# Patient Record
Sex: Female | Born: 1935
Health system: Southern US, Community
[De-identification: ages and names within clinical notes are randomized; demographics above are authoritative.]

## PROBLEM LIST (undated history)

## (undated) DIAGNOSIS — G309 Alzheimer's disease, unspecified: Secondary | ICD-10-CM

## (undated) DIAGNOSIS — I10 Essential (primary) hypertension: Secondary | ICD-10-CM

## (undated) DIAGNOSIS — C50919 Malignant neoplasm of unspecified site of unspecified female breast: Secondary | ICD-10-CM

## (undated) DIAGNOSIS — F0280 Dementia in other diseases classified elsewhere without behavioral disturbance: Secondary | ICD-10-CM

## (undated) DIAGNOSIS — I4819 Other persistent atrial fibrillation: Secondary | ICD-10-CM

## (undated) DIAGNOSIS — G459 Transient cerebral ischemic attack, unspecified: Secondary | ICD-10-CM

## (undated) HISTORY — PX: BREAST SURGERY: SHX581

## (undated) HISTORY — DX: Alzheimer's disease, unspecified: G30.9

## (undated) HISTORY — DX: Other persistent atrial fibrillation: I48.19

## (undated) HISTORY — DX: Dementia in other diseases classified elsewhere without behavioral disturbance: F02.80

## (undated) HISTORY — DX: Transient cerebral ischemic attack, unspecified: G45.9

## (undated) HISTORY — DX: Essential (primary) hypertension: I10

---

## 1993-07-06 HISTORY — PX: MASTECTOMY: SHX3

## 2007-07-07 DIAGNOSIS — G459 Transient cerebral ischemic attack, unspecified: Secondary | ICD-10-CM

## 2007-07-07 HISTORY — DX: Transient cerebral ischemic attack, unspecified: G45.9

## 2012-06-07 DIAGNOSIS — Z23 Encounter for immunization: Secondary | ICD-10-CM | POA: Diagnosis not present

## 2012-06-07 DIAGNOSIS — G609 Hereditary and idiopathic neuropathy, unspecified: Secondary | ICD-10-CM | POA: Diagnosis not present

## 2012-06-07 DIAGNOSIS — R030 Elevated blood-pressure reading, without diagnosis of hypertension: Secondary | ICD-10-CM | POA: Diagnosis not present

## 2012-06-08 DIAGNOSIS — R030 Elevated blood-pressure reading, without diagnosis of hypertension: Secondary | ICD-10-CM | POA: Diagnosis not present

## 2012-06-08 DIAGNOSIS — G609 Hereditary and idiopathic neuropathy, unspecified: Secondary | ICD-10-CM | POA: Diagnosis not present

## 2012-06-15 DIAGNOSIS — Z23 Encounter for immunization: Secondary | ICD-10-CM | POA: Diagnosis not present

## 2012-10-24 DIAGNOSIS — D539 Nutritional anemia, unspecified: Secondary | ICD-10-CM | POA: Diagnosis not present

## 2012-10-24 DIAGNOSIS — R29818 Other symptoms and signs involving the nervous system: Secondary | ICD-10-CM | POA: Diagnosis not present

## 2012-11-02 DIAGNOSIS — R29818 Other symptoms and signs involving the nervous system: Secondary | ICD-10-CM | POA: Diagnosis not present

## 2012-11-02 DIAGNOSIS — R262 Difficulty in walking, not elsewhere classified: Secondary | ICD-10-CM | POA: Diagnosis not present

## 2012-11-09 DIAGNOSIS — H04129 Dry eye syndrome of unspecified lacrimal gland: Secondary | ICD-10-CM | POA: Diagnosis not present

## 2012-11-09 DIAGNOSIS — R262 Difficulty in walking, not elsewhere classified: Secondary | ICD-10-CM | POA: Diagnosis not present

## 2012-11-09 DIAGNOSIS — R29818 Other symptoms and signs involving the nervous system: Secondary | ICD-10-CM | POA: Diagnosis not present

## 2012-11-11 DIAGNOSIS — R29818 Other symptoms and signs involving the nervous system: Secondary | ICD-10-CM | POA: Diagnosis not present

## 2012-11-11 DIAGNOSIS — R262 Difficulty in walking, not elsewhere classified: Secondary | ICD-10-CM | POA: Diagnosis not present

## 2012-11-16 DIAGNOSIS — R262 Difficulty in walking, not elsewhere classified: Secondary | ICD-10-CM | POA: Diagnosis not present

## 2012-11-16 DIAGNOSIS — R29818 Other symptoms and signs involving the nervous system: Secondary | ICD-10-CM | POA: Diagnosis not present

## 2012-11-18 DIAGNOSIS — R29818 Other symptoms and signs involving the nervous system: Secondary | ICD-10-CM | POA: Diagnosis not present

## 2012-11-18 DIAGNOSIS — R262 Difficulty in walking, not elsewhere classified: Secondary | ICD-10-CM | POA: Diagnosis not present

## 2012-11-30 DIAGNOSIS — R29818 Other symptoms and signs involving the nervous system: Secondary | ICD-10-CM | POA: Diagnosis not present

## 2012-11-30 DIAGNOSIS — R262 Difficulty in walking, not elsewhere classified: Secondary | ICD-10-CM | POA: Diagnosis not present

## 2012-12-02 DIAGNOSIS — R262 Difficulty in walking, not elsewhere classified: Secondary | ICD-10-CM | POA: Diagnosis not present

## 2012-12-02 DIAGNOSIS — R29818 Other symptoms and signs involving the nervous system: Secondary | ICD-10-CM | POA: Diagnosis not present

## 2012-12-07 DIAGNOSIS — R262 Difficulty in walking, not elsewhere classified: Secondary | ICD-10-CM | POA: Diagnosis not present

## 2012-12-07 DIAGNOSIS — R29818 Other symptoms and signs involving the nervous system: Secondary | ICD-10-CM | POA: Diagnosis not present

## 2012-12-09 DIAGNOSIS — R262 Difficulty in walking, not elsewhere classified: Secondary | ICD-10-CM | POA: Diagnosis not present

## 2012-12-09 DIAGNOSIS — R29818 Other symptoms and signs involving the nervous system: Secondary | ICD-10-CM | POA: Diagnosis not present

## 2012-12-21 DIAGNOSIS — R29818 Other symptoms and signs involving the nervous system: Secondary | ICD-10-CM | POA: Diagnosis not present

## 2012-12-21 DIAGNOSIS — R262 Difficulty in walking, not elsewhere classified: Secondary | ICD-10-CM | POA: Diagnosis not present

## 2012-12-23 DIAGNOSIS — R29818 Other symptoms and signs involving the nervous system: Secondary | ICD-10-CM | POA: Diagnosis not present

## 2012-12-23 DIAGNOSIS — R262 Difficulty in walking, not elsewhere classified: Secondary | ICD-10-CM | POA: Diagnosis not present

## 2012-12-27 DIAGNOSIS — Z1231 Encounter for screening mammogram for malignant neoplasm of breast: Secondary | ICD-10-CM | POA: Diagnosis not present

## 2012-12-28 DIAGNOSIS — R262 Difficulty in walking, not elsewhere classified: Secondary | ICD-10-CM | POA: Diagnosis not present

## 2012-12-28 DIAGNOSIS — R29818 Other symptoms and signs involving the nervous system: Secondary | ICD-10-CM | POA: Diagnosis not present

## 2012-12-30 DIAGNOSIS — R29818 Other symptoms and signs involving the nervous system: Secondary | ICD-10-CM | POA: Diagnosis not present

## 2012-12-30 DIAGNOSIS — R262 Difficulty in walking, not elsewhere classified: Secondary | ICD-10-CM | POA: Diagnosis not present

## 2013-01-04 DIAGNOSIS — R262 Difficulty in walking, not elsewhere classified: Secondary | ICD-10-CM | POA: Diagnosis not present

## 2013-01-04 DIAGNOSIS — R29818 Other symptoms and signs involving the nervous system: Secondary | ICD-10-CM | POA: Diagnosis not present

## 2013-01-11 DIAGNOSIS — R262 Difficulty in walking, not elsewhere classified: Secondary | ICD-10-CM | POA: Diagnosis not present

## 2013-01-11 DIAGNOSIS — R29818 Other symptoms and signs involving the nervous system: Secondary | ICD-10-CM | POA: Diagnosis not present

## 2013-01-18 DIAGNOSIS — R29818 Other symptoms and signs involving the nervous system: Secondary | ICD-10-CM | POA: Diagnosis not present

## 2013-01-18 DIAGNOSIS — R262 Difficulty in walking, not elsewhere classified: Secondary | ICD-10-CM | POA: Diagnosis not present

## 2013-01-20 DIAGNOSIS — R29818 Other symptoms and signs involving the nervous system: Secondary | ICD-10-CM | POA: Diagnosis not present

## 2013-01-20 DIAGNOSIS — R262 Difficulty in walking, not elsewhere classified: Secondary | ICD-10-CM | POA: Diagnosis not present

## 2013-03-21 DIAGNOSIS — Z23 Encounter for immunization: Secondary | ICD-10-CM | POA: Diagnosis not present

## 2013-08-16 DIAGNOSIS — IMO0002 Reserved for concepts with insufficient information to code with codable children: Secondary | ICD-10-CM | POA: Diagnosis not present

## 2013-08-21 DIAGNOSIS — R7989 Other specified abnormal findings of blood chemistry: Secondary | ICD-10-CM | POA: Diagnosis not present

## 2013-09-12 DIAGNOSIS — E871 Hypo-osmolality and hyponatremia: Secondary | ICD-10-CM | POA: Diagnosis not present

## 2014-04-10 DIAGNOSIS — Z1231 Encounter for screening mammogram for malignant neoplasm of breast: Secondary | ICD-10-CM | POA: Diagnosis not present

## 2014-04-10 DIAGNOSIS — Z853 Personal history of malignant neoplasm of breast: Secondary | ICD-10-CM | POA: Diagnosis not present

## 2014-04-20 DIAGNOSIS — Z23 Encounter for immunization: Secondary | ICD-10-CM | POA: Diagnosis not present

## 2014-11-14 DIAGNOSIS — H40023 Open angle with borderline findings, high risk, bilateral: Secondary | ICD-10-CM | POA: Diagnosis not present

## 2015-04-22 DIAGNOSIS — Z23 Encounter for immunization: Secondary | ICD-10-CM | POA: Diagnosis not present

## 2015-05-13 DIAGNOSIS — H40023 Open angle with borderline findings, high risk, bilateral: Secondary | ICD-10-CM | POA: Diagnosis not present

## 2015-10-30 DIAGNOSIS — B078 Other viral warts: Secondary | ICD-10-CM | POA: Diagnosis not present

## 2015-10-30 DIAGNOSIS — L821 Other seborrheic keratosis: Secondary | ICD-10-CM | POA: Diagnosis not present

## 2015-11-04 ENCOUNTER — Encounter (HOSPITAL_BASED_OUTPATIENT_CLINIC_OR_DEPARTMENT_OTHER): Payer: Self-pay | Admitting: *Deleted

## 2015-11-04 ENCOUNTER — Emergency Department (HOSPITAL_BASED_OUTPATIENT_CLINIC_OR_DEPARTMENT_OTHER): Payer: Medicare Other

## 2015-11-04 ENCOUNTER — Emergency Department (HOSPITAL_BASED_OUTPATIENT_CLINIC_OR_DEPARTMENT_OTHER)
Admission: EM | Admit: 2015-11-04 | Discharge: 2015-11-04 | Disposition: A | Discharge status: Home / Self Care | Payer: Medicare Other | Attending: Emergency Medicine | Admitting: Emergency Medicine

## 2015-11-04 DIAGNOSIS — Z23 Encounter for immunization: Secondary | ICD-10-CM | POA: Diagnosis not present

## 2015-11-04 DIAGNOSIS — S51812A Laceration without foreign body of left forearm, initial encounter: Secondary | ICD-10-CM | POA: Diagnosis present

## 2015-11-04 DIAGNOSIS — M79661 Pain in right lower leg: Secondary | ICD-10-CM | POA: Insufficient documentation

## 2015-11-04 DIAGNOSIS — M25552 Pain in left hip: Secondary | ICD-10-CM | POA: Diagnosis not present

## 2015-11-04 DIAGNOSIS — Z853 Personal history of malignant neoplasm of breast: Secondary | ICD-10-CM | POA: Diagnosis not present

## 2015-11-04 DIAGNOSIS — W1839XA Other fall on same level, initial encounter: Secondary | ICD-10-CM | POA: Diagnosis not present

## 2015-11-04 DIAGNOSIS — M25551 Pain in right hip: Secondary | ICD-10-CM | POA: Insufficient documentation

## 2015-11-04 DIAGNOSIS — S51012A Laceration without foreign body of left elbow, initial encounter: Secondary | ICD-10-CM | POA: Insufficient documentation

## 2015-11-04 DIAGNOSIS — R6889 Other general symptoms and signs: Secondary | ICD-10-CM | POA: Diagnosis not present

## 2015-11-04 DIAGNOSIS — M79662 Pain in left lower leg: Secondary | ICD-10-CM | POA: Diagnosis not present

## 2015-11-04 DIAGNOSIS — M7989 Other specified soft tissue disorders: Secondary | ICD-10-CM | POA: Diagnosis not present

## 2015-11-04 DIAGNOSIS — Z79899 Other long term (current) drug therapy: Secondary | ICD-10-CM | POA: Insufficient documentation

## 2015-11-04 DIAGNOSIS — M25522 Pain in left elbow: Secondary | ICD-10-CM | POA: Diagnosis not present

## 2015-11-04 DIAGNOSIS — S79911A Unspecified injury of right hip, initial encounter: Secondary | ICD-10-CM | POA: Diagnosis not present

## 2015-11-04 DIAGNOSIS — Z7982 Long term (current) use of aspirin: Secondary | ICD-10-CM | POA: Insufficient documentation

## 2015-11-04 DIAGNOSIS — Y929 Unspecified place or not applicable: Secondary | ICD-10-CM | POA: Diagnosis not present

## 2015-11-04 DIAGNOSIS — Y999 Unspecified external cause status: Secondary | ICD-10-CM | POA: Insufficient documentation

## 2015-11-04 DIAGNOSIS — Y939 Activity, unspecified: Secondary | ICD-10-CM | POA: Diagnosis not present

## 2015-11-04 DIAGNOSIS — T1490 Injury, unspecified: Secondary | ICD-10-CM | POA: Diagnosis not present

## 2015-11-04 DIAGNOSIS — W19XXXA Unspecified fall, initial encounter: Secondary | ICD-10-CM

## 2015-11-04 DIAGNOSIS — S79912A Unspecified injury of left hip, initial encounter: Secondary | ICD-10-CM | POA: Diagnosis not present

## 2015-11-04 HISTORY — DX: Malignant neoplasm of unspecified site of unspecified female breast: C50.919

## 2015-11-04 MED ORDER — TRAMADOL HCL 50 MG PO TABS: 50.0000 mg | ORAL_TABLET | Freq: Four times a day (QID) | ORAL | Status: DC | PRN

## 2015-11-04 MED ORDER — LIDOCAINE-EPINEPHRINE 2 %-1:100000 IJ SOLN
20.0000 mL | Freq: Once | INTRAMUSCULAR | Status: AC
  Administered 2015-11-04: 20 mL
  Filled 2015-11-04: qty 1

## 2015-11-04 MED ORDER — TETANUS-DIPHTH-ACELL PERTUSSIS 5-2.5-18.5 LF-MCG/0.5 IM SUSP
0.5000 mL | Freq: Once | INTRAMUSCULAR | Status: AC
  Administered 2015-11-04: 0.5 mL via INTRAMUSCULAR
  Filled 2015-11-04: qty 0.5

## 2015-11-04 NOTE — ED Notes (Signed)
MD at bedside. 

## 2015-11-04 NOTE — ED Notes (Signed)
Pt. returned from XR. 

## 2015-11-04 NOTE — ED Notes (Signed)
Pt given crackers and OJ per ok from MD [facility]

## 2015-11-04 NOTE — ED Provider Notes (Signed)
CSN: [id number]     Arrival date & time 11/04/15  1608 History  By signing my name below, I, Nichole [name], attest that this documentation has been prepared under the direction and in the presence of physician practitioner, Fredia Sorrow, MD,. Electronically Signed: Dora [name], Scribe. 11/04/2015. 5:21 PM.    Chief Complaint  Patient presents with  . Fall    Patient is a 82 y.o. female presenting with fall. The history is provided by the patient. No language interpreter was used.  Fall This is a new problem. The current episode started 3 to 5 hours ago. The problem has not changed since onset.Pertinent negatives include no chest pain, no abdominal pain, no headaches and no shortness of breath. She has tried nothing for the symptoms.     HPI Comments: Nichole [name] is a 82 y.o. female brought in by EMS with no significant PMHx who presents to the Emergency Department complaining of left forearm laceration sustained s/p falling x3 approximately 4 hours ago. She notes that she initially fell when her right leg "gave out" on her after standing up out of a chair. She denies hearing cracks or pops in her right leg during the fall and was able to crawl afterwards. Pt attempted to stand up after crawling and fell again due to weakness of her bilateral legs. She then crawled to her chair and attempted to pull herself into the chair; she fell once again and sustained a laceration to her left forearm after striking it against a coffee table. Pt did not hit her head or lose consciousness. She endorses experiencing bilateral hip pain since the falls. She has also been experiencing RLE pain for the last 8 days and has been taking Aleve with no relief; this pain was not exacerbated by the falls. She also notes that she has recently been experiencing intermittent shooting pain down her anterior bilateral legs; she endorses difficulty sleeping recently due to these pains. She does not use blood thinners. Pt denies  numbness or any other pains at this time.  Past Medical History  Diagnosis Date  . Breast [facility])    Past Surgical History  Procedure Laterality Date  . Breast surgery     History reviewed. No pertinent family history. Social History  Substance Use Topics  . Smoking status: Never Smoker   . Smokeless tobacco: None  . Alcohol Use: No   OB History    No data available     Review of Systems  Constitutional: Negative for fever and chills.  HENT: Negative for rhinorrhea and sore throat.   Eyes: Positive for visual disturbance (blurred vision).  Respiratory: Negative for cough and shortness of breath.   Cardiovascular: Negative for chest pain.  Gastrointestinal: Negative for nausea, vomiting, abdominal pain and diarrhea.  Genitourinary: Negative for dysuria.  Musculoskeletal: Positive for joint swelling (bilateral ankles) and arthralgias (RLE). Negative for back pain and neck pain.  Skin: Positive for wound (left forearm). Negative for rash.  Neurological: Positive for weakness. Negative for syncope, numbness and headaches.  Hematological: Does not bruise/bleed easily.  Psychiatric/Behavioral: Negative for confusion.      Allergies  Review of patient's allergies indicates no known allergies.  Home Medications   Prior to Admission medications   Medication Sig Start Date End Date Taking? Authorizing Provider  aspirin 81 MG chewable tablet Chew by mouth daily.   Yes Historical Provider, MD  co-enzyme Q-10 30 MG capsule Take 30 mg by mouth 3 (three) times daily.  Yes Historical Provider, MD  Omega-3 Fatty Acids (FISH OIL) 1000 MG CAPS Take by mouth.   Yes Historical Provider, MD   BP 183/65 mmHg  Pulse 87  Temp(Src) 98.5 F (36.9 C) (Oral)  Resp 18  Ht 5\' 5"  (1.651 m)  Wt 64.411 kg  BMI 23.63 kg/m2  SpO2 100% Physical Exam  Constitutional: She is oriented to person, place, and time. She appears well-developed and well-nourished. No distress.  HENT:  Head:  Normocephalic and atraumatic.  Mouth/Throat: Mucous membranes are normal.  Eyes: Conjunctivae and EOM are normal. Pupils are equal, round, and reactive to light.  Sclera are clear. Eyes track normally.  Neck: Neck supple. No tracheal deviation present.  Cardiovascular: Normal rate, regular rhythm and normal heart sounds.   Pulmonary/Chest: Effort normal and breath sounds normal. No respiratory distress.  Abdominal: Soft. Bowel sounds are normal. There is no tenderness.  Musculoskeletal: Normal range of motion.       Right ankle: She exhibits swelling.       Left ankle: She exhibits swelling.  Equal trace edema to bilateral ankles. Some numbness to the big toe on the right. Radial pulse on the left wrist is 2+. Left forearm: 4 cm c-shaped laceration through the dermis into the subcutaneous tissue.  Neurological: She is alert and oriented to person, place, and time.  Skin: Skin is warm and dry.  Psychiatric: She has a normal mood and affect. Her behavior is normal.  Nursing note and vitals reviewed.   ED Course  Procedures (including critical care time)  DIAGNOSTIC STUDIES: Oxygen Saturation is 99% on RA, normal by my interpretation.    COORDINATION OF CARE: 5:21 PM Discussed treatment plan with pt at bedside and pt agreed to plan.  Medications  Tdap (BOOSTRIX) injection 0.5 mL (0.5 mLs Intramuscular Given 11/04/15 1620)  lidocaine-EPINEPHrine (XYLOCAINE W/EPI) 2 %-1:100000 (with pres) injection 20 mL (20 mLs Infiltration Given by Other 11/04/15 1951)   LACERATION REPAIR Performed by: Fredia Sorrow Authorized by: Fredia Sorrow Consent: Verbal consent obtained. Risks and benefits: risks, benefits and alternatives were discussed Consent given by: patient Patient identity confirmed: provided demographic data Prepped and Draped in normal sterile fashion Wound explored  Laceration Location: Left elbow  Laceration Length: 4 cm  No Foreign Bodies seen or  palpated  Anesthesia: local infiltration  Local anesthetic: lidocaine 2 % with epinephrine  Anesthetic total: 2.5 ml  Irrigation method: syringe Amount of cleaning: standard  Skin closure: 3-0 Prolene   Number of sutures: 4   Technique: Simple interrupted   Patient tolerance: Patient tolerated the procedure well with no immediate complications.  No results found for this or any previous visit. Dg Elbow Complete Left  11/04/2015  CLINICAL DATA:  Pain following fall EXAM: LEFT ELBOW - COMPLETE 3+ VIEW COMPARISON:  None. FINDINGS: Frontal, lateral, and bilateral oblique views were obtained. There is soft tissue swelling with evidence of soft tissue injury anterior and posterior to the elbow joint. No radiopaque foreign body evident. There is no appreciable fracture or dislocation. No appreciable joint effusion. No appreciable joint space narrowing or erosion identified. IMPRESSION: Soft tissue swelling with evidence of soft tissue injury in the elbow region. No radiopaque foreign body. No fracture or dislocation. No apparent arthropathy. Electronically Signed   By: Lowella Grip III M.D.   On: 11/04/2015 18:52   Dg Hips Bilat With Pelvis 3-4 Views  11/04/2015  CLINICAL DATA:  Pain following fall EXAM: DG HIP (WITH OR WITHOUT PELVIS) 3-4V BILAT COMPARISON:  None.  FINDINGS: Frontal pelvis as well as frontal and lateral views of each hip-total five views -obtained. There is no appreciable fracture or dislocation. There is slight symmetric narrowing of both hip joints. There is degenerative change in the lower lumbar region with lower lumbar dextroscoliosis. No erosive change. IMPRESSION: Areas of osteoarthritic change.  No fracture or spondylolisthesis. Electronically Signed   By: Lowella Grip III M.D.   On: 11/04/2015 18:56      EKG Interpretation None      MDM   Final diagnoses:  Fall, initial encounter  Elbow laceration, left, initial encounter   Patient with fall from  standing position at home. No loss of consciousness no syncope. Patient complained of bilateral hip pain. Patient's been having pain in both hips for a number of days and pain in her low back for a long period of time. Increased pain of recent in the right hip anterior thigh area. Patient fell today at least the 2 times, one of the falls resulted in a laceration to her left elbow area. More of a deep skin tear. Patient without complaint of head pain neck pain no other other injuries.  I personally performed the services described in this documentation, which was scribed in my presence. The recorded information has been reviewed and is accurate.     Fredia Sorrow, MD 11/04/15 2012

## 2015-11-04 NOTE — Discharge Instructions (Signed)

## 2015-11-04 NOTE — ED Notes (Addendum)
Pt brought in by ems form home for fall pt c/o right hip and leg pain , also lac to left arm

## 2015-11-12 DIAGNOSIS — Z4802 Encounter for removal of sutures: Secondary | ICD-10-CM | POA: Diagnosis not present

## 2015-11-12 DIAGNOSIS — R296 Repeated falls: Secondary | ICD-10-CM | POA: Diagnosis not present

## 2015-11-12 DIAGNOSIS — R5381 Other malaise: Secondary | ICD-10-CM | POA: Diagnosis not present

## 2015-11-13 ENCOUNTER — Emergency Department (HOSPITAL_BASED_OUTPATIENT_CLINIC_OR_DEPARTMENT_OTHER): Admission: EM | Admit: 2015-11-13 | Discharge: 2015-11-13 | Discharge status: Absent without leave | Payer: Self-pay

## 2015-11-13 ENCOUNTER — Encounter (HOSPITAL_BASED_OUTPATIENT_CLINIC_OR_DEPARTMENT_OTHER): Payer: Self-pay | Admitting: Emergency Medicine

## 2015-11-13 ENCOUNTER — Inpatient Hospital Stay (HOSPITAL_BASED_OUTPATIENT_CLINIC_OR_DEPARTMENT_OTHER)
Admission: EM | Admit: 2015-11-13 | Discharge: 2015-11-17 | DRG: 643 | Disposition: A | Discharge status: Home Health | Payer: Medicare Other | Attending: Internal Medicine | Admitting: Internal Medicine

## 2015-11-13 DIAGNOSIS — G9341 Metabolic encephalopathy: Secondary | ICD-10-CM | POA: Diagnosis present

## 2015-11-13 DIAGNOSIS — E871 Hypo-osmolality and hyponatremia: Secondary | ICD-10-CM

## 2015-11-13 DIAGNOSIS — Z8041 Family history of malignant neoplasm of ovary: Secondary | ICD-10-CM

## 2015-11-13 DIAGNOSIS — Z7982 Long term (current) use of aspirin: Secondary | ICD-10-CM | POA: Diagnosis not present

## 2015-11-13 DIAGNOSIS — I10 Essential (primary) hypertension: Secondary | ICD-10-CM | POA: Diagnosis not present

## 2015-11-13 DIAGNOSIS — T8130XA Disruption of wound, unspecified, initial encounter: Secondary | ICD-10-CM | POA: Diagnosis not present

## 2015-11-13 DIAGNOSIS — R531 Weakness: Secondary | ICD-10-CM | POA: Diagnosis not present

## 2015-11-13 DIAGNOSIS — E222 Syndrome of inappropriate secretion of antidiuretic hormone: Secondary | ICD-10-CM | POA: Insufficient documentation

## 2015-11-13 DIAGNOSIS — Z853 Personal history of malignant neoplasm of breast: Secondary | ICD-10-CM

## 2015-11-13 DIAGNOSIS — T8131XA Disruption of external operation (surgical) wound, not elsewhere classified, initial encounter: Secondary | ICD-10-CM

## 2015-11-13 DIAGNOSIS — Y848 Other medical procedures as the cause of abnormal reaction of the patient, or of later complication, without mention of misadventure at the time of the procedure: Secondary | ICD-10-CM | POA: Diagnosis present

## 2015-11-13 LAB — URINALYSIS, ROUTINE W REFLEX MICROSCOPIC
BILIRUBIN URINE: NEGATIVE
Glucose, UA: NEGATIVE mg/dL
Ketones, ur: 15 mg/dL — AB
Leukocytes, UA: NEGATIVE
NITRITE: NEGATIVE
PROTEIN: NEGATIVE mg/dL
SPECIFIC GRAVITY, URINE: 1.014 (ref 1.005–1.030)
pH: 6.5 (ref 5.0–8.0)

## 2015-11-13 LAB — CBC WITH DIFFERENTIAL/PLATELET
BASOS ABS: 0.1 10*3/uL (ref 0.0–0.1)
Basophils Relative: 1 %
EOS PCT: 2 %
Eosinophils Absolute: 0.1 10*3/uL (ref 0.0–0.7)
HEMATOCRIT: 35.8 % — AB (ref 36.0–46.0)
Hemoglobin: 12.8 g/dL (ref 12.0–15.0)
LYMPHS ABS: 1.3 10*3/uL (ref 0.7–4.0)
LYMPHS PCT: 22 %
MCH: 32.2 pg (ref 26.0–34.0)
MCHC: 35.8 g/dL (ref 30.0–36.0)
MCV: 89.9 fL (ref 78.0–100.0)
MONO ABS: 0.8 10*3/uL (ref 0.1–1.0)
MONOS PCT: 13 %
NEUTROS ABS: 3.8 10*3/uL (ref 1.7–7.7)
Neutrophils Relative %: 62 %
PLATELETS: 252 10*3/uL (ref 150–400)
RBC: 3.98 MIL/uL (ref 3.87–5.11)
RDW: 11.7 % (ref 11.5–15.5)
WBC: 6.1 10*3/uL (ref 4.0–10.5)

## 2015-11-13 LAB — COMPREHENSIVE METABOLIC PANEL
ALT: 17 U/L (ref 14–54)
AST: 24 U/L (ref 15–41)
Albumin: 3.7 g/dL (ref 3.5–5.0)
Alkaline Phosphatase: 75 U/L (ref 38–126)
Anion gap: 7 (ref 5–15)
BILIRUBIN TOTAL: 0.9 mg/dL (ref 0.3–1.2)
BUN: 14 mg/dL (ref 6–20)
CHLORIDE: 85 mmol/L — AB (ref 101–111)
CO2: 27 mmol/L (ref 22–32)
CREATININE: 0.83 mg/dL (ref 0.44–1.00)
Calcium: 9.1 mg/dL (ref 8.9–10.3)
GFR, EST NON AFRICAN AMERICAN: 60 mL/min — AB (ref >60)
Glucose, Bld: 121 mg/dL — ABNORMAL HIGH (ref 65–99)
POTASSIUM: 4.2 mmol/L (ref 3.5–5.1)
Sodium: 119 mmol/L — CL (ref 135–145)
TOTAL PROTEIN: 7.1 g/dL (ref 6.5–8.1)

## 2015-11-13 LAB — OSMOLALITY, URINE: OSMOLALITY UR: 400 mosm/kg (ref 300–900)

## 2015-11-13 LAB — URINE MICROSCOPIC-ADD ON

## 2015-11-13 LAB — CORTISOL: CORTISOL PLASMA: 14.6 ug/dL

## 2015-11-13 LAB — CREATININE, URINE, RANDOM: CREATININE, URINE: 120.57 mg/dL

## 2015-11-13 LAB — MAGNESIUM: MAGNESIUM: 1.8 mg/dL (ref 1.7–2.4)

## 2015-11-13 LAB — OSMOLALITY: Osmolality: 254 mOsm/kg — ABNORMAL LOW (ref 275–295)

## 2015-11-13 LAB — TSH: TSH: 1.706 u[IU]/mL (ref 0.350–4.500)

## 2015-11-13 LAB — SODIUM, URINE, RANDOM: SODIUM UR: 42 mmol/L

## 2015-11-13 LAB — PHOSPHORUS: Phosphorus: 2.8 mg/dL (ref 2.5–4.6)

## 2015-11-13 MED ORDER — TRAMADOL HCL 50 MG PO TABS
50.0000 mg | ORAL_TABLET | Freq: Once | ORAL | Status: AC
  Administered 2015-11-13: 50 mg via ORAL
  Filled 2015-11-13: qty 1

## 2015-11-13 MED ORDER — SODIUM CHLORIDE 0.9 % IV SOLN
Freq: Once | INTRAVENOUS | Status: AC
  Administered 2015-11-13: 18:00:00 via INTRAVENOUS

## 2015-11-13 MED ORDER — LIDOCAINE-EPINEPHRINE 2 %-1:100000 IJ SOLN
20.0000 mL | Freq: Once | INTRAMUSCULAR | Status: AC
  Administered 2015-11-13: 20 mL
  Filled 2015-11-13: qty 1

## 2015-11-13 NOTE — ED Provider Notes (Signed)
CSN: [id number]     Arrival date & time 11/13/15  1334 History   First MD Initiated Contact with Patient 11/13/15 1500     Chief Complaint  Patient presents with  . Abnormal Lab  . Extremity Laceration     (Consider location/radiation/quality/duration/timing/severity/associated sxs/prior Treatment) HPI  82 year old female who presents with wound check. She was seen in the ED 9 days ago for laceration to her left forearm due to a mechanical fall and a cut on the corner of a table. It was sutured. She was seen by her primary care doctor yesterday for suture removal, but the wound was noted to be reopened today.  She also had routine blood work that was performed yesterday and was told by her primary care doctor that she had a sodium derangement and was told to drink electrolyte solutions and avoid water. Family has noted that over the past week she has had more frequent falls, and she states weakness in her lower extremities. They state that she is occasionally forgetful, but this is much longer standing over the past year.  She has not had any fevers, chills, nausea or vomiting, diarrhea, abdominal pain, lower extremity edema, abdominal distention, difficulty breathing, cough, or urinary complaints.   Past Medical History  Diagnosis Date  . Breast CA [facility])    Past Surgical History  Procedure Laterality Date  . Breast surgery     History reviewed. No pertinent family history. Social History  Substance Use Topics  . Smoking status: Never Smoker   . Smokeless tobacco: None  . Alcohol Use: No   OB History    No data available     Review of Systems 10/14 systems reviewed and are negative other than those stated in the HPI   Allergies  Review of patient's allergies indicates no known allergies.  Home Medications   Prior to Admission medications   Medication Sig Start Date End Date Taking? Authorizing Provider  aspirin 81 MG chewable tablet Chew by mouth daily.    Historical  Provider, MD  co-enzyme Q-10 30 MG capsule Take 30 mg by mouth 3 (three) times daily.    Historical Provider, MD  Omega-3 Fatty Acids (FISH OIL) 1000 MG CAPS Take by mouth.    Historical Provider, MD  traMADol (ULTRAM) 50 MG tablet Take 1 tablet (50 mg total) by mouth every 6 (six) hours as needed. 11/04/15   Fredia Sorrow, MD   BP 170/63 mmHg  Pulse 77  Temp(Src) 98.6 F (37 C) (Oral)  Resp 18  Ht 5\' 5"  (1.651 m)  Wt 142 lb (64.411 kg)  BMI 23.63 kg/m2  SpO2 100% Physical Exam Physical Exam  Nursing note and vitals reviewed. Constitutional: Well developed, well nourished, non-toxic, and in no acute distress Head: Normocephalic and atraumatic.  Mouth/Throat: Oropharynx is clear and moist.  Neck: Normal range of motion. Neck supple.  Cardiovascular: Normal rate and regular rhythm.   Pulmonary/Chest: Effort normal and breath sounds normal.  Abdominal: Soft. There is no tenderness. There is no rebound and no guarding.  Musculoskeletal: Normal range of motion.  Neurological: Alert, no facial droop, fluent speech, moves all extremities symmetrically Skin: Skin is warm and dry. 5 cm V shaped laceration just distal to elbow over the left forearm. Psychiatric: Cooperative  ED Course  .Marland KitchenLaceration Repair Date/Time: 11/13/2015 5:53 PM Performed by: Brantley Stage DUO Authorized by: Brantley Stage DUO Consent: Verbal consent obtained. Risks and benefits: risks, benefits and alternatives were discussed Consent given by: patient  Patient identity confirmed: verbally with patient Time out: Immediately prior to procedure a "time out" was called to verify the correct patient, procedure, equipment, support staff and site/side marked as required. Body area: upper extremity Location details: left lower arm Laceration length: 5 cm Foreign bodies: no foreign bodies Tendon involvement: none Nerve involvement: none Vascular damage: no Local anesthetic: lidocaine 2% with epinephrine Anesthetic total: 8  ml Patient sedated: no Preparation: Patient was prepped and draped in the usual sterile fashion. Irrigation solution: saline Irrigation method: syringe Amount of cleaning: standard Debridement: none Degree of undermining: none Skin closure: 4-0 nylon Number of sutures: 4 Technique: simple and complex (with corner stitch) Approximation: close Approximation difficulty: simple Dressing: 4x4 sterile gauze Patient tolerance: Patient tolerated the procedure well with no immediate complications   (including critical care time) Labs Review Labs Reviewed  CBC WITH DIFFERENTIAL/PLATELET - Abnormal; Notable for the following:    HCT 35.8 (*)    All other components within normal limits  COMPREHENSIVE METABOLIC PANEL - Abnormal; Notable for the following:    Sodium 119 (*)    Chloride 85 (*)    Glucose, Bld 121 (*)    GFR calc non Af Amer 60 (*)    All other components within normal limits  URINALYSIS, ROUTINE W REFLEX MICROSCOPIC (NOT AT [facility]) - Abnormal; Notable for the following:    APPearance CLOUDY (*)    Hgb urine dipstick MODERATE (*)    Ketones, ur 15 (*)    All other components within normal limits  URINE MICROSCOPIC-ADD ON - Abnormal; Notable for the following:    Squamous Epithelial / LPF 0-5 (*)    Bacteria, UA FEW (*)    Casts HYALINE CASTS (*)    All other components within normal limits  TSH  CORTISOL  OSMOLALITY  MAGNESIUM  PHOSPHORUS  SODIUM, URINE, RANDOM  CREATININE, URINE, RANDOM  OSMOLALITY, URINE    Imaging Review No results found. I have personally reviewed and evaluated these images and lab results as part of my medical decision-making.   EKG Interpretation None      MDM   Final diagnoses:  Hyponatremia  Wound dehiscence, initial encounter   82 year old female who presents with hyponatremia and wound dehiscence after recent suture removal. Vital signs stable. She is nontoxic in no acute distress. Alert, oriented, grossly neurologically  intact.  Wound dehisence after recent suture removal. Without overlying cellulitis. Resutured at bedside.   She has hyponatremia to 119 today. Spoke with PCP, Dr. Radene Ou. Yesterday sodium 122 and in February 2017 sodium was 130. Likely etiology of her weakness, frequent falls. Seems euvolemic on exam and by history. Start on NS @ 75 cc/hr. Discussed with Dr. Verlon Au. To admit at [facility] for management.  Forde Dandy, MD 11/13/15 1754

## 2015-11-13 NOTE — ED Notes (Signed)
RN unable to take report at this time 

## 2015-11-13 NOTE — ED Notes (Signed)
Eugene Garnet, RN and Dr. Rogene Houston alerted to pt sodium of 119.

## 2015-11-13 NOTE — ED Notes (Signed)
Patient reports generalized body pain which began 2 weeks ago.  Patient states she thinks this is related to high levels of sodium.  States that she was told by PCP to not drink water but to only drink gatorade.  Patient reports she has been doing this since March.

## 2015-11-13 NOTE — ED Notes (Signed)
Dr Viviana Simpler in room with patient now.

## 2015-11-13 NOTE — ED Notes (Signed)
Nurse first-pt stood from front seat of vehicle-sat in w/c-brought into ED [place] lobby via w/c

## 2015-11-13 NOTE — ED Notes (Signed)
Patient was told to come here also because she went to MD yesterday and she was found to have an elevated sodium

## 2015-11-13 NOTE — ED Notes (Signed)
Patient reports that she went to get the stiches out in her left arm yesterday after having them in x 7 days. The patient reports that she was dressing her wound today and the wound is wide open.

## 2015-11-14 ENCOUNTER — Encounter (HOSPITAL_COMMUNITY): Payer: Self-pay | Admitting: Family Medicine

## 2015-11-14 ENCOUNTER — Observation Stay (HOSPITAL_COMMUNITY): Payer: Medicare Other

## 2015-11-14 DIAGNOSIS — E871 Hypo-osmolality and hyponatremia: Secondary | ICD-10-CM | POA: Diagnosis not present

## 2015-11-14 DIAGNOSIS — E222 Syndrome of inappropriate secretion of antidiuretic hormone: Secondary | ICD-10-CM | POA: Diagnosis not present

## 2015-11-14 LAB — BASIC METABOLIC PANEL
ANION GAP: 11 (ref 5–15)
ANION GAP: 11 (ref 5–15)
BUN: 8 mg/dL (ref 6–20)
BUN: 11 mg/dL (ref 6–20)
CALCIUM: 9.3 mg/dL (ref 8.9–10.3)
CALCIUM: 9.3 mg/dL (ref 8.9–10.3)
CHLORIDE: 85 mmol/L — AB (ref 101–111)
CO2: 24 mmol/L (ref 22–32)
CO2: 25 mmol/L (ref 22–32)
Chloride: 86 mmol/L — ABNORMAL LOW (ref 101–111)
Creatinine, Ser: 0.8 mg/dL (ref 0.44–1.00)
Creatinine, Ser: 0.81 mg/dL (ref 0.44–1.00)
GLUCOSE: 101 mg/dL — AB (ref 65–99)
GLUCOSE: 126 mg/dL — AB (ref 65–99)
POTASSIUM: 4.7 mmol/L (ref 3.5–5.1)
Potassium: 4 mmol/L (ref 3.5–5.1)
SODIUM: 122 mmol/L — AB (ref 135–145)
Sodium: 120 mmol/L — ABNORMAL LOW (ref 135–145)

## 2015-11-14 LAB — CBC
HCT: 36.3 % (ref 36.0–46.0)
HEMOGLOBIN: 12.9 g/dL (ref 12.0–15.0)
MCH: 32 pg (ref 26.0–34.0)
MCHC: 35.5 g/dL (ref 30.0–36.0)
MCV: 90.1 fL (ref 78.0–100.0)
Platelets: 247 10*3/uL (ref 150–400)
RBC: 4.03 MIL/uL (ref 3.87–5.11)
RDW: 12.3 % (ref 11.5–15.5)
WBC: 7.6 10*3/uL (ref 4.0–10.5)

## 2015-11-14 MED ORDER — ACETAMINOPHEN 325 MG PO TABS
650.0000 mg | ORAL_TABLET | Freq: Four times a day (QID) | ORAL | Status: DC | PRN
  Administered 2015-11-16: 650 mg via ORAL
  Filled 2015-11-14: qty 2

## 2015-11-14 MED ORDER — ASPIRIN 81 MG PO CHEW
81.0000 mg | CHEWABLE_TABLET | Freq: Every day | ORAL | Status: DC
  Administered 2015-11-14 – 2015-11-17 (×4): 81 mg via ORAL
  Filled 2015-11-14 (×4): qty 1

## 2015-11-14 MED ORDER — ENOXAPARIN SODIUM 30 MG/0.3ML ~~LOC~~ SOLN
30.0000 mg | SUBCUTANEOUS | Status: DC
  Filled 2015-11-14: qty 0.3

## 2015-11-14 MED ORDER — SODIUM CHLORIDE 1 G PO TABS
1.0000 g | ORAL_TABLET | Freq: Two times a day (BID) | ORAL | Status: DC
  Administered 2015-11-14: 1 g via ORAL
  Filled 2015-11-14 (×2): qty 1

## 2015-11-14 MED ORDER — ENOXAPARIN SODIUM 40 MG/0.4ML ~~LOC~~ SOLN
40.0000 mg | SUBCUTANEOUS | Status: DC
  Administered 2015-11-14 – 2015-11-17 (×4): 40 mg via SUBCUTANEOUS
  Filled 2015-11-14 (×3): qty 0.4

## 2015-11-14 MED ORDER — HYDRALAZINE HCL 20 MG/ML IJ SOLN
5.0000 mg | INTRAMUSCULAR | Status: DC | PRN
  Administered 2015-11-14 – 2015-11-16 (×3): 5 mg via INTRAVENOUS
  Filled 2015-11-14 (×3): qty 1

## 2015-11-14 MED ORDER — ACETAMINOPHEN 650 MG RE SUPP: 650.0000 mg | Freq: Four times a day (QID) | RECTAL | Status: DC | PRN

## 2015-11-14 NOTE — Progress Notes (Signed)
Agree with Dr. Loleta Books note Patient oriented pleasant has had multiple falls since 11/04/2015 Found to have low sodium at PCP office, sent to [facility] [place] and transferred over to [facility]  Lab work suggestive of SIADH/reset Osmo stat Patient tells me she drinks one large cup of coffee in the morning to [employer] but does not really drink much more It is unclear how much solid she actually takes in Her urine sodium was 40 her urine osmolality was 400, calculated serum osmolality was 254-normal range being 275-295. An specific gravity was 1.014  We will gently hydrate with normal saline 75 cc per hour, fluid restrict 1200 cc, add salt tablets twice a day, and monitor resolution She might require skilled nursing facility placement as she's had multiple falls resulting in laceration of the left arm with dehiscence Sutures will need to be taken out at PCP office or at skilled facility in about 10 days from index admission  Nichole Griffes, MD [facility] [phone]

## 2015-11-14 NOTE — H&P (Signed)
History and Physical  Patient Name: Nichole [name]     [id number]    DOB: Nov 13, 1922    DOA: 11/13/2015 PCP: Aretta Nip, MD   Patient coming from: Home  Chief Complaint: Low sodium  HPI: Nichole [name] is a 82 y.o. female with no significant past medical history who presents with low sodium and falls, mild confusion.  The patient lives alone, uses a walker, but is independent with all ADLs and IADLs at baseline, without dementia at baseline evidently.    The patient currently is a vague and confused historian, and I am primarily trusting EDP notes gathered with family present (not currently present) for history.  One week ago she fell, had a laceration and stitches to the LEFT forearm.  She has had increased unsteadiness and weakness recently, worst if trying to get up from a chair, as well as mental confusion slightly.  Within the last few days, she saw her PCP for suture removal, had some "routine" labwork that showed a low sodium, and then was told to come to the ER.  She has started no new medicines, only takes aspirin, CoQ10, and fish oil, no other prescriptions or OTC.  No recent cough, weight loss, hemoptysis.  No history of smoking.  No syncope, seizures, loss of consciousness, disorientation.  No history of thyroid disease.  In the ED, she was afebrile and hemodynamically stable.  Repeat Na 119.  K 4.2, Cr 0.8, WBC normal.  She was given normal saline at 75 cc/hr and transferred to [facility] for observation for hyponatremia.     Review of Systems:  Pt complains of gait unsteadiness, falls, weakness, some confusion. All other systems negative except as just noted or noted in the history of present illness.    Past Medical History  Diagnosis Date  . Breast CA [facility])     Past Surgical History  Procedure Laterality Date  . Breast surgery      Social History: Patient lives alone.  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  She  has a daughter in [place], moved here in 11-04-2009 after her husband died.  The patient walks with a walker.  Nonsmoker.    No Known Allergies  Family history: family history includes Ovarian cancer in her sister.  Prior to Admission medications   Medication Sig Start Date End Date Taking? Authorizing Provider  aspirin 81 MG chewable tablet Chew by mouth daily.    Historical Provider, MD  co-enzyme Q-10 30 MG capsule Take 30 mg by mouth 3 (three) times daily.    Historical Provider, MD  Omega-3 Fatty Acids (FISH OIL) 1000 MG CAPS Take by mouth.    Historical Provider, MD  traMADol (ULTRAM) 50 MG tablet Take 1 tablet (50 mg total) by mouth every 6 (six) hours as needed. 11/04/15   Fredia Sorrow, MD       Physical Exam: BP 178/68 mmHg  Pulse 73  Temp(Src) 97.8 F (36.6 C) (Oral)  Resp 16  Ht 5\' 5"  (1.651 m)  Wt 61.689 kg (136 lb)  BMI 22.63 kg/m2  SpO2 99% General appearance: Well-developed, elderly adult female, alert and in no acute distress.  Conversational, awake, and alert.   Eyes: Anicteric, conjunctiva pink, lids and lashes normal.     ENT: No nasal deformity, discharge, or epistaxis.  OP moist without lesions.   Lymph: No cervical or supraclavicular lymphadenopathy. Skin: Warm and dry.  No suspicious rashes or lesions on  face, neck, scalp, chest, abdomen, arms or legs.   Cardiac: RRR, nl S1-S2, no murmurs appreciated.  Capillary refill is brisk.  JVP not visible.  No LE edema.  Radial and DP pulses 2+ and symmetric. Respiratory: Normal respiratory rate and rhythm.  CTAB without rales or wheezes. Abdomen: Abdomen soft without rigidity.  No TTP. No ascites, distension.   MSK: No deformities or effusions. Neuro: Cranial nerves 3-12 intact.  Sensorium intact and responding to questions, attention normal.  No tremor.  DTRs at knees muted, but symmetric.  Speech is fluent.  Moves all extremities equally and with normal coordination and 5/5 strength.   FTN testing normal.  Oriented  to "hospital", but slow to state year or month.  Cannot identify phone, but can name shoe and pen.  Somewhat slow to follow commands and eye movements.    Psych: Behavior appropriate.  Affect appears normal.  No evidence of aural or visual hallucinations or delusions.       Labs on Admission:  I have personally reviewed following labs and imaging studies: CBC:  Recent Labs Lab 11/13/15 1410  WBC 6.1  NEUTROABS 3.8  HGB 12.8  HCT 35.8*  MCV 89.9  PLT [id number]   Basic Metabolic Panel:  Recent Labs Lab 11/13/15 1410 11/13/15 1411  NA 119*  --   K 4.2  --   CL 85*  --   CO2 27  --   GLUCOSE 121*  --   BUN 14  --   CREATININE 0.83  --   CALCIUM 9.1  --   MG  --  1.8  PHOS  --  2.8   GFR: Estimated Creatinine Clearance: 39.7 mL/min (by C-G formula based on Cr of 0.83). Liver Function Tests:  Recent Labs Lab 11/13/15 1410  AST 24  ALT 17  ALKPHOS 75  BILITOT 0.9  PROT 7.1  ALBUMIN 3.7   No results for input(s): LIPASE, AMYLASE in the last 168 hours. No results for input(s): AMMONIA in the last 168 hours. Coagulation Profile: No results for input(s): INR, PROTIME in the last 168 hours. Cardiac Enzymes: No results for input(s): CKTOTAL, CKMB, CKMBINDEX, TROPONINI in the last 168 hours. BNP (last 3 results) No results for input(s): PROBNP in the last 8760 hours. HbA1C: No results for input(s): HGBA1C in the last 72 hours. CBG: No results for input(s): GLUCAP in the last 168 hours. Lipid Profile: No results for input(s): CHOL, HDL, LDLCALC, TRIG, CHOLHDL, LDLDIRECT in the last 72 hours. Thyroid Function Tests:  Recent Labs  11/13/15 1605  TSH 1.706   Anemia Panel: No results for input(s): VITAMINB12, FOLATE, FERRITIN, TIBC, IRON, RETICCTPCT in the last 72 hours. Sepsis Labs: @LABRCNTIP (procalcitonin:4,lacticidven:4) )No results found for this or any previous visit (from the past 240 hour(s)).          Assessment/Plan 1. Hyponatremia:  Free water  clearance abnormal, urine concentrated, urine sodium inappropriately normal.  TSH normal.  Random cortisol >10.  No medications that could cause this.  Symptoms mild, unclear duration of symptoms, but presumably <48 hrs given fall 1 week ago.   -Fluid restriction to 1.2L daily -Will repeat BMP now and if sodium unchanged will start salt tabs to promote water excretion, consider vaptan -q12 hrs BMP -Obtain CXR      DVT prophylaxis: Lovenox  Code Status: FULL  Family Communication: None present  Disposition Plan: Anticipate observation overnight, trend BMP.  Depending on sodium level tomorrow, patient could be discharge within 24 hours from now  with fluid restriction and close PCP follow up Consults called: None Admission status: Observation, telemetry   Medical decision making: Patient seen at 1:57 AM on 11/14/2015. Clinical condition: stable.        Edwin Dada [facility] Pager [phone]

## 2015-11-15 DIAGNOSIS — R531 Weakness: Secondary | ICD-10-CM | POA: Diagnosis not present

## 2015-11-15 DIAGNOSIS — E871 Hypo-osmolality and hyponatremia: Secondary | ICD-10-CM | POA: Diagnosis not present

## 2015-11-15 DIAGNOSIS — Y848 Other medical procedures as the cause of abnormal reaction of the patient, or of later complication, without mention of misadventure at the time of the procedure: Secondary | ICD-10-CM | POA: Diagnosis present

## 2015-11-15 DIAGNOSIS — E222 Syndrome of inappropriate secretion of antidiuretic hormone: Secondary | ICD-10-CM | POA: Diagnosis not present

## 2015-11-15 DIAGNOSIS — Z7982 Long term (current) use of aspirin: Secondary | ICD-10-CM | POA: Diagnosis not present

## 2015-11-15 DIAGNOSIS — I10 Essential (primary) hypertension: Secondary | ICD-10-CM | POA: Diagnosis not present

## 2015-11-15 DIAGNOSIS — Z8041 Family history of malignant neoplasm of ovary: Secondary | ICD-10-CM | POA: Diagnosis not present

## 2015-11-15 DIAGNOSIS — Z853 Personal history of malignant neoplasm of breast: Secondary | ICD-10-CM | POA: Diagnosis not present

## 2015-11-15 DIAGNOSIS — G9341 Metabolic encephalopathy: Secondary | ICD-10-CM | POA: Diagnosis present

## 2015-11-15 DIAGNOSIS — T8130XA Disruption of wound, unspecified, initial encounter: Secondary | ICD-10-CM | POA: Diagnosis present

## 2015-11-15 LAB — BASIC METABOLIC PANEL
ANION GAP: 10 (ref 5–15)
ANION GAP: 13 (ref 5–15)
BUN: 10 mg/dL (ref 6–20)
BUN: 13 mg/dL (ref 6–20)
CALCIUM: 9.2 mg/dL (ref 8.9–10.3)
CALCIUM: 9.8 mg/dL (ref 8.9–10.3)
CO2: 25 mmol/L (ref 22–32)
CO2: 23 mmol/L (ref 22–32)
Chloride: 87 mmol/L — ABNORMAL LOW (ref 101–111)
Chloride: 86 mmol/L — ABNORMAL LOW (ref 101–111)
Creatinine, Ser: 0.75 mg/dL (ref 0.44–1.00)
Creatinine, Ser: 1 mg/dL (ref 0.44–1.00)
GFR calc non Af Amer: >60 mL/min (ref >60)
GFR, EST AFRICAN AMERICAN: 55 mL/min — AB (ref >60)
GFR, EST NON AFRICAN AMERICAN: 48 mL/min — AB (ref >60)
Glucose, Bld: 101 mg/dL — ABNORMAL HIGH (ref 65–99)
Glucose, Bld: 113 mg/dL — ABNORMAL HIGH (ref 65–99)
POTASSIUM: 4 mmol/L (ref 3.5–5.1)
POTASSIUM: 4 mmol/L (ref 3.5–5.1)
Sodium: 122 mmol/L — ABNORMAL LOW (ref 135–145)
Sodium: 122 mmol/L — ABNORMAL LOW (ref 135–145)

## 2015-11-15 LAB — SODIUM, URINE, RANDOM: SODIUM UR: 55 mmol/L

## 2015-11-15 LAB — OSMOLALITY, URINE: Osmolality, Ur: 535 mOsm/kg (ref 300–900)

## 2015-11-15 MED ORDER — SODIUM CHLORIDE 0.9 % IV SOLN
INTRAVENOUS | Status: DC
  Administered 2015-11-15 – 2015-11-16 (×2): via INTRAVENOUS

## 2015-11-15 MED ORDER — SODIUM CHLORIDE 1 G PO TABS
1.0000 g | ORAL_TABLET | Freq: Three times a day (TID) | ORAL | Status: DC
  Administered 2015-11-15 – 2015-11-17 (×7): 1 g via ORAL
  Filled 2015-11-15 (×8): qty 1

## 2015-11-15 NOTE — Evaluation (Addendum)
Physical Therapy Evaluation Patient Details Name: Nichole [name] MRN: [id number] DOB: August 23, 1922 Today's Date: 11/15/2015   History of Present Illness  82 y.o. female with no significant past medical history who presents with low sodium and falls, mild confusion  Clinical Impression  Patient demonstrates deficits in functional mobility as indicated below. Will benefit from continued skilled PT to address deficits and maximize function. Will see as indicated and progress as tolerated.  Spoke with son in law and patient regarding concerns for discharge. At this time, feel the most appropriate venue for discharge would be to return to home for which patient is familiar with and 24/7 assist. This appears to be the desire of family as well. If family unable to provide adequate level of assist then would need to consider ST SNF but this would be less then ideal given age and change for AMS with environmental change.    Follow Up Recommendations Home health PT;Supervision/Assistance - 24 hour;Supervision for mobility/OOB (if family unable to provide may need ST SNF)    Equipment Recommendations  Rolling walker with 5" wheels    Recommendations for Other Services       Precautions / Restrictions Precautions Precautions: Fall      Mobility  Bed Mobility Overal bed mobility: Needs Assistance Bed Mobility: Sit to Supine       Sit to supine: Min guard   General bed mobility comments: no physical assist required, min guard for safety  Transfers Overall transfer level: Needs assistance Equipment used: Rolling walker (2 wheeled) Transfers: Sit to/from Stand Sit to Stand: Min assist         General transfer comment: min assist to elevate to standing from low surface  Ambulation/Gait Ambulation/Gait assistance: Min assist Ambulation Distance (Feet): 160 Feet Assistive device: Rolling walker (2 wheeled) Gait Pattern/deviations: Step-through pattern;Decreased stride length;Decreased  dorsiflexion - right;Shuffle;Festinating;Drifts right/left;Narrow base of support Gait velocity: decreased Gait velocity interpretation: <1.8 ft/sec, indicative of risk for recurrent falls General Gait Details: VCs for upright posture and positioning, cues for increased candence. Instability noted despite use of assistive device. increased fall risk  Stairs            Wheelchair Mobility    Modified Rankin (Stroke Patients Only)       Balance Overall balance assessment: History of Falls                                           Pertinent Vitals/Pain Pain Assessment: Faces Faces Pain Scale: Hurts little more Pain Location: right arm Pain Descriptors / Indicators: Grimacing;Sore Pain Intervention(s): Monitored during session;Repositioned    Home Living Family/patient expects to be discharged to:: Private residence Living Arrangements: Alone Available Help at Discharge: Family Type of Home: House Home Access: Stairs to enter   Technical brewer of Steps: 1 Home Layout: One level Home Equipment: Shower seat - built in      Prior Function Level of Independence: Independent with assistive device(s)         Comments: past wk has been falling     Hand Dominance   Dominant Hand: Right    Extremity/Trunk Assessment   Upper Extremity Assessment: RUE deficits/detail RUE Deficits / Details: laceration right UE         Lower Extremity Assessment: Generalized weakness;RLE deficits/detail;LLE deficits/detail      Cervical / Trunk Assessment: Kyphotic  Communication   Communication: [facility]  Cognition Arousal/Alertness: Awake/alert Behavior During Therapy: WFL for tasks assessed/performed Overall Cognitive Status: History of cognitive impairments - at baseline (poor historian)                      General Comments      Exercises        Assessment/Plan    PT Assessment Patient needs continued PT services  PT Diagnosis  Difficulty walking;Abnormality of gait;Generalized weakness;Altered mental status   PT Problem List Decreased strength;Decreased activity tolerance;Decreased balance;Decreased mobility;Decreased coordination;Decreased cognition;Decreased safety awareness;Impaired sensation;Pain  PT Treatment Interventions DME instruction;Gait training;Functional mobility training;Therapeutic activities;Therapeutic exercise;Balance training;Patient/family education   PT Goals (Current goals can be found in the Care Plan section) Acute Rehab PT Goals Patient Stated Goal: to go home PT Goal Formulation: With patient/family Time For Goal Achievement: 11/29/15 Potential to Achieve Goals: Fair    Frequency Min 3X/week   Barriers to discharge Decreased caregiver support family reports grand daughter may be able to provide 24/7 assist    Co-evaluation               End of Session Equipment Utilized During Treatment: Gait belt Activity Tolerance: Patient limited by fatigue Patient left: in bed;with call bell/phone within reach;with bed alarm set;with family/visitor present Nurse Communication: Mobility status         Time: [id number] PT Time Calculation (min) (ACUTE ONLY): 20 min   Charges:   PT Evaluation $PT Eval Moderate Complexity: 1 Procedure     PT G Codes:   PT G-Codes **NOT FOR INPATIENT CLASS** Functional Assessment Tool Used: clinical judgement Functional Limitation: Mobility: Walking and moving around Mobility: Walking and Moving Around Current Status [id number]): At least 20 percent but less than 40 percent impaired, limited or restricted Mobility: Walking and Moving Around Goal Status [phone]): At least 1 percent but less than 20 percent impaired, limited or restricted    Duncan Dull 11/15/2015, 3:24 PM Alben Deeds, [place] DPT  [phone]

## 2015-11-15 NOTE — Care Management Obs Status (Signed)
[place] NOTIFICATION   Patient Details  Name: Nichole [name] MRN: [id number] Date of Birth: 1923/02/26   Medicare Observation Status Notification Given:  Yes    Zenon Mayo, RN 11/15/2015, 12:08 PM

## 2015-11-15 NOTE — Progress Notes (Signed)
PROGRESS NOTE    Nichole [name]  [id number] DOB: 24-Nov-1922 DOA: 11/13/2015 PCP: Aretta Nip, MD  Outpatient Specialists:   Brief Narrative:  82 y/o ?Recent ED visit 11/04/15  s/p sustaining multiple falls-had L forearm laceration which was sutured in ED Had numerous further falls-found to have Euvolemic hyponatremia by PCP who was attempting to manage this in the OP setting  Brought back from home to [facility] 11/14/15  Na 119. K 4.2, Cr 0.8, WBC normal. She was given normal saline at 75 cc/hr and transferred to [facility] for observation for hyponatremia.  Her urine sodium was 40 her urine osmolality was 400, calculated serum osmolality was 254-normal range being 275-295. Urine specific gravity was 1.014   Assessment & Plan:   Active Problems:   Hyponatremia Patient has reset osmole stat/SIADH picture because of urine studies -I started salt tablets 1 g 3 times a day 11/14/2015 but her sodium was only budged from 120-->122 I have repeated urine sodium, urine osmolality Please follow-up the results -Continue fluid restriction 1200 cc per day until sodium levels rise to about 127 and then can liberalize fluid intake and cut back salt tablets -Her metabolic encephalopathy, dizziness and altered state were probably all because of a low sodium and I would not work this up further -Had a long discussion with the patient's son-in-law at the bedside who understands -Physical therapy seen the patient and has recommended home health PT and rolling walker with 5 inch wheels   DVT prophylaxis: Lovenox Code Status: Full Family Communication: see above Disposition Plan: will need at least 48 hours for resolution of sodium levels it is resolving slowly   Consultants:   None ye  Procedures:   none  Antimicrobials:   none    Subjective:  sitting at bedside Drinking a litle No n/v No cp tol ~ 50% breakfast No blurred nor double vision Stand-by assist  Objective: Filed  Vitals:   11/14/15 1458 11/14/15 2232 11/15/15 0544 11/15/15 0645  BP: 139/48 155/53 190/64 144/41  Pulse: 80 78 75 80  Temp: 98 F (36.7 C) 97.8 F (36.6 C) 97.7 F (36.5 C)   TempSrc:  Oral    Resp: 18 18 18    Height:      Weight:      SpO2: 100% 98% 96%     Intake/Output Summary (Last 24 hours) at 11/15/15 1313 Last data filed at 11/15/15 0653  Gross per 24 hour  Intake    600 ml  Output    300 ml  Net    300 ml   Filed Weights   11/13/15 1341 11/13/15 2216  Weight: 64.411 kg (142 lb) 61.689 kg (136 lb)    Examination:  General exam: Appears calm and comfortable-oriented, NAD  Respiratory system: Clear to auscultation.  Cardiovascular system: S1 & S2 heard, RRR. No JVD, murmurs, rubs, gallops or clicks. No pedal edema. Gastrointestinal system: Abdomen is nondistended, soft and nontender. No organomegaly or masses felt. Normal bowel sounds heard. Central nervous system: Alert and oriented. No focal neurological deficits. Extremities: Symmetric 5 x 5 power. Skin: No rashes, lesions or ulcers Psychiatry: Judgement and insight appear normal. Mood & affect appropriate.     Data Reviewed: I have personally reviewed following labs and imaging studies  CBC:  Recent Labs Lab 11/13/15 1410 11/14/15 0248  WBC 6.1 7.6  NEUTROABS 3.8  --   HGB 12.8 12.9  HCT 35.8* 36.3  MCV 89.9 90.1  PLT 252 [id number]   Basic Metabolic  Panel:  Recent Labs Lab 11/13/15 1410 11/13/15 1411 11/14/15 0248 11/14/15 1350 11/15/15 0528  NA 119*  --  120* 122* 122*  K 4.2  --  4.0 4.7 4.0  CL 85*  --  85* 86* 87*  CO2 27  --  24 25 25   GLUCOSE 121*  --  101* 126* 101*  BUN 14  --  8 11 10   CREATININE 0.83  --  0.80 0.81 0.75  CALCIUM 9.1  --  9.3 9.3 9.2  MG  --  1.8  --   --   --   PHOS  --  2.8  --   --   --    GFR: Estimated Creatinine Clearance: 41.2 mL/min (by C-G formula based on Cr of 0.75). Liver Function Tests:  Recent Labs Lab 11/13/15 1410  AST 24  ALT 17    ALKPHOS 75  BILITOT 0.9  PROT 7.1  ALBUMIN 3.7   No results for input(s): LIPASE, AMYLASE in the last 168 hours. No results for input(s): AMMONIA in the last 168 hours. Coagulation Profile: No results for input(s): INR, PROTIME in the last 168 hours. Cardiac Enzymes: No results for input(s): CKTOTAL, CKMB, CKMBINDEX, TROPONINI in the last 168 hours. BNP (last 3 results) No results for input(s): PROBNP in the last 8760 hours. HbA1C: No results for input(s): HGBA1C in the last 72 hours. CBG: No results for input(s): GLUCAP in the last 168 hours. Lipid Profile: No results for input(s): CHOL, HDL, LDLCALC, TRIG, CHOLHDL, LDLDIRECT in the last 72 hours. Thyroid Function Tests:  Recent Labs  11/13/15 1605  TSH 1.706   Anemia Panel: No results for input(s): VITAMINB12, FOLATE, FERRITIN, TIBC, IRON, RETICCTPCT in the last 72 hours. Urine analysis:    Component Value Date/Time   COLORURINE YELLOW 11/13/2015 1545   APPEARANCEUR CLOUDY* 11/13/2015 1545   LABSPEC 1.014 11/13/2015 1545   PHURINE 6.5 11/13/2015 1545   GLUCOSEU NEGATIVE 11/13/2015 1545   HGBUR MODERATE* 11/13/2015 [place] 11/13/2015 1545   KETONESUR 15* 11/13/2015 1545   PROTEINUR NEGATIVE 11/13/2015 1545   NITRITE NEGATIVE 11/13/2015 1545   LEUKOCYTESUR NEGATIVE 11/13/2015 1545     Radiology Studies: Dg Chest 2 View  11/14/2015  CLINICAL DATA:  SIADH, body aches for the past 2 weeks, history of breast malignancy. EXAM: CHEST  2 VIEW COMPARISON:  None in PACs FINDINGS: The lungs are will well-expanded. There is no definite infiltrate there is density that projects over the lower thoracic spine on the lateral view only which does not appear to extend anterior to the anterior margins of the vertebral bodies. This likely reflects degenerative disc and endplate changes. There is no pleural effusion. The heart is normal in size. The pulmonary vascularity is not engorged. There is moderate  dextrocurvature centered at the thoracolumbar junction. The thoracic vertebral bodies are preserved in height. The bony structures are subjectively diffusely osteopenic. IMPRESSION: There is no pneumonia nor CHF. No pulmonary parenchymal masses or mediastinal or hilar lymphadenopathy are observed. Electronically Signed   By: David  Martinique M.D.   On: 11/14/2015 07:41        Scheduled Meds: . aspirin  81 mg Oral Daily  . enoxaparin (LOVENOX) injection  40 mg Subcutaneous Q24H  . sodium chloride  1 g Oral TID WC   Continuous Infusions:       Time spent:   [place], MD [facility] [phone]   If 7PM-7AM, please contact night-coverage [email or web address] Password  [email or web address] 11/15/2015, 1:13 PM

## 2015-11-16 DIAGNOSIS — I10 Essential (primary) hypertension: Secondary | ICD-10-CM

## 2015-11-16 LAB — BASIC METABOLIC PANEL
ANION GAP: 10 (ref 5–15)
ANION GAP: 11 (ref 5–15)
BUN: 13 mg/dL (ref 6–20)
BUN: 12 mg/dL (ref 6–20)
CALCIUM: 8.9 mg/dL (ref 8.9–10.3)
CALCIUM: 9.1 mg/dL (ref 8.9–10.3)
CHLORIDE: 93 mmol/L — AB (ref 101–111)
CO2: 24 mmol/L (ref 22–32)
CO2: 23 mmol/L (ref 22–32)
Chloride: 90 mmol/L — ABNORMAL LOW (ref 101–111)
Creatinine, Ser: 0.75 mg/dL (ref 0.44–1.00)
Creatinine, Ser: 0.73 mg/dL (ref 0.44–1.00)
GFR calc Af Amer: >60 mL/min (ref >60)
GFR calc Af Amer: >60 mL/min (ref >60)
GFR calc non Af Amer: >60 mL/min (ref >60)
GFR calc non Af Amer: >60 mL/min (ref >60)
GLUCOSE: 110 mg/dL — AB (ref 65–99)
GLUCOSE: 103 mg/dL — AB (ref 65–99)
POTASSIUM: 4 mmol/L (ref 3.5–5.1)
Potassium: 3.8 mmol/L (ref 3.5–5.1)
Sodium: 124 mmol/L — ABNORMAL LOW (ref 135–145)
Sodium: 127 mmol/L — ABNORMAL LOW (ref 135–145)

## 2015-11-16 NOTE — Progress Notes (Signed)
PROGRESS NOTE  Nichole [name] [id number] DOB: [birth year] DOA: 11/13/2015 PCP: Aretta Nip, MD   LOS: 1 day   Brief Narrative: 82 y.o. female with no significant past medical history who presents with low sodium and falls, mild confusion. The patient lives alone, uses a walker, but is independent with all ADLs and IADLs at baseline, without dementia at baseline evidently.   Assessment & Plan: Active Problems:   Hyponatremia   HTN (hypertension)   Hyponatremia - She has low serum osmolality however normal high urine osmolality which suggests SIADH - Was started on salt tablets on 5/11, which will need to cut back on these once her sodium normalizes - Was placed on IV fluids, given SIADH picture will discontinue today, and continue fluid restriction alone to 1200 mL per day - Her metabolic encephalopathy, dizziness and altered state were probably all because of hyponatremia - Normal cortisol at 14.6; normal TSH at 1.7  Essential HTN - on no treatment at home suspect high BP baseline - most recent BP 153/56, suspect baseline, hold on treatment for now   DVT prophylaxis: lovenox Code Status: Full Family Communication: no family bedside Disposition Plan: home when sodium better  Consultants:   None   Procedures:   None   Antimicrobials:  None    Subjective: - not feeling good today, cannot elaborate. States that she hurts "all over"  Objective: Filed Vitals:   11/15/15 1544 11/15/15 2134 11/16/15 0533 11/16/15 0647  BP: 137/48 133/46 183/58 153/56  Pulse: 81 79 78 86  Temp: 97.8 F (36.6 C) 98.2 F (36.8 C) 97.8 F (36.6 C)   TempSrc: Oral  Oral   Resp: 18 16 16    Height:      Weight:      SpO2: 100% 97% 97%     Intake/Output Summary (Last 24 hours) at 11/16/15 0822 Last data filed at 11/16/15 0603  Gross per 24 hour  Intake 1433.33 ml  Output      0 ml  Net 1433.33 ml   Filed Weights   11/13/15 1341 11/13/15 2216  Weight: 64.411 kg (142  lb) 61.689 kg (136 lb)    Examination: Constitutional: NAD Filed Vitals:   11/15/15 1544 11/15/15 2134 11/16/15 0533 11/16/15 0647  BP: 137/48 133/46 183/58 153/56  Pulse: 81 79 78 86  Temp: 97.8 F (36.6 C) 98.2 F (36.8 C) 97.8 F (36.6 C)   TempSrc: Oral  Oral   Resp: 18 16 16    Height:      Weight:      SpO2: 100% 97% 97%    Eyes: PERRL, lids and conjunctivae normal ENMT: Mucous membranes are moist. Respiratory: clear to auscultation bilaterally, no wheezing, no crackles. Normal respiratory effort. No accessory muscle use.  Cardiovascular: Regular rate and rhythm, 3/6 SEM No extremity edema. 2+ pedal pulses. No carotid bruits.  Abdomen: no tenderness. Bowel sounds positive.  Neurologic: non focal  Psychiatric: Normal judgment and insight. Alert and oriented x 3. Normal mood.    Data Reviewed: I have personally reviewed following labs and imaging studies  CBC:  Recent Labs Lab 11/13/15 1410 11/14/15 0248  WBC 6.1 7.6  NEUTROABS 3.8  --   HGB 12.8 12.9  HCT 35.8* 36.3  MCV 89.9 90.1  PLT 252 [id number]   Basic Metabolic Panel:  Recent Labs Lab 11/13/15 1411 11/14/15 0248 11/14/15 1350 11/15/15 0528 11/15/15 1329 11/16/15 0308  NA  --  120* 122* 122* 122* 124*  K  --  4.0  4.7 4.0 4.0 3.8  CL  --  85* 86* 87* 86* 90*  CO2  --  24 25 25 23 24   GLUCOSE  --  101* 126* 101* 113* 110*  BUN  --  8 11 10 13 13   CREATININE  --  0.80 0.81 0.75 1.00 0.75  CALCIUM  --  9.3 9.3 9.2 9.8 8.9  MG 1.8  --   --   --   --   --   PHOS 2.8  --   --   --   --   --    GFR: Estimated Creatinine Clearance: 41.2 mL/min (by C-G formula based on Cr of 0.75). Liver Function Tests:  Recent Labs Lab 11/13/15 1410  AST 24  ALT 17  ALKPHOS 75  BILITOT 0.9  PROT 7.1  ALBUMIN 3.7   No results for input(s): LIPASE, AMYLASE in the last 168 hours. No results for input(s): AMMONIA in the last 168 hours. Coagulation Profile: No results for input(s): INR, PROTIME in the last 168  hours. Cardiac Enzymes: No results for input(s): CKTOTAL, CKMB, CKMBINDEX, TROPONINI in the last 168 hours. BNP (last 3 results) No results for input(s): PROBNP in the last 8760 hours. HbA1C: No results for input(s): HGBA1C in the last 72 hours. CBG: No results for input(s): GLUCAP in the last 168 hours. Lipid Profile: No results for input(s): CHOL, HDL, LDLCALC, TRIG, CHOLHDL, LDLDIRECT in the last 72 hours. Thyroid Function Tests:  Recent Labs  11/13/15 1605  TSH 1.706   Anemia Panel: No results for input(s): VITAMINB12, FOLATE, FERRITIN, TIBC, IRON, RETICCTPCT in the last 72 hours. Urine analysis:    Component Value Date/Time   COLORURINE YELLOW 11/13/2015 1545   APPEARANCEUR CLOUDY* 11/13/2015 1545   LABSPEC 1.014 11/13/2015 1545   PHURINE 6.5 11/13/2015 1545   GLUCOSEU NEGATIVE 11/13/2015 1545   HGBUR MODERATE* 11/13/2015 1545   [place] 11/13/2015 1545   KETONESUR 15* 11/13/2015 1545   PROTEINUR NEGATIVE 11/13/2015 1545   NITRITE NEGATIVE 11/13/2015 1545   LEUKOCYTESUR NEGATIVE 11/13/2015 1545   Sepsis Labs: Invalid input(s): PROCALCITONIN, LACTICIDVEN  No results found for this or any previous visit (from the past 240 hour(s)).    Radiology Studies: No results found.   Scheduled Meds: . aspirin  81 mg Oral Daily  . enoxaparin (LOVENOX) injection  40 mg Subcutaneous Q24H  . sodium chloride  1 g Oral TID WC   Continuous Infusions: . sodium chloride 50 mL/hr at 11/15/15 1547   Marzetta Board, MD, PhD [facility] Pager [phone]  If 7PM-7AM, please contact night-coverage [email or web address] Password [email or web address] 11/16/2015, 8:22 AM

## 2015-11-17 DIAGNOSIS — E222 Syndrome of inappropriate secretion of antidiuretic hormone: Principal | ICD-10-CM

## 2015-11-17 LAB — BASIC METABOLIC PANEL
ANION GAP: 13 (ref 5–15)
BUN: 9 mg/dL (ref 6–20)
CHLORIDE: 94 mmol/L — AB (ref 101–111)
CO2: 21 mmol/L — ABNORMAL LOW (ref 22–32)
CREATININE: 0.87 mg/dL (ref 0.44–1.00)
Calcium: 9.4 mg/dL (ref 8.9–10.3)
GFR calc non Af Amer: 57 mL/min — ABNORMAL LOW (ref >60)
Glucose, Bld: 118 mg/dL — ABNORMAL HIGH (ref 65–99)
Potassium: 4.3 mmol/L (ref 3.5–5.1)
SODIUM: 128 mmol/L — AB (ref 135–145)

## 2015-11-17 LAB — CBC
HCT: 37.6 % (ref 36.0–46.0)
HEMOGLOBIN: 13.4 g/dL (ref 12.0–15.0)
MCH: 32.6 pg (ref 26.0–34.0)
MCHC: 35.6 g/dL (ref 30.0–36.0)
MCV: 91.5 fL (ref 78.0–100.0)
PLATELETS: 212 10*3/uL (ref 150–400)
RBC: 4.11 MIL/uL (ref 3.87–5.11)
RDW: 12.9 % (ref 11.5–15.5)
WBC: 6.1 10*3/uL (ref 4.0–10.5)

## 2015-11-17 MED ORDER — SODIUM CHLORIDE 1 G PO TABS: 1.0000 g | ORAL_TABLET | Freq: Two times a day (BID) | ORAL | Status: DC

## 2015-11-17 MED ORDER — TRAMADOL HCL 50 MG PO TABS: 50.0000 mg | ORAL_TABLET | Freq: Three times a day (TID) | ORAL | Status: DC | PRN

## 2015-11-17 NOTE — Care Management Note (Signed)
Case Management Note  Patient Details  Name: Fatimatou Keeffe MRN: [id number] Date of Birth: Jul 14, 1922  Subjective/Objective: 82 y.o. F admitted  11/13/2015 with multiple EDV due to falls. PT recommending HHPT which she has chosen to have [facility] provide. Reports she has RW at home. Also reports granddaughter is coming to stay with her.                 Action/Plan: Referred by phone to [facility] [place] for [facility] for HHPT, who acknowledges same.  No further CM needs at this time    Expected Discharge Date:                  Expected Discharge Plan:     In-House Referral:     Discharge planning Services  CM Consult  Post Acute Care Choice:  Durable Medical Equipment, Home Health Choice offered to:  Patient  DME Arranged:   (pt reports she already has RW at home) DME Agency:     HH Arranged:  PT [place] Agency:  [place]  Status of Service:  Completed, signed off  Medicare Important Message Given:    Date Medicare IM Given:    Medicare IM give by:    Date Additional Medicare IM Given:    Additional Medicare Important Message give by:     If discussed at [place] of Stay Meetings, dates discussed:    Additional Comments:  Delrae Sawyers, RN 11/17/2015, 3:19 PM

## 2015-11-17 NOTE — Discharge Instructions (Signed)
Syndrome of Inappropriate Antidiuretic Hormone Syndrome of inappropriate antidiuretic hormone (SIADH) is when excess antidiuretic hormone (ADH) is produced in the body. This hormone is produced in a part of the brain called the hypothalamus. ADH controls water levels in the body. When too much of the hormone is produced, this can cause problems such as low salt (sodium) levels in your blood and reduced urine secretion. The body retains too much water, which can cause various symptoms. Symptoms can range from mild to severe. SIADH is most common in people who are hospitalized. CAUSES  Several conditions can cause SIADH, including:  Heart failure.  Injured or diseased hypothalamus.  Infection or inflammation in the brain, such as meningitis or encephalitis.  Lung disease or cancer.  Tumor.  Psychosis.  Major surgery.  Stroke.  Guillain-Barre syndrome.  Medicines, including chemotherapy drugs, NSAIDs, and pain killers.  Abnormalities in thyroid or parathyroid hormones.  Human immunodeficiency virus (HIV).  Hereditary causes. RISK FACTORS  Increasing age.  Recent major surgery.  Hospitalization. SIGNS AND SYMPTOMS  Irritability.  Cramps.  Nausea or vomiting.  Mental changes, such as memory problems, aggressiveness, confusion, or hallucinations.  Seizure.  Coma. DIAGNOSIS  Diagnosis may include:  Medical history and physical exam.  Blood tests.  Urine tests.  Chest X-ray or brain scan. TREATMENT  Treatments can help reduce the symptoms of SIADH. Treatment may include the following:  Restriction of your fluid intake.  Saline given through an IV tube or oral salt tablets to raise sodium levels to normal.  Medicines to help eliminate water from the body (diuretics).  Medicines to help control sodium levels. Various treatments may also be done to manage the underlying cause of your SIADH. HOME CARE INSTRUCTIONS  Follow your health care provider's  instructions on restricting your intake of fluids.  Take medicines only as directed by your health care provider.  Keep all follow-up visits as directed by your health care provider. This is important. SEEK MEDICAL CARE IF:  You have cramping.  You are nauseous or vomiting.  You have mental changes, such as memory problems, aggressiveness, confusion, or hallucinations. SEEK IMMEDIATE MEDICAL CARE IF: You have a seizure or loss of consciousness.    This information is not intended to replace advice given to you by your health care provider. Make sure you discuss any questions you have with your health care provider.   Document Released: 01/17/2014 Document Reviewed: 01/17/2014 Elsevier Interactive Patient Education [employer].

## 2015-11-17 NOTE — Progress Notes (Signed)
NURSING PROGRESS NOTE  Nichole [name] [id number] Discharge Data: 11/17/2015 2:37 PM Attending Provider: Barton Dubois, MD [id number] R Rankins, MD     Rollene Fare to be D/C'd Home per MD order.  Discussed with the patient the After Visit Summary and all questions fully answered. All IV's discontinued with no bleeding noted. All belongings returned to patient for patient to take home.   Last Vital Signs:  Blood pressure 155/58, pulse 87, temperature 97.9 F (36.6 C), temperature source Oral, resp. rate 16, height 5\' 5"  (1.651 m), weight 61.689 kg (136 lb), SpO2 99 %.  Discharge Medication List   Medication List    TAKE these medications        aspirin 81 MG chewable tablet  Chew by mouth daily.     co-enzyme Q-10 30 MG capsule  Take 30 mg by mouth 3 (three) times daily.     Fish Oil 1000 MG Caps  Take by mouth.     sodium chloride 1 g tablet  Take 1 tablet (1 g total) by mouth 2 (two) times daily with a meal.     traMADol 50 MG tablet  Commonly known as:  ULTRAM  Take 1 tablet (50 mg total) by mouth every 8 (eight) hours as needed for severe pain.         Charolette Child, RN

## 2015-11-17 NOTE — Discharge Summary (Signed)
Physician Discharge Summary  Nichole [name] [id number] DOB: 1922-07-12 DOA: 11/13/2015  PCP: Aretta Nip, MD  Admit date: 11/13/2015 Discharge date: 11/17/2015  Time spent: 35 minutes  Recommendations for Outpatient Follow-up:  Repeat BMET to follow electrolytes and renal function  Please reassess BP and start antihypertensive regimen if needed -adjust/discotninue sodium tablets as appropriate/needed  Discharge Diagnoses:  Active Problems:   Hyponatremia   HTN (hypertension) SIADH  Discharge Condition: stable and improved. Discharge home with instructions to follow up with PCP in 1 week  Diet recommendation: ask to follow heart healthy diet   Filed Weights   11/13/15 1341 11/13/15 2216  Weight: 64.411 kg (142 lb) 61.689 kg (136 lb)    History of present illness:  As per Dr. Loleta Books H&P written on 11/13/15 82 y.o. female with no significant past medical history who presents with low sodium and falls, mild confusion. The patient lives alone, uses a walker, but is independent with all ADLs and IADLs at baseline, without dementia at baseline evidently.   The patient currently is a vague and confused historian, and I am primarily trusting EDP notes gathered with family present (not currently present) for history. One week ago she fell, had a laceration and stitches to the LEFT forearm. She has had increased unsteadiness and weakness recently, worst if trying to get up from a chair, as well as mental confusion slightly. Within the last few days, she saw her PCP for suture removal, had some "routine" labwork that showed a low sodium, and then was told to come to the ER.  She has started no new medicines, only takes aspirin, CoQ10, and fish oil, no other prescriptions or OTC. No recent cough, weight loss, hemoptysis. No history of smoking. No syncope, seizures, loss of consciousness, disorientation. No history of thyroid disease.  Hospital Course:  Hyponatremia due to  SIADH - She has low serum osmolality, however normal high urine osmolality which suggests SIADH - Was started on salt tablets on 5/11 -at discharge sodium 128 -will continue sodium tablets twice a day for the next week and continue with fluid restriction -repeat BMET during follow up visit and determine needs of further sodium tablets -Normal cortisol at 14.6; normal TSH at 1.7 -all her sym,ptoms resolved prior to discharge   Essential HTN - on no treatment at home  -reassess BP at follow up with PCP and decide/determine initiation of antihypertensive agents    Procedures:  See below for x-ray reports   Consultations:  None   Discharge Exam: Filed Vitals:   11/16/15 2232 11/17/15 0539  BP: 156/51 155/58  Pulse: 81 87  Temp: 97.9 F (36.6 C) 97.9 F (36.6 C)  Resp: 18 16    General: afebrile, feeling good and in no distress. Denies CP, SOB and palpitations  Cardiovascular: S1 and S2, no rubs or gallops; positive SEM Respiratory: good air movement, no wheezing, no crackles Abd: soft, NT, ND, positive BS Extremities: no edema, cyanosis or clubbing     Discharge Instructions   Discharge Instructions    Discharge instructions    Complete by:  As directed   Arrange follow up with PCP in 5-7 days Please maintain a fluid restriction of 1.2 L daily Take medications as prescribed  Check weight on daily basis     Increase activity slowly    Complete by:  As directed           Current Discharge Medication List    START taking these medications   Details  sodium chloride 1 g tablet Take 1 tablet (1 g total) by mouth 2 (two) times daily with a meal. Qty: 30 tablet, Refills: 0      CONTINUE these medications which have CHANGED   Details  traMADol (ULTRAM) 50 MG tablet Take 1 tablet (50 mg total) by mouth every 8 (eight) hours as needed for severe pain.      CONTINUE these medications which have NOT CHANGED   Details  aspirin 81 MG chewable tablet Chew by mouth  daily.    co-enzyme Q-10 30 MG capsule Take 30 mg by mouth 3 (three) times daily.    Omega-3 Fatty Acids (FISH OIL) 1000 MG CAPS Take by mouth.       No Known Allergies Follow-up Information    Follow up with Aretta Nip, MD. Schedule an appointment as soon as possible for a visit in 1 week.   Specialty:  Family Medicine   Contact information:   [place] [phone]        The results of significant diagnostics from this hospitalization (including imaging, microbiology, ancillary and laboratory) are listed below for reference.    Significant Diagnostic Studies: Dg Chest 2 View  11/14/2015  CLINICAL DATA:  SIADH, body aches for the past 2 weeks, history of breast malignancy. EXAM: CHEST  2 VIEW COMPARISON:  None in PACs FINDINGS: The lungs are will well-expanded. There is no definite infiltrate there is density that projects over the lower thoracic spine on the lateral view only which does not appear to extend anterior to the anterior margins of the vertebral bodies. This likely reflects degenerative disc and endplate changes. There is no pleural effusion. The heart is normal in size. The pulmonary vascularity is not engorged. There is moderate dextrocurvature centered at the thoracolumbar junction. The thoracic vertebral bodies are preserved in height. The bony structures are subjectively diffusely osteopenic. IMPRESSION: There is no pneumonia nor CHF. No pulmonary parenchymal masses or mediastinal or hilar lymphadenopathy are observed. Electronically Signed   By: David  Martinique M.D.   On: 11/14/2015 07:41   Dg Elbow Complete Left  11/04/2015  CLINICAL DATA:  Pain following fall EXAM: LEFT ELBOW - COMPLETE 3+ VIEW COMPARISON:  None. FINDINGS: Frontal, lateral, and bilateral oblique views were obtained. There is soft tissue swelling with evidence of soft tissue injury anterior and posterior to the elbow joint. No radiopaque foreign body evident. There  is no appreciable fracture or dislocation. No appreciable joint effusion. No appreciable joint space narrowing or erosion identified. IMPRESSION: Soft tissue swelling with evidence of soft tissue injury in the elbow region. No radiopaque foreign body. No fracture or dislocation. No apparent arthropathy. Electronically Signed   By: Lowella Grip III M.D.   On: 11/04/2015 18:52   Dg Hips Bilat With Pelvis 3-4 Views  11/04/2015  CLINICAL DATA:  Pain following fall EXAM: DG HIP (WITH OR WITHOUT PELVIS) 3-4V BILAT COMPARISON:  None. FINDINGS: Frontal pelvis as well as frontal and lateral views of each hip-total five views -obtained. There is no appreciable fracture or dislocation. There is slight symmetric narrowing of both hip joints. There is degenerative change in the lower lumbar region with lower lumbar dextroscoliosis. No erosive change. IMPRESSION: Areas of osteoarthritic change.  No fracture or spondylolisthesis. Electronically Signed   By: Lowella Grip III M.D.   On: 11/04/2015 18:56    Labs: Basic Metabolic Panel:  Recent Labs Lab 11/13/15 1411  11/15/15 0528 11/15/15 1329 11/16/15 0308 11/16/15  1345 11/17/15 1029  NA  --   < > 122* 122* 124* 127* 128*  K  --   < > 4.0 4.0 3.8 4.0 4.3  CL  --   < > 87* 86* 90* 93* 94*  CO2  --   < > 25 23 24 23  21*  GLUCOSE  --   < > 101* 113* 110* 103* 118*  BUN  --   < > 10 13 13 12 9   CREATININE  --   < > 0.75 1.00 0.75 0.73 0.87  CALCIUM  --   < > 9.2 9.8 8.9 9.1 9.4  MG 1.8  --   --   --   --   --   --   PHOS 2.8  --   --   --   --   --   --   < > = values in this interval not displayed.   Liver Function Tests:  Recent Labs Lab 11/13/15 1410  AST 24  ALT 17  ALKPHOS 75  BILITOT 0.9  PROT 7.1  ALBUMIN 3.7   CBC:  Recent Labs Lab 11/13/15 1410 11/14/15 0248 11/17/15 1029  WBC 6.1 7.6 6.1  NEUTROABS 3.8  --   --   HGB 12.8 12.9 13.4  HCT 35.8* 36.3 37.6  MCV 89.9 90.1 91.5  PLT 252 247 212    Signed:  Barton Dubois MD.  [facility] 11/17/2015, 2:09 PM

## 2015-11-19 DIAGNOSIS — E222 Syndrome of inappropriate secretion of antidiuretic hormone: Secondary | ICD-10-CM | POA: Diagnosis not present

## 2015-11-19 DIAGNOSIS — I10 Essential (primary) hypertension: Secondary | ICD-10-CM | POA: Diagnosis not present

## 2015-11-19 DIAGNOSIS — Z7982 Long term (current) use of aspirin: Secondary | ICD-10-CM | POA: Diagnosis not present

## 2015-11-19 DIAGNOSIS — Z853 Personal history of malignant neoplasm of breast: Secondary | ICD-10-CM | POA: Diagnosis not present

## 2015-11-19 DIAGNOSIS — S51002D Unspecified open wound of left elbow, subsequent encounter: Secondary | ICD-10-CM | POA: Diagnosis not present

## 2015-11-21 DIAGNOSIS — Z7982 Long term (current) use of aspirin: Secondary | ICD-10-CM | POA: Diagnosis not present

## 2015-11-21 DIAGNOSIS — I10 Essential (primary) hypertension: Secondary | ICD-10-CM | POA: Diagnosis not present

## 2015-11-21 DIAGNOSIS — Z5189 Encounter for other specified aftercare: Secondary | ICD-10-CM | POA: Diagnosis not present

## 2015-11-21 DIAGNOSIS — Z853 Personal history of malignant neoplasm of breast: Secondary | ICD-10-CM | POA: Diagnosis not present

## 2015-11-21 DIAGNOSIS — E871 Hypo-osmolality and hyponatremia: Secondary | ICD-10-CM | POA: Diagnosis not present

## 2015-11-21 DIAGNOSIS — E222 Syndrome of inappropriate secretion of antidiuretic hormone: Secondary | ICD-10-CM | POA: Diagnosis not present

## 2015-11-21 DIAGNOSIS — R5381 Other malaise: Secondary | ICD-10-CM | POA: Diagnosis not present

## 2015-11-21 DIAGNOSIS — S51002D Unspecified open wound of left elbow, subsequent encounter: Secondary | ICD-10-CM | POA: Diagnosis not present

## 2015-11-22 DIAGNOSIS — E222 Syndrome of inappropriate secretion of antidiuretic hormone: Secondary | ICD-10-CM | POA: Diagnosis not present

## 2015-11-22 DIAGNOSIS — Z853 Personal history of malignant neoplasm of breast: Secondary | ICD-10-CM | POA: Diagnosis not present

## 2015-11-22 DIAGNOSIS — S51002D Unspecified open wound of left elbow, subsequent encounter: Secondary | ICD-10-CM | POA: Diagnosis not present

## 2015-11-22 DIAGNOSIS — I10 Essential (primary) hypertension: Secondary | ICD-10-CM | POA: Diagnosis not present

## 2015-11-22 DIAGNOSIS — Z7982 Long term (current) use of aspirin: Secondary | ICD-10-CM | POA: Diagnosis not present

## 2015-11-25 DIAGNOSIS — I10 Essential (primary) hypertension: Secondary | ICD-10-CM | POA: Diagnosis not present

## 2015-11-25 DIAGNOSIS — Z853 Personal history of malignant neoplasm of breast: Secondary | ICD-10-CM | POA: Diagnosis not present

## 2015-11-25 DIAGNOSIS — S51002D Unspecified open wound of left elbow, subsequent encounter: Secondary | ICD-10-CM | POA: Diagnosis not present

## 2015-11-25 DIAGNOSIS — Z7982 Long term (current) use of aspirin: Secondary | ICD-10-CM | POA: Diagnosis not present

## 2015-11-25 DIAGNOSIS — E222 Syndrome of inappropriate secretion of antidiuretic hormone: Secondary | ICD-10-CM | POA: Diagnosis not present

## 2015-11-27 DIAGNOSIS — I10 Essential (primary) hypertension: Secondary | ICD-10-CM | POA: Diagnosis not present

## 2015-11-27 DIAGNOSIS — E871 Hypo-osmolality and hyponatremia: Secondary | ICD-10-CM | POA: Diagnosis not present

## 2015-11-27 DIAGNOSIS — Z853 Personal history of malignant neoplasm of breast: Secondary | ICD-10-CM | POA: Diagnosis not present

## 2015-11-27 DIAGNOSIS — E222 Syndrome of inappropriate secretion of antidiuretic hormone: Secondary | ICD-10-CM | POA: Diagnosis not present

## 2015-11-27 DIAGNOSIS — Z7982 Long term (current) use of aspirin: Secondary | ICD-10-CM | POA: Diagnosis not present

## 2015-11-27 DIAGNOSIS — Z4802 Encounter for removal of sutures: Secondary | ICD-10-CM | POA: Diagnosis not present

## 2015-11-27 DIAGNOSIS — Z7189 Other specified counseling: Secondary | ICD-10-CM | POA: Diagnosis not present

## 2015-11-27 DIAGNOSIS — S51002D Unspecified open wound of left elbow, subsequent encounter: Secondary | ICD-10-CM | POA: Diagnosis not present

## 2015-11-28 DIAGNOSIS — E222 Syndrome of inappropriate secretion of antidiuretic hormone: Secondary | ICD-10-CM | POA: Diagnosis not present

## 2015-11-28 DIAGNOSIS — Z853 Personal history of malignant neoplasm of breast: Secondary | ICD-10-CM | POA: Diagnosis not present

## 2015-11-28 DIAGNOSIS — Z7982 Long term (current) use of aspirin: Secondary | ICD-10-CM | POA: Diagnosis not present

## 2015-11-28 DIAGNOSIS — S51002D Unspecified open wound of left elbow, subsequent encounter: Secondary | ICD-10-CM | POA: Diagnosis not present

## 2015-11-28 DIAGNOSIS — I10 Essential (primary) hypertension: Secondary | ICD-10-CM | POA: Diagnosis not present

## 2015-11-29 DIAGNOSIS — S51002D Unspecified open wound of left elbow, subsequent encounter: Secondary | ICD-10-CM | POA: Diagnosis not present

## 2015-11-29 DIAGNOSIS — Z853 Personal history of malignant neoplasm of breast: Secondary | ICD-10-CM | POA: Diagnosis not present

## 2015-11-29 DIAGNOSIS — E222 Syndrome of inappropriate secretion of antidiuretic hormone: Secondary | ICD-10-CM | POA: Diagnosis not present

## 2015-11-29 DIAGNOSIS — I10 Essential (primary) hypertension: Secondary | ICD-10-CM | POA: Diagnosis not present

## 2015-11-29 DIAGNOSIS — Z7982 Long term (current) use of aspirin: Secondary | ICD-10-CM | POA: Diagnosis not present

## 2015-12-03 DIAGNOSIS — I10 Essential (primary) hypertension: Secondary | ICD-10-CM | POA: Diagnosis not present

## 2015-12-03 DIAGNOSIS — S51002D Unspecified open wound of left elbow, subsequent encounter: Secondary | ICD-10-CM | POA: Diagnosis not present

## 2015-12-03 DIAGNOSIS — E222 Syndrome of inappropriate secretion of antidiuretic hormone: Secondary | ICD-10-CM | POA: Diagnosis not present

## 2015-12-03 DIAGNOSIS — Z7982 Long term (current) use of aspirin: Secondary | ICD-10-CM | POA: Diagnosis not present

## 2015-12-03 DIAGNOSIS — Z853 Personal history of malignant neoplasm of breast: Secondary | ICD-10-CM | POA: Diagnosis not present

## 2015-12-04 DIAGNOSIS — Z7982 Long term (current) use of aspirin: Secondary | ICD-10-CM | POA: Diagnosis not present

## 2015-12-04 DIAGNOSIS — S51002D Unspecified open wound of left elbow, subsequent encounter: Secondary | ICD-10-CM | POA: Diagnosis not present

## 2015-12-04 DIAGNOSIS — E222 Syndrome of inappropriate secretion of antidiuretic hormone: Secondary | ICD-10-CM | POA: Diagnosis not present

## 2015-12-04 DIAGNOSIS — I10 Essential (primary) hypertension: Secondary | ICD-10-CM | POA: Diagnosis not present

## 2015-12-04 DIAGNOSIS — Z853 Personal history of malignant neoplasm of breast: Secondary | ICD-10-CM | POA: Diagnosis not present

## 2015-12-05 DIAGNOSIS — Z7982 Long term (current) use of aspirin: Secondary | ICD-10-CM | POA: Diagnosis not present

## 2015-12-05 DIAGNOSIS — E222 Syndrome of inappropriate secretion of antidiuretic hormone: Secondary | ICD-10-CM | POA: Diagnosis not present

## 2015-12-05 DIAGNOSIS — I10 Essential (primary) hypertension: Secondary | ICD-10-CM | POA: Diagnosis not present

## 2015-12-05 DIAGNOSIS — Z853 Personal history of malignant neoplasm of breast: Secondary | ICD-10-CM | POA: Diagnosis not present

## 2015-12-05 DIAGNOSIS — S51002D Unspecified open wound of left elbow, subsequent encounter: Secondary | ICD-10-CM | POA: Diagnosis not present

## 2015-12-09 DIAGNOSIS — E222 Syndrome of inappropriate secretion of antidiuretic hormone: Secondary | ICD-10-CM | POA: Diagnosis not present

## 2015-12-09 DIAGNOSIS — Z7982 Long term (current) use of aspirin: Secondary | ICD-10-CM | POA: Diagnosis not present

## 2015-12-09 DIAGNOSIS — Z853 Personal history of malignant neoplasm of breast: Secondary | ICD-10-CM | POA: Diagnosis not present

## 2015-12-09 DIAGNOSIS — I10 Essential (primary) hypertension: Secondary | ICD-10-CM | POA: Diagnosis not present

## 2015-12-09 DIAGNOSIS — S51002D Unspecified open wound of left elbow, subsequent encounter: Secondary | ICD-10-CM | POA: Diagnosis not present

## 2015-12-10 DIAGNOSIS — E222 Syndrome of inappropriate secretion of antidiuretic hormone: Secondary | ICD-10-CM | POA: Diagnosis not present

## 2015-12-10 DIAGNOSIS — Z7982 Long term (current) use of aspirin: Secondary | ICD-10-CM | POA: Diagnosis not present

## 2015-12-10 DIAGNOSIS — S51002D Unspecified open wound of left elbow, subsequent encounter: Secondary | ICD-10-CM | POA: Diagnosis not present

## 2015-12-10 DIAGNOSIS — I10 Essential (primary) hypertension: Secondary | ICD-10-CM | POA: Diagnosis not present

## 2015-12-10 DIAGNOSIS — Z853 Personal history of malignant neoplasm of breast: Secondary | ICD-10-CM | POA: Diagnosis not present

## 2015-12-11 DIAGNOSIS — E222 Syndrome of inappropriate secretion of antidiuretic hormone: Secondary | ICD-10-CM | POA: Diagnosis not present

## 2015-12-11 DIAGNOSIS — S51002D Unspecified open wound of left elbow, subsequent encounter: Secondary | ICD-10-CM | POA: Diagnosis not present

## 2015-12-11 DIAGNOSIS — Z7982 Long term (current) use of aspirin: Secondary | ICD-10-CM | POA: Diagnosis not present

## 2015-12-11 DIAGNOSIS — Z853 Personal history of malignant neoplasm of breast: Secondary | ICD-10-CM | POA: Diagnosis not present

## 2015-12-11 DIAGNOSIS — I10 Essential (primary) hypertension: Secondary | ICD-10-CM | POA: Diagnosis not present

## 2015-12-16 DIAGNOSIS — Z7982 Long term (current) use of aspirin: Secondary | ICD-10-CM | POA: Diagnosis not present

## 2015-12-16 DIAGNOSIS — S51002D Unspecified open wound of left elbow, subsequent encounter: Secondary | ICD-10-CM | POA: Diagnosis not present

## 2015-12-16 DIAGNOSIS — E222 Syndrome of inappropriate secretion of antidiuretic hormone: Secondary | ICD-10-CM | POA: Diagnosis not present

## 2015-12-16 DIAGNOSIS — Z853 Personal history of malignant neoplasm of breast: Secondary | ICD-10-CM | POA: Diagnosis not present

## 2015-12-16 DIAGNOSIS — I10 Essential (primary) hypertension: Secondary | ICD-10-CM | POA: Diagnosis not present

## 2015-12-17 DIAGNOSIS — I10 Essential (primary) hypertension: Secondary | ICD-10-CM | POA: Diagnosis not present

## 2015-12-17 DIAGNOSIS — Z853 Personal history of malignant neoplasm of breast: Secondary | ICD-10-CM | POA: Diagnosis not present

## 2015-12-17 DIAGNOSIS — S51002D Unspecified open wound of left elbow, subsequent encounter: Secondary | ICD-10-CM | POA: Diagnosis not present

## 2015-12-17 DIAGNOSIS — Z7982 Long term (current) use of aspirin: Secondary | ICD-10-CM | POA: Diagnosis not present

## 2015-12-17 DIAGNOSIS — E222 Syndrome of inappropriate secretion of antidiuretic hormone: Secondary | ICD-10-CM | POA: Diagnosis not present

## 2015-12-19 DIAGNOSIS — E222 Syndrome of inappropriate secretion of antidiuretic hormone: Secondary | ICD-10-CM | POA: Diagnosis not present

## 2015-12-19 DIAGNOSIS — Z853 Personal history of malignant neoplasm of breast: Secondary | ICD-10-CM | POA: Diagnosis not present

## 2015-12-19 DIAGNOSIS — I10 Essential (primary) hypertension: Secondary | ICD-10-CM | POA: Diagnosis not present

## 2015-12-19 DIAGNOSIS — S51002D Unspecified open wound of left elbow, subsequent encounter: Secondary | ICD-10-CM | POA: Diagnosis not present

## 2015-12-19 DIAGNOSIS — Z7982 Long term (current) use of aspirin: Secondary | ICD-10-CM | POA: Diagnosis not present

## 2015-12-24 DIAGNOSIS — Z853 Personal history of malignant neoplasm of breast: Secondary | ICD-10-CM | POA: Diagnosis not present

## 2015-12-24 DIAGNOSIS — Z7982 Long term (current) use of aspirin: Secondary | ICD-10-CM | POA: Diagnosis not present

## 2015-12-24 DIAGNOSIS — E222 Syndrome of inappropriate secretion of antidiuretic hormone: Secondary | ICD-10-CM | POA: Diagnosis not present

## 2015-12-24 DIAGNOSIS — S51002D Unspecified open wound of left elbow, subsequent encounter: Secondary | ICD-10-CM | POA: Diagnosis not present

## 2015-12-24 DIAGNOSIS — I10 Essential (primary) hypertension: Secondary | ICD-10-CM | POA: Diagnosis not present

## 2015-12-25 DIAGNOSIS — E222 Syndrome of inappropriate secretion of antidiuretic hormone: Secondary | ICD-10-CM | POA: Diagnosis not present

## 2015-12-25 DIAGNOSIS — R5381 Other malaise: Secondary | ICD-10-CM | POA: Diagnosis not present

## 2015-12-25 DIAGNOSIS — E871 Hypo-osmolality and hyponatremia: Secondary | ICD-10-CM | POA: Diagnosis not present

## 2015-12-25 DIAGNOSIS — R4189 Other symptoms and signs involving cognitive functions and awareness: Secondary | ICD-10-CM | POA: Diagnosis not present

## 2015-12-30 DIAGNOSIS — S51002D Unspecified open wound of left elbow, subsequent encounter: Secondary | ICD-10-CM | POA: Diagnosis not present

## 2015-12-30 DIAGNOSIS — Z7982 Long term (current) use of aspirin: Secondary | ICD-10-CM | POA: Diagnosis not present

## 2015-12-30 DIAGNOSIS — E222 Syndrome of inappropriate secretion of antidiuretic hormone: Secondary | ICD-10-CM | POA: Diagnosis not present

## 2015-12-30 DIAGNOSIS — Z853 Personal history of malignant neoplasm of breast: Secondary | ICD-10-CM | POA: Diagnosis not present

## 2015-12-30 DIAGNOSIS — I10 Essential (primary) hypertension: Secondary | ICD-10-CM | POA: Diagnosis not present

## 2015-12-31 DIAGNOSIS — S51002D Unspecified open wound of left elbow, subsequent encounter: Secondary | ICD-10-CM | POA: Diagnosis not present

## 2015-12-31 DIAGNOSIS — Z7982 Long term (current) use of aspirin: Secondary | ICD-10-CM | POA: Diagnosis not present

## 2015-12-31 DIAGNOSIS — Z853 Personal history of malignant neoplasm of breast: Secondary | ICD-10-CM | POA: Diagnosis not present

## 2015-12-31 DIAGNOSIS — I10 Essential (primary) hypertension: Secondary | ICD-10-CM | POA: Diagnosis not present

## 2015-12-31 DIAGNOSIS — E222 Syndrome of inappropriate secretion of antidiuretic hormone: Secondary | ICD-10-CM | POA: Diagnosis not present

## 2016-01-06 DIAGNOSIS — I10 Essential (primary) hypertension: Secondary | ICD-10-CM | POA: Diagnosis not present

## 2016-01-06 DIAGNOSIS — E222 Syndrome of inappropriate secretion of antidiuretic hormone: Secondary | ICD-10-CM | POA: Diagnosis not present

## 2016-01-06 DIAGNOSIS — S51002D Unspecified open wound of left elbow, subsequent encounter: Secondary | ICD-10-CM | POA: Diagnosis not present

## 2016-01-06 DIAGNOSIS — Z7982 Long term (current) use of aspirin: Secondary | ICD-10-CM | POA: Diagnosis not present

## 2016-01-06 DIAGNOSIS — Z853 Personal history of malignant neoplasm of breast: Secondary | ICD-10-CM | POA: Diagnosis not present

## 2016-01-14 DIAGNOSIS — Z853 Personal history of malignant neoplasm of breast: Secondary | ICD-10-CM | POA: Diagnosis not present

## 2016-01-14 DIAGNOSIS — S51002D Unspecified open wound of left elbow, subsequent encounter: Secondary | ICD-10-CM | POA: Diagnosis not present

## 2016-01-14 DIAGNOSIS — I10 Essential (primary) hypertension: Secondary | ICD-10-CM | POA: Diagnosis not present

## 2016-01-14 DIAGNOSIS — E222 Syndrome of inappropriate secretion of antidiuretic hormone: Secondary | ICD-10-CM | POA: Diagnosis not present

## 2016-01-14 DIAGNOSIS — Z7982 Long term (current) use of aspirin: Secondary | ICD-10-CM | POA: Diagnosis not present

## 2016-01-15 DIAGNOSIS — Z7982 Long term (current) use of aspirin: Secondary | ICD-10-CM | POA: Diagnosis not present

## 2016-01-15 DIAGNOSIS — I10 Essential (primary) hypertension: Secondary | ICD-10-CM | POA: Diagnosis not present

## 2016-01-15 DIAGNOSIS — Z853 Personal history of malignant neoplasm of breast: Secondary | ICD-10-CM | POA: Diagnosis not present

## 2016-01-15 DIAGNOSIS — E222 Syndrome of inappropriate secretion of antidiuretic hormone: Secondary | ICD-10-CM | POA: Diagnosis not present

## 2016-01-15 DIAGNOSIS — S51002D Unspecified open wound of left elbow, subsequent encounter: Secondary | ICD-10-CM | POA: Diagnosis not present

## 2016-02-05 ENCOUNTER — Ambulatory Visit: Payer: Medicare Other | Admitting: Neurology

## 2016-02-07 ENCOUNTER — Encounter: Payer: Self-pay | Admitting: Neurology

## 2016-04-14 DIAGNOSIS — Z23 Encounter for immunization: Secondary | ICD-10-CM | POA: Diagnosis not present

## 2016-05-01 DIAGNOSIS — R410 Disorientation, unspecified: Secondary | ICD-10-CM | POA: Diagnosis not present

## 2016-05-01 DIAGNOSIS — R8290 Unspecified abnormal findings in urine: Secondary | ICD-10-CM | POA: Diagnosis not present

## 2016-05-01 DIAGNOSIS — E222 Syndrome of inappropriate secretion of antidiuretic hormone: Secondary | ICD-10-CM | POA: Diagnosis not present

## 2016-05-08 ENCOUNTER — Encounter (HOSPITAL_BASED_OUTPATIENT_CLINIC_OR_DEPARTMENT_OTHER): Payer: Self-pay | Admitting: *Deleted

## 2016-05-08 ENCOUNTER — Emergency Department (HOSPITAL_BASED_OUTPATIENT_CLINIC_OR_DEPARTMENT_OTHER)
Admission: EM | Admit: 2016-05-08 | Discharge: 2016-05-08 | Disposition: A | Discharge status: Home / Self Care | Payer: Medicare Other | Attending: Emergency Medicine | Admitting: Emergency Medicine

## 2016-05-08 DIAGNOSIS — H73892 Other specified disorders of tympanic membrane, left ear: Secondary | ICD-10-CM | POA: Insufficient documentation

## 2016-05-08 DIAGNOSIS — Y939 Activity, unspecified: Secondary | ICD-10-CM | POA: Diagnosis not present

## 2016-05-08 DIAGNOSIS — X58XXXA Exposure to other specified factors, initial encounter: Secondary | ICD-10-CM | POA: Insufficient documentation

## 2016-05-08 DIAGNOSIS — Y999 Unspecified external cause status: Secondary | ICD-10-CM | POA: Diagnosis not present

## 2016-05-08 DIAGNOSIS — H6122 Impacted cerumen, left ear: Secondary | ICD-10-CM

## 2016-05-08 DIAGNOSIS — I10 Essential (primary) hypertension: Secondary | ICD-10-CM | POA: Insufficient documentation

## 2016-05-08 DIAGNOSIS — Z79899 Other long term (current) drug therapy: Secondary | ICD-10-CM | POA: Diagnosis not present

## 2016-05-08 DIAGNOSIS — Z7982 Long term (current) use of aspirin: Secondary | ICD-10-CM | POA: Insufficient documentation

## 2016-05-08 DIAGNOSIS — Y929 Unspecified place or not applicable: Secondary | ICD-10-CM | POA: Insufficient documentation

## 2016-05-08 DIAGNOSIS — T162XXA Foreign body in left ear, initial encounter: Secondary | ICD-10-CM | POA: Diagnosis present

## 2016-05-08 NOTE — ED Triage Notes (Signed)
Reports that her hearing aid battery is in her right ear.

## 2016-05-08 NOTE — ED Provider Notes (Signed)
[place] DEPT [facility] Provider Note   CSN: [id number] Arrival date & time: 05/08/16  2117  By signing my name below, I, Jeanell Sparrow, attest that this documentation has been prepared under the direction and in the presence of non-physician practitioner, Etta Quill, NP. Electronically Signed: Jeanell Sparrow, Scribe. 05/08/2016. 10:15 PM.  History   Chief Complaint Chief Complaint  Patient presents with  . Foreign Body in [place]    The history is provided by the patient. No language interpreter was used.   HPI Comments: Nichole [name] is a 82 y.o. female who presents to the Emergency Department complaining of a foreign body in her right ear. She states that when she took out her hearing aid, she noticed that her hearing aid battery was missing today. She suspects the battery got caught in her right ear. She denies any other complaints.   Past Medical History:  Diagnosis Date  . Breast CA [facility])     Patient Active Problem List   Diagnosis Date Noted  . SIADH (syndrome of inappropriate ADH production) ([place])   . HTN (hypertension) 11/16/2015  . Hyponatremia 11/13/2015    Past Surgical History:  Procedure Laterality Date  . BREAST SURGERY      OB History    No data available       Home Medications    Prior to Admission medications   Medication Sig Start Date End Date Taking? Authorizing Provider  aspirin 81 MG chewable tablet Chew by mouth daily.    Historical Provider, MD  co-enzyme Q-10 30 MG capsule Take 30 mg by mouth 3 (three) times daily.    Historical Provider, MD  Omega-3 Fatty Acids (FISH OIL) 1000 MG CAPS Take by mouth.    Historical Provider, MD  sodium chloride 1 g tablet Take 1 tablet (1 g total) by mouth 2 (two) times daily with a meal. 11/17/15   Barton Dubois, MD  traMADol (ULTRAM) 50 MG tablet Take 1 tablet (50 mg total) by mouth every 8 (eight) hours as needed for severe pain. 11/17/15   Barton Dubois, MD    Family History Family History  Problem  Relation Age of Onset  . Ovarian cancer Sister     Social History Social History  Substance Use Topics  . Smoking status: Never Smoker  . Smokeless tobacco: Not on file  . Alcohol use No     Allergies   Review of patient's allergies indicates no known allergies.   Review of Systems Review of Systems  All other systems reviewed and are negative.   Physical Exam Updated Vital Signs BP 153/63 (BP Location: Left Arm)   Pulse 84   Temp 97.8 F (36.6 C) (Oral)   Resp 20   Ht 5\' 5"  (1.651 m)   Wt 61.2 kg   SpO2 99%   BMI 22.47 kg/m   Physical Exam  Constitutional: She appears well-developed and well-nourished. No distress.  HENT:  Head: Normocephalic and atraumatic.  No foreign body visualized in right ear. Cerumen impaction found in the left ear.   Eyes: Conjunctivae are normal.  Neck: Neck supple.  Cardiovascular: Normal rate and regular rhythm.   No murmur heard. Pulmonary/Chest: Effort normal and breath sounds normal. No respiratory distress.  Abdominal: Soft. There is no tenderness. There is no guarding.  Musculoskeletal: She exhibits no edema.  Neurological: She is alert.  Skin: Skin is warm and dry.  Psychiatric: She has a normal mood and affect.  Nursing note and vitals reviewed.  ED Treatments / Results  Labs (all labs ordered are listed, but only abnormal results are displayed) Labs Reviewed - No data to display  EKG  EKG Interpretation None       Radiology No results found.  Procedures Procedures (including critical care time)  Medications Ordered in ED Medications - No data to display   Initial Impression / Assessment and Plan / ED Course  I have reviewed the triage vital signs and the nursing notes.  Pertinent labs & imaging results that were available during my care of the patient were reviewed by me and considered in my medical decision making (see chart for details).  Clinical Course  Pt presenting for evaluation of a  possible foreign body in her right ear. She states the battery was missing from the right ear hearing aid when she removed it, and thought she felt the battery in her ear when she reached in with a finger nail to attempt to retrieve it. No foreign body noted in right ear. TM clear and intact. Small cerumen impaction noted in left ear, which was removed via irrigation.  Care instructions provided. Follow with PCP as needed.  Final Clinical Impressions(s) / ED Diagnoses   Final diagnoses:  Cerumen debris on tympanic membrane of left ear    New Prescriptions New Prescriptions   No medications on file   I personally performed the services described in this documentation, which was scribed in my presence. The recorded information has been reviewed and is accurate.    Etta Quill, NP 05/09/16 [id number]    Veryl Speak, MD 05/10/16 2041

## 2017-06-18 DIAGNOSIS — E222 Syndrome of inappropriate secretion of antidiuretic hormone: Secondary | ICD-10-CM | POA: Diagnosis not present

## 2017-06-18 DIAGNOSIS — R4189 Other symptoms and signs involving cognitive functions and awareness: Secondary | ICD-10-CM | POA: Diagnosis not present

## 2017-06-18 DIAGNOSIS — R413 Other amnesia: Secondary | ICD-10-CM | POA: Diagnosis not present

## 2017-06-23 DIAGNOSIS — E538 Deficiency of other specified B group vitamins: Secondary | ICD-10-CM | POA: Diagnosis not present

## 2017-08-24 ENCOUNTER — Encounter: Payer: Self-pay | Admitting: *Deleted

## 2017-08-25 ENCOUNTER — Ambulatory Visit (INDEPENDENT_AMBULATORY_CARE_PROVIDER_SITE_OTHER): Payer: Medicare Other | Admitting: Neurology

## 2017-08-25 ENCOUNTER — Other Ambulatory Visit: Payer: Self-pay

## 2017-08-25 ENCOUNTER — Encounter: Payer: Self-pay | Admitting: Neurology

## 2017-08-25 ENCOUNTER — Encounter (INDEPENDENT_AMBULATORY_CARE_PROVIDER_SITE_OTHER): Payer: Self-pay

## 2017-08-25 ENCOUNTER — Telehealth: Payer: Self-pay | Admitting: Neurology

## 2017-08-25 DIAGNOSIS — F0280 Dementia in other diseases classified elsewhere without behavioral disturbance: Secondary | ICD-10-CM | POA: Insufficient documentation

## 2017-08-25 DIAGNOSIS — G301 Alzheimer's disease with late onset: Secondary | ICD-10-CM | POA: Diagnosis not present

## 2017-08-25 DIAGNOSIS — G309 Alzheimer's disease, unspecified: Secondary | ICD-10-CM

## 2017-08-25 HISTORY — DX: Dementia in other diseases classified elsewhere, unspecified severity, without behavioral disturbance, psychotic disturbance, mood disturbance, and anxiety: F02.80

## 2017-08-25 NOTE — Telephone Encounter (Signed)
This patient did not show for a new patient appointment today. 

## 2017-08-25 NOTE — Progress Notes (Signed)
Reason for visit: Alzheimer's disease  Referring physician: Dr. Quenten Raven is a 84 y.o. female  History of present illness:  Nichole [name] is a 84 year old right-handed white female with a history of a progressive memory disturbance over the last 2 years.  The patient currently lives at [facility] in an independent living situation.  She does not operate a motor vehicle, she gets at least 2 meals a day, she has actually gained weight since she has been at the facility.  The patient does socialize, she likes to read and do crafts.  The patient particularly over the last 6 months has had a significant decline in her cognitive capacity.  Her short-term memory is quite poor, she cannot remember anything.  She was given a prescription for Aricept in December 2018, she has not been able to keep up with this single medication.  The patient requires assistance with keeping up with appointments, her daughter now does the finances.  The patient reports no problems with balance, but the daughter indicates that she has fallen on occasion.  She sleeps fairly well at night, she does not have hallucinations, but she will misplace things about the house and then think that someone has stolen from her.  She denies headaches, dizziness, vision changes, or numbness or weakness of the face, arms, or legs.  The patient is sent to this office for an evaluation.  She has had recent blood work done, she has SIADH, she does not drink fluids excessively.  Blood work also included a vitamin B12 level, the family indicates that the studies were within normal limits.  Past Medical History:  Diagnosis Date  . Alzheimer disease 08/25/2017  . Breast CA ([place])   . TIA (transient ischemic attack) 2009    Past Surgical History:  Procedure Laterality Date  . BREAST SURGERY    . MASTECTOMY Left 1995    Family History  Problem Relation Age of Onset  . Ovarian cancer Sister   . Breast cancer Sister   .  Colon cancer Mother   . AAA (abdominal aortic aneurysm) Mother   . Coronary artery disease Father     Social history:  reports that  has never smoked. she has never used smokeless tobacco. She reports that she does not drink alcohol or use drugs.  Medications:  Prior to Admission medications   Medication Sig Start Date End Date Taking? Authorizing Provider  aspirin 81 MG chewable tablet Chew 81 mg by mouth daily.     [provider]  co-enzyme Q-10 30 MG capsule Take 30 mg by mouth 3 (three) times daily.    [provider]  Multiple Vitamins-Minerals (CENTRUM SILVER PO) Take 1 tablet by mouth daily.    [provider]  Omega-3 Fatty Acids (FISH OIL) 1000 MG CAPS Take 1 capsule by mouth daily.     [provider]  sodium chloride 1 g tablet Take 1 tablet (1 g total) by mouth 2 (two) times daily with a meal. Patient not taking: Reported on 08/25/2017 11/17/15   Barton Dubois, MD  traMADol (ULTRAM) 50 MG tablet Take 1 tablet (50 mg total) by mouth every 8 (eight) hours as needed for severe pain. Patient not taking: Reported on 08/25/2017 11/17/15   Barton Dubois, MD     No Known Allergies  ROS:  Out of a complete 14 system review of symptoms, the patient complains only of the following symptoms, and all other reviewed systems are negative.  Memory disturbance, confusion  Blood pressure (!) 151/76, pulse 86, height 5\' 5"  (1.651 m), weight 136 lb (61.7 kg).  Physical Exam  General: The patient is alert and cooperative at the time of the examination.  Eyes: Pupils are equal, round, and reactive to light. Discs are flat bilaterally.  Neck: The neck is supple, no carotid bruits are noted.  Respiratory: The respiratory examination is clear.  Cardiovascular: The cardiovascular examination reveals a regular rate and rhythm, no obvious murmurs or rubs are noted.  Skin: Extremities are without significant edema.  Neurologic Exam  Mental status: The  patient is alert and oriented x 2 at the time of the examination (not oriented to date). The Mini-Mental status examination done today shows a total score of 17/30.  Cranial nerves: Facial symmetry is present. There is good sensation of the face to pinprick and soft touch bilaterally. The strength of the facial muscles and the muscles to head turning and shoulder shrug are normal bilaterally. Speech is well enunciated, no aphasia or dysarthria is noted. Extraocular movements are full. Visual fields are full. The tongue is midline, and the patient has symmetric elevation of the soft palate. No obvious hearing deficits are noted.  Motor: The motor testing reveals 5 over 5 strength of all 4 extremities. Good symmetric motor tone is noted throughout.  Sensory: Sensory testing is intact to pinprick, soft touch, vibration sensation, and position sense on all 4 extremities. No evidence of extinction is noted.  Coordination: Cerebellar testing reveals good finger-nose-finger and heel-to-shin bilaterally.  Gait and station: Gait is normal for age, the patient has a kyphotic posture. Tandem gait is was not tested. Romberg is negative. No drift is seen.  Reflexes: Deep tendon reflexes are symmetric and normal bilaterally. Toes are downgoing bilaterally.   Assessment/Plan:  1.  Progressive memory disturbance, Alzheimer's disease  The patient is having ongoing progression of memory over the last 2 years, particularly in the last 6 months.  The patient is not taking her medications, she does not recall that she has any medications to take. I have indicated that the patient will likely require increased supervision over time, she may need to make a transition to assisted living within the next several months.  I have recommended a pill dispenser, the family needs to check on the patient frequently.  The patient will follow-up in 6 months.  Jill Alexanders MD 08/25/2017 9:32 AM  [facility] [address] [place], [place] [address]  Phone [phone] Fax [phone]

## 2017-08-25 NOTE — Patient Instructions (Signed)
   Restart Aricept 5 mg daily.  Begin Aricept (donepezil) at 5 mg at night for one month. If this medication is well-tolerated, please call our office and we will call in a prescription for the 10 mg tablets. Look out for side effects that may include nausea, diarrhea, weight loss, or stomach cramps. This medication will also cause a runny nose, therefore there is no need for allergy medications for this purpose.

## 2018-03-01 ENCOUNTER — Ambulatory Visit: Payer: Self-pay | Admitting: Adult Health

## 2018-05-02 ENCOUNTER — Telehealth: Payer: Self-pay | Admitting: *Deleted

## 2018-05-02 NOTE — Telephone Encounter (Signed)
LMVM for daughter, elaine that needing to reschedule appt for her mother to another time that day11-5-19 at 59, if that works or another day and time.  Please call me back to let me know, and sorry for the inconvenience.

## 2018-05-04 ENCOUNTER — Encounter: Payer: Self-pay | Admitting: Adult Health

## 2018-05-05 ENCOUNTER — Telehealth: Payer: Self-pay | Admitting: Neurology

## 2018-05-05 NOTE — Telephone Encounter (Signed)
Spoke with Margaretha Sheffield and gave appt. 05/09/18 at 1530, arrival time of 1500/fim

## 2018-05-05 NOTE — Telephone Encounter (Signed)
Pt daughter(on DPR-[place]ley,Elaine) has called re: pt last being seen in Feb of 2019.  Pt was r/s to be seen in late Aug and was r/s for 11-05.  Daughter upset that pt would now have to wait until May 2020 to see Dr due to NP Jinny Blossom being out.  Pt daughter is asking pt be put on wait list and that message be sent to RN to see if there is a way pt can be seen before 11-2018.  Pt daughter is asking for a call on her work phone [phone] with [phone]

## 2018-05-05 NOTE — Telephone Encounter (Signed)
[place] (identified work vm, as requested)/fim

## 2018-05-06 DIAGNOSIS — IMO0001 Reserved for inherently not codable concepts without codable children: Secondary | ICD-10-CM | POA: Diagnosis present

## 2018-05-06 DIAGNOSIS — N183 Chronic kidney disease, stage 3 unspecified: Secondary | ICD-10-CM | POA: Diagnosis present

## 2018-05-06 DIAGNOSIS — R911 Solitary pulmonary nodule: Secondary | ICD-10-CM | POA: Diagnosis present

## 2018-05-06 DIAGNOSIS — I4819 Other persistent atrial fibrillation: Secondary | ICD-10-CM | POA: Diagnosis present

## 2018-05-06 DIAGNOSIS — I5033 Acute on chronic diastolic (congestive) heart failure: Secondary | ICD-10-CM | POA: Diagnosis present

## 2018-05-06 HISTORY — DX: Other persistent atrial fibrillation: I48.19

## 2018-05-09 ENCOUNTER — Emergency Department (HOSPITAL_COMMUNITY): Payer: Medicare Other

## 2018-05-09 ENCOUNTER — Encounter (HOSPITAL_COMMUNITY): Payer: Self-pay | Admitting: Emergency Medicine

## 2018-05-09 ENCOUNTER — Inpatient Hospital Stay (HOSPITAL_COMMUNITY): Payer: Medicare Other

## 2018-05-09 ENCOUNTER — Inpatient Hospital Stay (HOSPITAL_COMMUNITY)
Admission: EM | Admit: 2018-05-09 | Discharge: 2018-05-12 | DRG: 470 | Disposition: A | Discharge status: Skilled Nursing Facility | Payer: Medicare Other | Attending: Internal Medicine | Admitting: Internal Medicine

## 2018-05-09 ENCOUNTER — Ambulatory Visit: Payer: Medicare Other | Admitting: Neurology

## 2018-05-09 ENCOUNTER — Other Ambulatory Visit: Payer: Self-pay

## 2018-05-09 ENCOUNTER — Telehealth: Payer: Self-pay | Admitting: *Deleted

## 2018-05-09 DIAGNOSIS — M6281 Muscle weakness (generalized): Secondary | ICD-10-CM | POA: Diagnosis not present

## 2018-05-09 DIAGNOSIS — Z9012 Acquired absence of left breast and nipple: Secondary | ICD-10-CM | POA: Diagnosis not present

## 2018-05-09 DIAGNOSIS — R279 Unspecified lack of coordination: Secondary | ICD-10-CM | POA: Diagnosis not present

## 2018-05-09 DIAGNOSIS — Z79899 Other long term (current) drug therapy: Secondary | ICD-10-CM

## 2018-05-09 DIAGNOSIS — E538 Deficiency of other specified B group vitamins: Secondary | ICD-10-CM | POA: Diagnosis present

## 2018-05-09 DIAGNOSIS — R45851 Suicidal ideations: Secondary | ICD-10-CM | POA: Diagnosis present

## 2018-05-09 DIAGNOSIS — Z803 Family history of malignant neoplasm of breast: Secondary | ICD-10-CM

## 2018-05-09 DIAGNOSIS — W1830XA Fall on same level, unspecified, initial encounter: Secondary | ICD-10-CM | POA: Diagnosis present

## 2018-05-09 DIAGNOSIS — Z8673 Personal history of transient ischemic attack (TIA), and cerebral infarction without residual deficits: Secondary | ICD-10-CM

## 2018-05-09 DIAGNOSIS — F0280 Dementia in other diseases classified elsewhere without behavioral disturbance: Secondary | ICD-10-CM | POA: Diagnosis present

## 2018-05-09 DIAGNOSIS — R41841 Cognitive communication deficit: Secondary | ICD-10-CM | POA: Diagnosis not present

## 2018-05-09 DIAGNOSIS — D500 Iron deficiency anemia secondary to blood loss (chronic): Secondary | ICD-10-CM | POA: Diagnosis not present

## 2018-05-09 DIAGNOSIS — F4325 Adjustment disorder with mixed disturbance of emotions and conduct: Secondary | ICD-10-CM | POA: Diagnosis present

## 2018-05-09 DIAGNOSIS — Y92009 Unspecified place in unspecified non-institutional (private) residence as the place of occurrence of the external cause: Secondary | ICD-10-CM

## 2018-05-09 DIAGNOSIS — N179 Acute kidney failure, unspecified: Secondary | ICD-10-CM | POA: Diagnosis present

## 2018-05-09 DIAGNOSIS — Z7982 Long term (current) use of aspirin: Secondary | ICD-10-CM | POA: Diagnosis not present

## 2018-05-09 DIAGNOSIS — E871 Hypo-osmolality and hyponatremia: Secondary | ICD-10-CM | POA: Diagnosis present

## 2018-05-09 DIAGNOSIS — I959 Hypotension, unspecified: Secondary | ICD-10-CM | POA: Diagnosis not present

## 2018-05-09 DIAGNOSIS — W19XXXA Unspecified fall, initial encounter: Secondary | ICD-10-CM

## 2018-05-09 DIAGNOSIS — F329 Major depressive disorder, single episode, unspecified: Secondary | ICD-10-CM | POA: Diagnosis present

## 2018-05-09 DIAGNOSIS — Z66 Do not resuscitate: Secondary | ICD-10-CM | POA: Diagnosis present

## 2018-05-09 DIAGNOSIS — S72001A Fracture of unspecified part of neck of right femur, initial encounter for closed fracture: Secondary | ICD-10-CM

## 2018-05-09 DIAGNOSIS — Z853 Personal history of malignant neoplasm of breast: Secondary | ICD-10-CM | POA: Diagnosis not present

## 2018-05-09 DIAGNOSIS — Z743 Need for continuous supervision: Secondary | ICD-10-CM | POA: Diagnosis not present

## 2018-05-09 DIAGNOSIS — S72011A Unspecified intracapsular fracture of right femur, initial encounter for closed fracture: Principal | ICD-10-CM | POA: Diagnosis present

## 2018-05-09 DIAGNOSIS — G309 Alzheimer's disease, unspecified: Secondary | ICD-10-CM | POA: Diagnosis present

## 2018-05-09 DIAGNOSIS — S72001D Fracture of unspecified part of neck of right femur, subsequent encounter for closed fracture with routine healing: Secondary | ICD-10-CM | POA: Diagnosis not present

## 2018-05-09 DIAGNOSIS — D62 Acute posthemorrhagic anemia: Secondary | ICD-10-CM | POA: Diagnosis not present

## 2018-05-09 DIAGNOSIS — R918 Other nonspecific abnormal finding of lung field: Secondary | ICD-10-CM | POA: Diagnosis not present

## 2018-05-09 DIAGNOSIS — S72041A Displaced fracture of base of neck of right femur, initial encounter for closed fracture: Secondary | ICD-10-CM | POA: Diagnosis not present

## 2018-05-09 DIAGNOSIS — S72009A Fracture of unspecified part of neck of unspecified femur, initial encounter for closed fracture: Secondary | ICD-10-CM | POA: Diagnosis present

## 2018-05-09 DIAGNOSIS — Z96641 Presence of right artificial hip joint: Secondary | ICD-10-CM | POA: Diagnosis not present

## 2018-05-09 DIAGNOSIS — R2689 Other abnormalities of gait and mobility: Secondary | ICD-10-CM | POA: Diagnosis not present

## 2018-05-09 DIAGNOSIS — S8991XA Unspecified injury of right lower leg, initial encounter: Secondary | ICD-10-CM | POA: Diagnosis not present

## 2018-05-09 DIAGNOSIS — R278 Other lack of coordination: Secondary | ICD-10-CM | POA: Diagnosis not present

## 2018-05-09 DIAGNOSIS — Z419 Encounter for procedure for purposes other than remedying health state, unspecified: Secondary | ICD-10-CM

## 2018-05-09 DIAGNOSIS — Z471 Aftercare following joint replacement surgery: Secondary | ICD-10-CM | POA: Diagnosis not present

## 2018-05-09 DIAGNOSIS — I10 Essential (primary) hypertension: Secondary | ICD-10-CM | POA: Diagnosis not present

## 2018-05-09 LAB — CBC WITH DIFFERENTIAL/PLATELET
Abs Immature Granulocytes: 0.04 10*3/uL (ref 0.00–0.07)
BASOS ABS: 0 10*3/uL (ref 0.0–0.1)
Basophils Relative: 0 %
Eosinophils Absolute: 0 10*3/uL (ref 0.0–0.5)
Eosinophils Relative: 0 %
HCT: 39.6 % (ref 36.0–46.0)
HEMOGLOBIN: 12.5 g/dL (ref 12.0–15.0)
Immature Granulocytes: 0 %
Lymphocytes Relative: 8 %
Lymphs Abs: 0.7 10*3/uL (ref 0.7–4.0)
MCH: 31.3 pg (ref 26.0–34.0)
MCHC: 31.6 g/dL (ref 30.0–36.0)
MCV: 99.2 fL (ref 80.0–100.0)
MONO ABS: 0.5 10*3/uL (ref 0.1–1.0)
Monocytes Relative: 5 %
Neutro Abs: 8 10*3/uL — ABNORMAL HIGH (ref 1.7–7.7)
Neutrophils Relative %: 87 %
Platelets: 223 10*3/uL (ref 150–400)
RBC: 3.99 MIL/uL (ref 3.87–5.11)
RDW: 13.3 % (ref 11.5–15.5)
WBC: 9.3 10*3/uL (ref 4.0–10.5)
nRBC: 0 % (ref 0.0–0.2)

## 2018-05-09 LAB — I-STAT CHEM 8, ED
BUN: 26 mg/dL — ABNORMAL HIGH (ref 8–23)
CHLORIDE: 101 mmol/L (ref 98–111)
CREATININE: 1.1 mg/dL — AB (ref 0.44–1.00)
Calcium, Ion: 1.11 mmol/L — ABNORMAL LOW (ref 1.15–1.40)
GLUCOSE: 124 mg/dL — AB (ref 70–99)
HEMATOCRIT: 38 % (ref 36.0–46.0)
Hemoglobin: 12.9 g/dL (ref 12.0–15.0)
POTASSIUM: 4.8 mmol/L (ref 3.5–5.1)
Sodium: 136 mmol/L (ref 135–145)
TCO2: 31 mmol/L (ref 22–32)

## 2018-05-09 LAB — TYPE AND SCREEN
ABO/RH(D): O POS
Antibody Screen: NEGATIVE

## 2018-05-09 LAB — COMPREHENSIVE METABOLIC PANEL
ALBUMIN: 4.2 g/dL (ref 3.5–5.0)
ALK PHOS: 77 U/L (ref 38–126)
ALT: 26 U/L (ref 0–44)
AST: 39 U/L (ref 15–41)
Anion gap: 9 (ref 5–15)
BUN: 21 mg/dL (ref 8–23)
CALCIUM: 9.6 mg/dL (ref 8.9–10.3)
CO2: 28 mmol/L (ref 22–32)
CREATININE: 1.11 mg/dL — AB (ref 0.44–1.00)
Chloride: 100 mmol/L (ref 98–111)
GFR calc Af Amer: 48 mL/min — ABNORMAL LOW (ref >60)
GFR calc non Af Amer: 41 mL/min — ABNORMAL LOW (ref >60)
GLUCOSE: 126 mg/dL — AB (ref 70–99)
Potassium: 4.4 mmol/L (ref 3.5–5.1)
SODIUM: 137 mmol/L (ref 135–145)
Total Bilirubin: 0.8 mg/dL (ref 0.3–1.2)
Total Protein: 8 g/dL (ref 6.5–8.1)

## 2018-05-09 LAB — ABO/RH: ABO/RH(D): O POS

## 2018-05-09 MED ORDER — CEFAZOLIN SODIUM-DEXTROSE 2-4 GM/100ML-% IV SOLN
2.0000 g | INTRAVENOUS | Status: AC
  Administered 2018-05-10: 2 g via INTRAVENOUS
  Filled 2018-05-09: qty 100

## 2018-05-09 MED ORDER — POVIDONE-IODINE 10 % EX SWAB: 2.0000 "application " | Freq: Once | CUTANEOUS | Status: DC

## 2018-05-09 MED ORDER — ENOXAPARIN SODIUM 40 MG/0.4ML ~~LOC~~ SOLN: 40.0000 mg | SUBCUTANEOUS | Status: DC

## 2018-05-09 MED ORDER — MORPHINE SULFATE (PF) 2 MG/ML IV SOLN: 0.5000 mg | INTRAVENOUS | Status: DC | PRN

## 2018-05-09 MED ORDER — HYDROCODONE-ACETAMINOPHEN 5-325 MG PO TABS
1.0000 | ORAL_TABLET | Freq: Four times a day (QID) | ORAL | Status: DC | PRN
  Administered 2018-05-09 – 2018-05-11 (×2): 2 via ORAL
  Administered 2018-05-11 – 2018-05-12 (×2): 1 via ORAL
  Filled 2018-05-09: qty 1
  Filled 2018-05-09 (×2): qty 2
  Filled 2018-05-09: qty 1

## 2018-05-09 MED ORDER — SODIUM CHLORIDE 0.9 % IV SOLN
INTRAVENOUS | Status: DC
  Administered 2018-05-10 – 2018-05-11 (×3): via INTRAVENOUS

## 2018-05-09 MED ORDER — CHLORHEXIDINE GLUCONATE 4 % EX LIQD: 60.0000 mL | Freq: Once | CUTANEOUS | Status: DC

## 2018-05-09 MED ORDER — DONEPEZIL HCL 10 MG PO TABS
5.0000 mg | ORAL_TABLET | Freq: Every day | ORAL | Status: DC
  Administered 2018-05-09 – 2018-05-11 (×3): 5 mg via ORAL
  Filled 2018-05-09 (×3): qty 1

## 2018-05-09 NOTE — Consult Note (Signed)
Reason for Consult: Right hip pain Referring Physician: Dr. Marita [place] Nichole [name] is an 84 y.o. female.  HPI: The patient is a 84 year old female with past medical history significant for dementia.  She went with her daughter to a doctor's appointment today and fell injuring her right hip.  She complains of aching pain in the hip that has gotten worse as she has been lying on the stretcher.  It hurts with sharp pain with any motion.  She lives independently at [facility].  She is not diabetic.  She is not a smoker.  No history of injury or surgery to her right hip.  She is accompanied by her daughter.  History is provided by the patient's family and review of the records.  Past Medical History:  Diagnosis Date  . Alzheimer disease ([place]) 08/25/2017  . Breast CA ([place])   . TIA (transient ischemic attack) 2009    Past Surgical History:  Procedure Laterality Date  . BREAST SURGERY    . MASTECTOMY Left 1995    Family History  Problem Relation Age of Onset  . Ovarian cancer Sister   . Breast cancer Sister   . Colon cancer Mother   . AAA (abdominal aortic aneurysm) Mother   . Coronary artery disease Father     Social History:  reports that she has never smoked. She has never used smokeless tobacco. She reports that she does not drink alcohol or use drugs.  Allergies: No Known Allergies  Medications: I have reviewed the patient's current medications.  Results for orders placed or performed during the hospital encounter of 05/09/18 (from the past 48 hour(s))  CBC with Differential/Platelet     Status: Abnormal   Collection Time: 05/09/18  5:43 PM  Result Value Ref Range   WBC 9.3 4.0 - 10.5 K/uL   RBC 3.99 3.87 - 5.11 MIL/uL   Hemoglobin 12.5 12.0 - 15.0 g/dL   HCT 39.6 36.0 - 46.0 %   MCV 99.2 80.0 - 100.0 fL   MCH 31.3 26.0 - 34.0 pg   MCHC 31.6 30.0 - 36.0 g/dL   RDW 13.3 11.5 - 15.5 %   Platelets 223 150 - 400 K/uL   nRBC 0.0 0.0 - 0.2 %   Neutrophils Relative % 87 %    Neutro Abs 8.0 (H) 1.7 - 7.7 K/uL   Lymphocytes Relative 8 %   Lymphs Abs 0.7 0.7 - 4.0 K/uL   Monocytes Relative 5 %   Monocytes Absolute 0.5 0.1 - 1.0 K/uL   Eosinophils Relative 0 %   Eosinophils Absolute 0.0 0.0 - 0.5 K/uL   Basophils Relative 0 %   Basophils Absolute 0.0 0.0 - 0.1 K/uL   Immature Granulocytes 0 %   Abs Immature Granulocytes 0.04 0.00 - 0.07 K/uL    Comment: Performed at [facility], [place] [address], [place], [place] [address]  Comprehensive metabolic panel     Status: Abnormal   Collection Time: 05/09/18  5:43 PM  Result Value Ref Range   Sodium 137 135 - 145 mmol/L   Potassium 4.4 3.5 - 5.1 mmol/L   Chloride 100 98 - 111 mmol/L   CO2 28 22 - 32 mmol/L   Glucose, Bld 126 (H) 70 - 99 mg/dL   BUN 21 8 - 23 mg/dL   Creatinine, Ser 1.11 (H) 0.44 - 1.00 mg/dL   Calcium 9.6 8.9 - 10.3 mg/dL   Total Protein 8.0 6.5 - 8.1 g/dL   Albumin 4.2 3.5 -  5.0 g/dL   AST 39 15 - 41 U/L   ALT 26 0 - 44 U/L   Alkaline Phosphatase 77 38 - 126 U/L   Total Bilirubin 0.8 0.3 - 1.2 mg/dL   GFR calc non Af Amer 41 (L) >60 mL/min   GFR calc Af Amer 48 (L) >60 mL/min    Comment: (NOTE) The eGFR has been calculated using the CKD EPI equation. This calculation has not been validated in all clinical situations. eGFR's persistently <60 mL/min signify possible Chronic Kidney Disease.    Anion gap 9 5 - 15    Comment: Performed at [facility], [place] [address] [place], [place] [address]  Type and screen     Status: None (Preliminary result)   Collection Time: 05/09/18  5:43 PM  Result Value Ref Range   ABO/RH(D) PENDING    Antibody Screen PENDING    Sample Expiration      05/12/2018 Performed at [facility] [place] [address], [place], [place] [address]   I-stat chem 8, ed     Status: Abnormal   Collection Time: 05/09/18  5:54 PM  Result Value Ref Range   Sodium 136 135 - 145 mmol/L   Potassium 4.8 3.5 - 5.1 mmol/L    Chloride 101 98 - 111 mmol/L   BUN 26 (H) 8 - 23 mg/dL   Creatinine, Ser 1.10 (H) 0.44 - 1.00 mg/dL   Glucose, Bld 124 (H) 70 - 99 mg/dL   Calcium, Ion 1.11 (L) 1.15 - 1.40 mmol/L   TCO2 31 22 - 32 mmol/L   Hemoglobin 12.9 12.0 - 15.0 g/dL   HCT 38.0 36.0 - 46.0 %    Dg Knee Complete 4 Views Right  Result Date: 05/09/2018 CLINICAL DATA:  Initial evaluation for acute trauma, fall. EXAM: RIGHT KNEE - COMPLETE 4+ VIEW COMPARISON:  None. FINDINGS: No acute fracture or dislocation. No joint effusion. Moderate tricompartmental degenerative osteoarthrosis. Associated chondrocalcinosis. No acute soft tissue abnormality. Bones mildly osteopenic. IMPRESSION: 1. No acute osseous abnormality about the knee. 2. Moderate tricompartmental degenerative osteoarthrosis. Electronically Signed   By: Jeannine Boga M.D.   On: 05/09/2018 16:46   Dg Hip Unilat  With Pelvis 2-3 Views Right  Result Date: 05/09/2018 CLINICAL DATA:  Fall.  Right hip and knee pain. EXAM: DG HIP (WITH OR WITHOUT PELVIS) 2-3V RIGHT COMPARISON:  Right femur 05/09/2018 FINDINGS: Subcapital fracture involving the proximal right femur. Fracture is displaced with superior displacement of the right femoral trochanters. There may be a small bone fragment near the superolateral aspect of the right femoral head. Right femoral head appears to be located. Pelvic bony ring is intact. Sclerosis at the symphysis pubis. Joint space narrowing involving the superior left hip joint. Curvature and degenerative changes in the lower lumbar spine. IMPRESSION: Right femoral subcapital fracture. Electronically Signed   By: Markus Daft M.D.   On: 05/09/2018 16:47   Dg Femur 1v Right  Result Date: 05/09/2018 CLINICAL DATA:  Initial evaluation for acute trauma, fall. EXAM: RIGHT FEMUR 1 VIEW COMPARISON:  None. FINDINGS: Acute fracture extending through the subcapital right femoral neck partially visualized. Associated mild superior subluxation. Finding better  evaluated on concomitant radiograph of the right hip. Subtrochanteric femur otherwise intact. No other acute osseous abnormality. Moderate to advanced degenerative osteoarthrosis noted about the knee. IMPRESSION: 1. Acute fracture involving the subcapital right femoral neck with mild superior subluxation, better evaluated on concomitant radiograph of the right hip. 2. No other acute osseous  abnormality about the femur. Electronically Signed   By: Jeannine Boga M.D.   On: 07-Jun-2018 16:44    ROS: No recent fever, chills, nausea, vomiting or changes in her appetite PE:  Blood pressure (!) 178/62, pulse 87, temperature 97.8 F (36.6 C), temperature source Oral, resp. rate 14, height _0  (1.651 m), weight 61.2 kg, SpO2 97 %. Well-nourished well-developed elderly woman in no apparent distress.  Alert and oriented x4.  Mood and affect are normal.  Extraocular motions are intact.  Respirations are unlabored.  Right lower extremity is shortened and slightly externally rotated.  2+ dorsalis pedis pulse.  Intact sensibility to light touch dorsally and plantarly at the forefoot.  5 out of 5 strength in plantar flexion and dorsiflexion of the toes.  No lymphadenopathy.  Pain with logroll at the right hip.  Assessment/Plan: Right femoral neck displaced fracture -to the operating room tomorrow for right hip hemiarthroplasty with Dr. Lyla Glassing.  N.p.o. after midnight.  Hold blood thinners.  I explained the nature of the injury to the patient and her family in detail.  We discussed the risks and benefits of the alternative treatment options.  She and her family elect operative treatment for this displaced and unstable hip fracture.  They understand the risks and benefits of the alternative treatment options including specifically risks of bleeding, infection, nerve damage, blood clots, continued pain and death.  Wylene Simmer 2018-06-07, 6:59 PM

## 2018-05-09 NOTE — H&P (Signed)
TRH H&P    Patient Demographics:    Nichole [name], is a 84 y.o. female  MRN: [id number]  DOB - 1923-05-22  Admit Date - 05/09/2018  Referring MD/NP/PA: Dr. Roderic Palau  Outpatient Primary MD for the patient is Rankins, Bill Salinas, MD  Patient coming from: Skilled nursing facility  Chief complaint-fall   HPI:    Nichole [name]  is a 84 y.o. female, with history of memory problems and no other significant medical problems came to hospital after patient fell while walking towards her car with her daughter.  Patient was unable to get up.  There was no loss of consciousness.  She did not hit her head. In the ED x-ray of the hip showed right femoral subcapital fracture. She denies any chest pain or shortness of breath. No nausea vomiting or diarrhea. No history of CAD, no history of CVA, no history of seizure, had TIAs in the past. Denies drinking alcohol, no tobacco abuse.    Review of systems:    In addition to the HPI above,    All other systems reviewed and are negative.   With Past History of the following :    Past Medical History:  Diagnosis Date  . Alzheimer disease ([place]) 08/25/2017  . Breast CA ([place])   . TIA (transient ischemic attack) 2009      Past Surgical History:  Procedure Laterality Date  . BREAST SURGERY    . MASTECTOMY Left 1995      Social History:      Social History   Tobacco Use  . Smoking status: Never Smoker  . Smokeless tobacco: Never Used  Substance Use Topics  . Alcohol use: No       Family History :     Family History  Problem Relation Age of Onset  . Ovarian cancer Sister   . Breast cancer Sister   . Colon cancer Mother   . AAA (abdominal aortic aneurysm) Mother   . Coronary artery disease Father       Home Medications:   Prior to Admission medications   Medication Sig Start Date End Date Taking? Authorizing Provider  donepezil (ARICEPT)  5 MG tablet Take 5 mg by mouth at bedtime.  04/23/18  Yes [provider]  sodium chloride 1 g tablet Take 1 tablet (1 g total) by mouth 2 (two) times daily with a meal. Patient not taking: Reported on 08/25/2017 11/17/15   Barton Dubois, MD  traMADol (ULTRAM) 50 MG tablet Take 1 tablet (50 mg total) by mouth every 8 (eight) hours as needed for severe pain. Patient not taking: Reported on 08/25/2017 11/17/15   Barton Dubois, MD     Allergies:    No Known Allergies   Physical Exam:   Vitals  Blood pressure (!) 178/62, pulse 87, temperature 97.8 F (36.6 C), temperature source Oral, resp. rate 14, height 5\' 5"  (1.651 m), weight 61.2 kg, SpO2 97 %.  1.  General: Appears in no acute distress  2. Psychiatric:  Intact judgement and  insight, awake alert, oriented  x 3.  3. Neurologic: No focal neurological deficits, all cranial nerves intact.Strength 5/5 all 4 extremities, sensation intact all 4 extremities, plantars down going.  4. Eyes :  anicteric sclerae, moist conjunctivae with no lid lag. PERRLA.  5. ENMT:  Oropharynx clear with moist mucous membranes and good dentition  6. Neck:  supple, no cervical lymphadenopathy appriciated, No thyromegaly  7. Respiratory : Normal respiratory effort, good air movement bilaterally,clear to  auscultation bilaterally  8. Cardiovascular : RRR, no gallops, rubs or murmurs, no leg edema  9. Gastrointestinal:  Positive bowel sounds, abdomen soft, non-tender to palpation,no hepatosplenomegaly, no rigidity or guarding       10. Skin:  No cyanosis, normal texture and turgor, no rash, lesions or ulcers  11.Musculoskeletal:  Tenderness at right hip    Data Review:    CBC Recent Labs  Lab 05/09/18 1743 05/09/18 1754  WBC 9.3  --   HGB 12.5 12.9  HCT 39.6 38.0  PLT 223  --   MCV 99.2  --   MCH 31.3  --   MCHC 31.6  --   RDW 13.3  --   LYMPHSABS 0.7  --   MONOABS 0.5  --   EOSABS 0.0  --   BASOSABS 0.0  --     ------------------------------------------------------------------------------------------------------------------  Results for orders placed or performed during the hospital encounter of 05/09/18 (from the past 48 hour(s))  CBC with Differential/Platelet     Status: Abnormal   Collection Time: 05/09/18  5:43 PM  Result Value Ref Range   WBC 9.3 4.0 - 10.5 K/uL   RBC 3.99 3.87 - 5.11 MIL/uL   Hemoglobin 12.5 12.0 - 15.0 g/dL   HCT 39.6 36.0 - 46.0 %   MCV 99.2 80.0 - 100.0 fL   MCH 31.3 26.0 - 34.0 pg   MCHC 31.6 30.0 - 36.0 g/dL   RDW 13.3 11.5 - 15.5 %   Platelets 223 150 - 400 K/uL   nRBC 0.0 0.0 - 0.2 %   Neutrophils Relative % 87 %   Neutro Abs 8.0 (H) 1.7 - 7.7 K/uL   Lymphocytes Relative 8 %   Lymphs Abs 0.7 0.7 - 4.0 K/uL   Monocytes Relative 5 %   Monocytes Absolute 0.5 0.1 - 1.0 K/uL   Eosinophils Relative 0 %   Eosinophils Absolute 0.0 0.0 - 0.5 K/uL   Basophils Relative 0 %   Basophils Absolute 0.0 0.0 - 0.1 K/uL   Immature Granulocytes 0 %   Abs Immature Granulocytes 0.04 0.00 - 0.07 K/uL    Comment: Performed at [facility], [place] [address], [place], [place] [address]  I-stat chem 8, ed     Status: Abnormal   Collection Time: 05/09/18  5:54 PM  Result Value Ref Range   Sodium 136 135 - 145 mmol/L   Potassium 4.8 3.5 - 5.1 mmol/L   Chloride 101 98 - 111 mmol/L   BUN 26 (H) 8 - 23 mg/dL   Creatinine, Ser 1.10 (H) 0.44 - 1.00 mg/dL   Glucose, Bld 124 (H) 70 - 99 mg/dL   Calcium, Ion 1.11 (L) 1.15 - 1.40 mmol/L   TCO2 31 22 - 32 mmol/L   Hemoglobin 12.9 12.0 - 15.0 g/dL   HCT 38.0 36.0 - 46.0 %    Chemistries  Recent Labs  Lab 05/09/18 1754  NA 136  K 4.8  CL 101  GLUCOSE 124*  BUN 26*  CREATININE 1.10*    ------------------------------------------------------------------------------------------------------------------  ------------------------------------------------------------------------------------------------------------------  GFR: Estimated Creatinine Clearance: 28.8 mL/min (A) (by C-G formula based on SCr of 1.1 mg/dL (H)). Liver Function Tests: No results for input(s): AST, ALT, ALKPHOS, BILITOT, PROT, ALBUMIN in the last 168 hours. No results for input(s): LIPASE, AMYLASE in the last 168 hours. No results for input(s): AMMONIA in the last 168 hours. Coagulation Profile: No results for input(s): INR, PROTIME in the last 168 hours. Cardiac Enzymes: No results for input(s): CKTOTAL, CKMB, CKMBINDEX, TROPONINI in the last 168 hours. BNP (last 3 results) No results for input(s): PROBNP in the last 8760 hours. HbA1C: No results for input(s): HGBA1C in the last 72 hours. CBG: No results for input(s): GLUCAP in the last 168 hours. Lipid Profile: No results for input(s): CHOL, HDL, LDLCALC, TRIG, CHOLHDL, LDLDIRECT in the last 72 hours. Thyroid Function Tests: No results for input(s): TSH, T4TOTAL, FREET4, T3FREE, THYROIDAB in the last 72 hours. Anemia Panel: No results for input(s): VITAMINB12, FOLATE, FERRITIN, TIBC, IRON, RETICCTPCT in the last 72 hours.  --------------------------------------------------------------------------------------------------------------- Urine analysis:    Component Value Date/Time   COLORURINE YELLOW 11/13/2015 1545   APPEARANCEUR CLOUDY (A) 11/13/2015 1545   LABSPEC 1.014 11/13/2015 1545   PHURINE 6.5 11/13/2015 1545   GLUCOSEU NEGATIVE 11/13/2015 1545   HGBUR MODERATE (A) 11/13/2015 1545   BILIRUBINUR NEGATIVE 11/13/2015 1545   KETONESUR 15 (A) 11/13/2015 1545   PROTEINUR NEGATIVE 11/13/2015 1545   NITRITE NEGATIVE 11/13/2015 1545   LEUKOCYTESUR NEGATIVE 11/13/2015 1545      Imaging Results:    Dg Knee Complete 4 Views Right  Result  Date: 05/09/2018 CLINICAL DATA:  Initial evaluation for acute trauma, fall. EXAM: RIGHT KNEE - COMPLETE 4+ VIEW COMPARISON:  None. FINDINGS: No acute fracture or dislocation. No joint effusion. Moderate tricompartmental degenerative osteoarthrosis. Associated chondrocalcinosis. No acute soft tissue abnormality. Bones mildly osteopenic. IMPRESSION: 1. No acute osseous abnormality about the knee. 2. Moderate tricompartmental degenerative osteoarthrosis. Electronically Signed   By: Jeannine Boga M.D.   On: 05/09/2018 16:46   Dg Hip Unilat  With Pelvis 2-3 Views Right  Result Date: 05/09/2018 CLINICAL DATA:  Fall.  Right hip and knee pain. EXAM: DG HIP (WITH OR WITHOUT PELVIS) 2-3V RIGHT COMPARISON:  Right femur 05/09/2018 FINDINGS: Subcapital fracture involving the proximal right femur. Fracture is displaced with superior displacement of the right femoral trochanters. There may be a small bone fragment near the superolateral aspect of the right femoral head. Right femoral head appears to be located. Pelvic bony ring is intact. Sclerosis at the symphysis pubis. Joint space narrowing involving the superior left hip joint. Curvature and degenerative changes in the lower lumbar spine. IMPRESSION: Right femoral subcapital fracture. Electronically Signed   By: Markus Daft M.D.   On: 05/09/2018 16:47   Dg Femur 1v Right  Result Date: 05/09/2018 CLINICAL DATA:  Initial evaluation for acute trauma, fall. EXAM: RIGHT FEMUR 1 VIEW COMPARISON:  None. FINDINGS: Acute fracture extending through the subcapital right femoral neck partially visualized. Associated mild superior subluxation. Finding better evaluated on concomitant radiograph of the right hip. Subtrochanteric femur otherwise intact. No other acute osseous abnormality. Moderate to advanced degenerative osteoarthrosis noted about the knee. IMPRESSION: 1. Acute fracture involving the subcapital right femoral neck with mild superior subluxation, better evaluated  on concomitant radiograph of the right hip. 2. No other acute osseous abnormality about the femur. Electronically Signed   By: Jeannine Boga M.D.   On: 05/09/2018 16:44      Assessment & Plan:    Active Problems:  Hip fracture ([place])   1. Hip fracture-admit to inpatient, start hip fracture protocol.  Orthopedics has been consulted.  Likely surgery tonight or tomorrow morning.  2. ?  Dementia-patient is on Aricept, will continue with Aricept at this time.  3. Risk stratification-patient has no significant medical problems no history of CAD no history of CVA.  Patient is a high risk based on her age.  Would recommend surgery for pain control and quality of life.  Will obtain EKG and chest x-ray as per hip fracture protocol.    DVT Prophylaxis-   Lovenox  AM Labs Ordered, also please review Full Orders  Family Communication: Admission, patients condition and plan of care including tests being ordered have been discussed with the patient and her daughter and son-in-law at bedside* who indicate understanding and agree with the plan and Code Status.  Code Status: DNR  Admission status: Inpatient: Based on patients clinical presentation and evaluation of above clinical data, I have made determination that patient meets Inpatient criteria at this time.  Patient admitted for hip fracture, started on heparin protocol.  Time spent in minutes : 60 minutes   Oswald Hillock M.D on 05/09/2018 at 6:27 PM  Between 7am to 7pm - Pager - [phone]. After 7pm go to [email or web address] - password [facility]  [facility] - Office  [phone]

## 2018-05-09 NOTE — ED Provider Notes (Signed)
[facility] DEPT Provider Note   CSN: [id number] Arrival date & time: 05/09/18  1535     History   Chief Complaint Chief Complaint  Patient presents with  . Leg Pain  . Hip Pain  . Knee Pain  . Fall    HPI Nichole [name] is a 84 y.o. female.  Patient fell and complains of right hip pain.  Patient did not hit her head.  Her daughter was with her and said she is got slipped to the ground  The history is provided by the patient. No language interpreter was used.  Leg Pain   This is a new problem. The current episode started 3 to 5 hours ago. The problem occurs rarely. The problem has been resolved. Pain location: Right hip pain. The quality of the pain is described as aching. The pain is at a severity of 7/10. The pain is moderate. Associated symptoms include limited range of motion. Exacerbated by: Movement. She has tried nothing for the symptoms. The treatment provided no relief.  Hip Pain  Pertinent negatives include no chest pain, no abdominal pain and no headaches.  Knee Pain   Associated symptoms include limited range of motion.  Fall  Pertinent negatives include no chest pain, no abdominal pain and no headaches.    Past Medical History:  Diagnosis Date  . Alzheimer disease ([place]) 08/25/2017  . Breast CA ([place])   . TIA (transient ischemic attack) 2009    Patient Active Problem List   Diagnosis Date Noted  . Alzheimer disease ([place]) 08/25/2017  . SIADH (syndrome of inappropriate ADH production) ([place])   . HTN (hypertension) 11/16/2015  . Hyponatremia 11/13/2015    Past Surgical History:  Procedure Laterality Date  . BREAST SURGERY    . MASTECTOMY Left 1995     OB History   None      Home Medications    Prior to Admission medications   Medication Sig Start Date End Date Taking? Authorizing Provider  aspirin 81 MG chewable tablet Chew 81 mg by mouth daily.     [provider]  co-enzyme Q-10 30 MG capsule Take 30  mg by mouth 3 (three) times daily.    [provider]  Multiple Vitamins-Minerals (CENTRUM SILVER PO) Take 1 tablet by mouth daily.    [provider]  Omega-3 Fatty Acids (FISH OIL) 1000 MG CAPS Take 1 capsule by mouth daily.     [provider]  sodium chloride 1 g tablet Take 1 tablet (1 g total) by mouth 2 (two) times daily with a meal. Patient not taking: Reported on 08/25/2017 11/17/15   Barton Dubois, MD  traMADol (ULTRAM) 50 MG tablet Take 1 tablet (50 mg total) by mouth every 8 (eight) hours as needed for severe pain. Patient not taking: Reported on 08/25/2017 11/17/15   Barton Dubois, MD    Family History Family History  Problem Relation Age of Onset  . Ovarian cancer Sister   . Breast cancer Sister   . Colon cancer Mother   . AAA (abdominal aortic aneurysm) Mother   . Coronary artery disease Father     Social History Social History   Tobacco Use  . Smoking status: Never Smoker  . Smokeless tobacco: Never Used  Substance Use Topics  . Alcohol use: No  . Drug use: No     Allergies   Patient has no known allergies.   Review of Systems Review of Systems  Constitutional: Negative for appetite change  and fatigue.  HENT: Negative for congestion, ear discharge and sinus pressure.   Eyes: Negative for discharge.  Respiratory: Negative for cough.   Cardiovascular: Negative for chest pain.  Gastrointestinal: Negative for abdominal pain and diarrhea.  Genitourinary: Negative for frequency and hematuria.  Musculoskeletal: Negative for back pain.       Right hip pain  Skin: Negative for rash.  Neurological: Negative for seizures and headaches.  Psychiatric/Behavioral: Negative for hallucinations.     Physical Exam Updated Vital Signs BP (!) 178/62 (BP Location: Left Arm)   Pulse 87   Temp 97.8 F (36.6 C) (Oral)   Resp 14   Ht 5\' 5"  (1.651 m)   Wt 61.2 kg   SpO2 97%   BMI 22.47 kg/m   Physical Exam  Constitutional: She appears  well-developed.  HENT:  Head: Normocephalic.  Eyes: Conjunctivae and EOM are normal. No scleral icterus.  Neck: Neck supple. No thyromegaly present.  Cardiovascular: Normal rate and regular rhythm. Exam reveals no gallop and no friction rub.  No murmur heard. Pulmonary/Chest: No stridor. She has no wheezes. She has no rales. She exhibits no tenderness.  Abdominal: She exhibits no distension. There is no tenderness. There is no rebound.  Musculoskeletal: She exhibits no edema.  The right hip  Lymphadenopathy:    She has no cervical adenopathy.  Neurological: She is alert. She exhibits normal muscle tone. Coordination normal.  Patient oriented to person and place but not situation  Skin: No rash noted. No erythema.  Psychiatric: She has a normal mood and affect. Her behavior is normal.     ED Treatments / Results  Labs (all labs ordered are listed, but only abnormal results are displayed) Labs Reviewed  CBC WITH DIFFERENTIAL/PLATELET  COMPREHENSIVE METABOLIC PANEL  I-STAT CHEM 8, ED  TYPE AND SCREEN    EKG None  Radiology Dg Knee Complete 4 Views Right  Result Date: 05/09/2018 CLINICAL DATA:  Initial evaluation for acute trauma, fall. EXAM: RIGHT KNEE - COMPLETE 4+ VIEW COMPARISON:  None. FINDINGS: No acute fracture or dislocation. No joint effusion. Moderate tricompartmental degenerative osteoarthrosis. Associated chondrocalcinosis. No acute soft tissue abnormality. Bones mildly osteopenic. IMPRESSION: 1. No acute osseous abnormality about the knee. 2. Moderate tricompartmental degenerative osteoarthrosis. Electronically Signed   By: Jeannine Boga M.D.   On: 05/09/2018 16:46   Dg Hip Unilat  With Pelvis 2-3 Views Right  Result Date: 05/09/2018 CLINICAL DATA:  Fall.  Right hip and knee pain. EXAM: DG HIP (WITH OR WITHOUT PELVIS) 2-3V RIGHT COMPARISON:  Right femur 05/09/2018 FINDINGS: Subcapital fracture involving the proximal right femur. Fracture is displaced with  superior displacement of the right femoral trochanters. There may be a small bone fragment near the superolateral aspect of the right femoral head. Right femoral head appears to be located. Pelvic bony ring is intact. Sclerosis at the symphysis pubis. Joint space narrowing involving the superior left hip joint. Curvature and degenerative changes in the lower lumbar spine. IMPRESSION: Right femoral subcapital fracture. Electronically Signed   By: Markus Daft M.D.   On: 05/09/2018 16:47   Dg Femur 1v Right  Result Date: 05/09/2018 CLINICAL DATA:  Initial evaluation for acute trauma, fall. EXAM: RIGHT FEMUR 1 VIEW COMPARISON:  None. FINDINGS: Acute fracture extending through the subcapital right femoral neck partially visualized. Associated mild superior subluxation. Finding better evaluated on concomitant radiograph of the right hip. Subtrochanteric femur otherwise intact. No other acute osseous abnormality. Moderate to advanced degenerative osteoarthrosis noted about the knee. IMPRESSION:  1. Acute fracture involving the subcapital right femoral neck with mild superior subluxation, better evaluated on concomitant radiograph of the right hip. 2. No other acute osseous abnormality about the femur. Electronically Signed   By: Jeannine Boga M.D.   On: 05/09/2018 16:44    Procedures Procedures (including critical care time)  Medications Ordered in ED Medications - No data to display   Initial Impression / Assessment and Plan / ED Course  I have reviewed the triage vital signs and the nursing notes.  Pertinent labs & imaging results that were available during my care of the patient were reviewed by me and considered in my medical decision making (see chart for details).     Mild to moderate dementia and right hip fracture.  She will be admitted to medicine and orthopedics will see the patient  Final Clinical Impressions(s) / ED Diagnoses   Final diagnoses:  Closed fracture of right hip,  initial encounter [facility])    ED Discharge Orders    None       Milton Ferguson, MD 05/09/18 1756

## 2018-05-09 NOTE — Telephone Encounter (Signed)
Noted/fim 

## 2018-05-09 NOTE — Telephone Encounter (Signed)
This RN went to patient who was in car in front of office. Her daughter, Nichole [name] was the dirver. Nichole [name] asked for help to get her mother into wheelchair to come inside for FU. Nichole [name] stated her mother lives at [facility], and when Nichole [name] was walking her mother to the car, her mother fell. It took three staff members to get her mother up and into the car.  The patient is complaining of pain from her right hip to right knee. This RN advised she will not assist the patient because of the risk of hip fracture. This RN strongly advised daughter to take her to the ED to be evaluated.  The daughter stated her mother needs to be seen here also and didn't want the appointment to be far out. This RN advised she call Dr Jannifer Franklin' RN when she knows the status of her mother. At that time it can be determined when her mother can come in for a follow up. Nichole [name] verbalized understanding, appreciation, stated she was going to take her mother to the ED.  This RN wished them well.

## 2018-05-09 NOTE — ED Triage Notes (Signed)
Pt reports on her way to car tripped and fell hurting right hip, right knee and right thigh. Denies LOC or taking blood thinners.

## 2018-05-09 NOTE — ED Notes (Signed)
Bed: UL24 Expected date:  Expected time:  Means of arrival:  Comments: HALL C

## 2018-05-09 NOTE — H&P (View-Only) (Signed)
Reason for Consult: Right hip pain Referring Physician: Dr. Marita [place] Nichole is an 84 y.o. [name].  HPI: Nichole [name] is a 84 year old [name] with past medical history significant for dementia.  Nichole [name] went with Nichole [name] daughter to a doctor's appointment today and fell injuring Nichole [name] right hip.  Nichole [name] complains of aching pain in Nichole hip that has gotten worse as Nichole [name] has been lying on Nichole stretcher.  It hurts with sharp pain with any motion.  Nichole [name] lives independently at [facility].  Nichole [name] is not diabetic.  Nichole [name] is not a smoker.  No history of injury or surgery to Nichole [name] right hip.  Nichole [name] is accompanied by Nichole [name] daughter.  History is provided by Nichole [name]'s family and review of Nichole records.  Past Medical History:  Diagnosis Date  . Alzheimer disease ([place]) 08/25/2017  . Breast CA ([place])   . TIA (transient ischemic attack) 2009    Past Surgical History:  Procedure Laterality Date  . BREAST SURGERY    . MASTECTOMY Left 1995    Family History  Problem Relation Age of Onset  . Ovarian cancer Sister   . Breast cancer Sister   . Colon cancer Mother   . AAA (abdominal aortic aneurysm) Mother   . Coronary artery disease Father     Social History:  reports that Nichole [name] has never smoked. Nichole [name] has never used smokeless tobacco. Nichole [name] reports that Nichole [name] does not drink alcohol or use drugs.  Allergies: No Known Allergies  Medications: I have reviewed Nichole [name]'s current medications.  Results for orders placed or performed during Nichole hospital encounter of 05/09/18 (from Nichole past 48 hour(s))  CBC with Differential/Platelet     Status: Abnormal   Collection Time: 05/09/18  5:43 PM  Result Value Ref Range   WBC 9.3 4.0 - 10.5 K/uL   RBC 3.99 3.87 - 5.11 MIL/uL   Hemoglobin 12.5 12.0 - 15.0 g/dL   HCT 39.6 36.0 - 46.0 %   MCV 99.2 80.0 - 100.0 fL   MCH 31.3 26.0 - 34.0 pg   MCHC 31.6 30.0 - 36.0 g/dL   RDW 13.3 11.5 - 15.5 %   Platelets 223 150 - 400 K/uL   nRBC 0.0 0.0 - 0.2 %   Neutrophils Relative % 87 %    Neutro Abs 8.0 (H) 1.7 - 7.7 K/uL   Lymphocytes Relative 8 %   Lymphs Abs 0.7 0.7 - 4.0 K/uL   Monocytes Relative 5 %   Monocytes Absolute 0.5 0.1 - 1.0 K/uL   Eosinophils Relative 0 %   Eosinophils Absolute 0.0 0.0 - 0.5 K/uL   Basophils Relative 0 %   Basophils Absolute 0.0 0.0 - 0.1 K/uL   Immature Granulocytes 0 %   Abs Immature Granulocytes 0.04 0.00 - 0.07 K/uL    Comment: Performed at [facility], [place] [address], [place], [place] [address]  Comprehensive metabolic panel     Status: Abnormal   Collection Time: 05/09/18  5:43 PM  Result Value Ref Range   Sodium 137 135 - 145 mmol/L   Potassium 4.4 3.5 - 5.1 mmol/L   Chloride 100 98 - 111 mmol/L   CO2 28 22 - 32 mmol/L   Glucose, Bld 126 (H) 70 - 99 mg/dL   BUN 21 8 - 23 mg/dL   Creatinine, Ser 1.11 (H) 0.44 - 1.00 mg/dL   Calcium 9.6 8.9 - 10.3 mg/dL   Total Protein 8.0 6.5 - 8.1 g/dL   Albumin 4.2 3.5 -  5.0 g/dL   AST 39 15 - 41 U/L   ALT 26 0 - 44 U/L   Alkaline Phosphatase 77 38 - 126 U/L   Total Bilirubin 0.8 0.3 - 1.2 mg/dL   GFR calc non Af Amer 41 (L) >60 mL/min   GFR calc Af Amer 48 (L) >60 mL/min    Comment: (NOTE) Nichole eGFR has been calculated using Nichole CKD EPI equation. This calculation has not been validated in all clinical situations. eGFR's persistently <60 mL/min signify possible Chronic Kidney Disease.    Anion gap 9 5 - 15    Comment: Performed at [facility], [place] [address] [place], [place] [address]  Type and screen     Status: None (Preliminary result)   Collection Time: 05/09/18  5:43 PM  Result Value Ref Range   ABO/RH(D) PENDING    Antibody Screen PENDING    Sample Expiration      05/12/2018 Performed at [facility] [place] [address], [place], [place] [address]   I-stat chem 8, ed     Status: Abnormal   Collection Time: 05/09/18  5:54 PM  Result Value Ref Range   Sodium 136 135 - 145 mmol/L   Potassium 4.8 3.5 - 5.1 mmol/L    Chloride 101 98 - 111 mmol/L   BUN 26 (H) 8 - 23 mg/dL   Creatinine, Ser 1.10 (H) 0.44 - 1.00 mg/dL   Glucose, Bld 124 (H) 70 - 99 mg/dL   Calcium, Ion 1.11 (L) 1.15 - 1.40 mmol/L   TCO2 31 22 - 32 mmol/L   Hemoglobin 12.9 12.0 - 15.0 g/dL   HCT 38.0 36.0 - 46.0 %    Dg Knee Complete 4 Views Right  Result Date: 05/09/2018 CLINICAL DATA:  Initial evaluation for acute trauma, fall. EXAM: RIGHT KNEE - COMPLETE 4+ VIEW COMPARISON:  None. FINDINGS: No acute fracture or dislocation. No joint effusion. Moderate tricompartmental degenerative osteoarthrosis. Associated chondrocalcinosis. No acute soft tissue abnormality. Bones mildly osteopenic. IMPRESSION: 1. No acute osseous abnormality about Nichole knee. 2. Moderate tricompartmental degenerative osteoarthrosis. Electronically Signed   By: Jeannine Boga M.D.   On: 05/09/2018 16:46   Dg Hip Unilat  With Pelvis 2-3 Views Right  Result Date: 05/09/2018 CLINICAL DATA:  Fall.  Right hip and knee pain. EXAM: DG HIP (WITH OR WITHOUT PELVIS) 2-3V RIGHT COMPARISON:  Right femur 05/09/2018 FINDINGS: Subcapital fracture involving Nichole proximal right femur. Fracture is displaced with superior displacement of Nichole right femoral trochanters. There may be a small bone fragment near Nichole superolateral aspect of Nichole right femoral head. Right femoral head appears to be located. Pelvic bony ring is intact. Sclerosis at Nichole symphysis pubis. Joint space narrowing involving Nichole superior left hip joint. Curvature and degenerative changes in Nichole lower lumbar spine. IMPRESSION: Right femoral subcapital fracture. Electronically Signed   By: Markus Daft M.D.   On: 05/09/2018 16:47   Dg Femur 1v Right  Result Date: 05/09/2018 CLINICAL DATA:  Initial evaluation for acute trauma, fall. EXAM: RIGHT FEMUR 1 VIEW COMPARISON:  None. FINDINGS: Acute fracture extending through Nichole subcapital right femoral neck partially visualized. Associated mild superior subluxation. Finding better  evaluated on concomitant radiograph of Nichole right hip. Subtrochanteric femur otherwise intact. No other acute osseous abnormality. Moderate to advanced degenerative osteoarthrosis noted about Nichole knee. IMPRESSION: 1. Acute fracture involving Nichole subcapital right femoral neck with mild superior subluxation, better evaluated on concomitant radiograph of Nichole right hip. 2. No other acute osseous  abnormality about Nichole femur. Electronically Signed   By: Jeannine Boga M.D.   On: 07-Jun-2018 16:44    ROS: No recent fever, chills, nausea, vomiting or changes in Nichole [name] appetite PE:  Blood pressure (!) 178/62, pulse 87, temperature 97.8 F (36.6 C), temperature source Oral, resp. rate 14, height _0  (1.651 m), weight 61.2 kg, SpO2 97 %. Well-nourished well-developed elderly woman in no apparent distress.  Alert and oriented x4.  Mood and affect are normal.  Extraocular motions are intact.  Respirations are unlabored.  Right lower extremity is shortened and slightly externally rotated.  2+ dorsalis pedis pulse.  Intact sensibility to light touch dorsally and plantarly at Nichole forefoot.  5 out of 5 strength in plantar flexion and dorsiflexion of Nichole toes.  No lymphadenopathy.  Pain with logroll at Nichole right hip.  Assessment/Plan: Right femoral neck displaced fracture -to Nichole operating room tomorrow for right hip hemiarthroplasty with Dr. Lyla Glassing.  N.p.o. after midnight.  Hold blood thinners.  I explained Nichole nature of Nichole injury to Nichole [name] and Nichole [name] family in detail.  We discussed Nichole risks and benefits of Nichole alternative treatment options.  Nichole [name] and Nichole [name] family elect operative treatment for this displaced and unstable hip fracture.  They understand Nichole risks and benefits of Nichole alternative treatment options including specifically risks of bleeding, infection, nerve damage, blood clots, continued pain and death.  Wylene Simmer 2018-06-07, 6:59 PM

## 2018-05-10 ENCOUNTER — Ambulatory Visit: Payer: Self-pay | Admitting: Adult Health

## 2018-05-10 ENCOUNTER — Ambulatory Visit: Payer: Self-pay | Admitting: Neurology

## 2018-05-10 ENCOUNTER — Encounter (HOSPITAL_COMMUNITY)
Admission: EM | Disposition: A | Discharge status: Skilled Nursing Facility | Payer: Self-pay | Source: Home / Self Care | Attending: Family Medicine

## 2018-05-10 ENCOUNTER — Inpatient Hospital Stay (HOSPITAL_COMMUNITY): Payer: Medicare Other

## 2018-05-10 ENCOUNTER — Inpatient Hospital Stay: Admit: 2018-05-10 | Payer: Medicare Other | Admitting: Orthopedic Surgery

## 2018-05-10 ENCOUNTER — Inpatient Hospital Stay (HOSPITAL_COMMUNITY): Payer: Medicare Other | Admitting: Anesthesiology

## 2018-05-10 ENCOUNTER — Encounter (HOSPITAL_COMMUNITY): Payer: Self-pay | Admitting: Anesthesiology

## 2018-05-10 DIAGNOSIS — S72001A Fracture of unspecified part of neck of right femur, initial encounter for closed fracture: Secondary | ICD-10-CM | POA: Diagnosis present

## 2018-05-10 HISTORY — PX: ANTERIOR APPROACH HEMI HIP ARTHROPLASTY: SHX6690

## 2018-05-10 LAB — CBC
HCT: 40.2 % (ref 36.0–46.0)
Hemoglobin: 12.8 g/dL (ref 12.0–15.0)
MCH: 31.4 pg (ref 26.0–34.0)
MCHC: 31.8 g/dL (ref 30.0–36.0)
MCV: 98.8 fL (ref 80.0–100.0)
Platelets: 192 10*3/uL (ref 150–400)
RBC: 4.07 MIL/uL (ref 3.87–5.11)
RDW: 13.3 % (ref 11.5–15.5)
WBC: 11.1 10*3/uL — AB (ref 4.0–10.5)
nRBC: 0 % (ref 0.0–0.2)

## 2018-05-10 LAB — CREATININE, SERUM
Creatinine, Ser: 1.01 mg/dL — ABNORMAL HIGH (ref 0.44–1.00)
GFR calc non Af Amer: 46 mL/min — ABNORMAL LOW (ref >60)
GFR, EST AFRICAN AMERICAN: 54 mL/min — AB (ref >60)

## 2018-05-10 LAB — SURGICAL PCR SCREEN
MRSA, PCR: NEGATIVE
STAPHYLOCOCCUS AUREUS: NEGATIVE

## 2018-05-10 SURGERY — HEMIARTHROPLASTY, HIP, DIRECT ANTERIOR APPROACH, FOR FRACTURE
Anesthesia: Spinal | Site: Hip | Laterality: Right

## 2018-05-10 MED ORDER — CEFAZOLIN SODIUM-DEXTROSE 2-4 GM/100ML-% IV SOLN
INTRAVENOUS | Status: AC
  Filled 2018-05-10: qty 100

## 2018-05-10 MED ORDER — BUPIVACAINE-EPINEPHRINE (PF) 0.25% -1:200000 IJ SOLN
INTRAMUSCULAR | Status: AC
  Filled 2018-05-10: qty 30

## 2018-05-10 MED ORDER — MEPERIDINE HCL 50 MG/ML IJ SOLN: 6.2500 mg | INTRAMUSCULAR | Status: DC | PRN

## 2018-05-10 MED ORDER — PROPOFOL 500 MG/50ML IV EMUL
INTRAVENOUS | Status: DC | PRN
  Administered 2018-05-10: 25 ug/kg/min via INTRAVENOUS

## 2018-05-10 MED ORDER — SENNA 8.6 MG PO TABS
1.0000 | ORAL_TABLET | Freq: Two times a day (BID) | ORAL | Status: DC
  Administered 2018-05-10 – 2018-05-12 (×4): 8.6 mg via ORAL
  Filled 2018-05-10 (×4): qty 1

## 2018-05-10 MED ORDER — ACETAMINOPHEN 325 MG PO TABS: 325.0000 mg | ORAL_TABLET | Freq: Four times a day (QID) | ORAL | Status: DC | PRN

## 2018-05-10 MED ORDER — KETAMINE HCL 10 MG/ML IJ SOLN
INTRAMUSCULAR | Status: AC
  Filled 2018-05-10: qty 1

## 2018-05-10 MED ORDER — HYDROCODONE-ACETAMINOPHEN 7.5-325 MG PO TABS: 1.0000 | ORAL_TABLET | ORAL | Status: DC | PRN

## 2018-05-10 MED ORDER — BUPIVACAINE-EPINEPHRINE 0.25% -1:200000 IJ SOLN
INTRAMUSCULAR | Status: DC | PRN
  Administered 2018-05-10: 30 mL

## 2018-05-10 MED ORDER — FENTANYL CITRATE (PF) 100 MCG/2ML IJ SOLN
INTRAMUSCULAR | Status: AC
  Filled 2018-05-10: qty 2

## 2018-05-10 MED ORDER — METOCLOPRAMIDE HCL 5 MG PO TABS: 5.0000 mg | ORAL_TABLET | Freq: Three times a day (TID) | ORAL | Status: DC | PRN

## 2018-05-10 MED ORDER — LACTATED RINGERS IV SOLN
INTRAVENOUS | Status: DC
  Administered 2018-05-10 (×2): via INTRAVENOUS

## 2018-05-10 MED ORDER — DOCUSATE SODIUM 100 MG PO CAPS
100.0000 mg | ORAL_CAPSULE | Freq: Two times a day (BID) | ORAL | Status: DC
  Administered 2018-05-10 – 2018-05-12 (×4): 100 mg via ORAL
  Filled 2018-05-10 (×4): qty 1

## 2018-05-10 MED ORDER — ONDANSETRON HCL 4 MG/2ML IJ SOLN
INTRAMUSCULAR | Status: DC | PRN
  Administered 2018-05-10: 4 mg via INTRAVENOUS

## 2018-05-10 MED ORDER — KETAMINE HCL 10 MG/ML IJ SOLN
INTRAMUSCULAR | Status: DC | PRN
  Administered 2018-05-10: 30 mg via INTRAVENOUS

## 2018-05-10 MED ORDER — PROPOFOL 10 MG/ML IV BOLUS
INTRAVENOUS | Status: AC
  Filled 2018-05-10: qty 40

## 2018-05-10 MED ORDER — SODIUM CHLORIDE 0.9 % IJ SOLN
INTRAMUSCULAR | Status: AC
  Filled 2018-05-10: qty 50

## 2018-05-10 MED ORDER — PHENYLEPHRINE HCL 10 MG/ML IJ SOLN
INTRAMUSCULAR | Status: AC
  Filled 2018-05-10: qty 2

## 2018-05-10 MED ORDER — PROMETHAZINE HCL 25 MG/ML IJ SOLN: 6.2500 mg | INTRAMUSCULAR | Status: DC | PRN

## 2018-05-10 MED ORDER — SODIUM CHLORIDE 0.9 % IR SOLN
Status: DC | PRN
  Administered 2018-05-10: 1000 mL
  Administered 2018-05-10: 3000 mL

## 2018-05-10 MED ORDER — METOCLOPRAMIDE HCL 5 MG/ML IJ SOLN: 5.0000 mg | Freq: Three times a day (TID) | INTRAMUSCULAR | Status: DC | PRN

## 2018-05-10 MED ORDER — ENOXAPARIN SODIUM 30 MG/0.3ML ~~LOC~~ SOLN
30.0000 mg | SUBCUTANEOUS | Status: DC
  Administered 2018-05-11 – 2018-05-12 (×2): 30 mg via SUBCUTANEOUS
  Filled 2018-05-10 (×2): qty 0.3

## 2018-05-10 MED ORDER — SODIUM CHLORIDE 0.9 % IV SOLN
INTRAVENOUS | Status: DC | PRN
  Administered 2018-05-10: 25 ug/min via INTRAVENOUS

## 2018-05-10 MED ORDER — TRANEXAMIC ACID-NACL 1000-0.7 MG/100ML-% IV SOLN
INTRAVENOUS | Status: DC | PRN
  Administered 2018-05-10: 1000 mg via INTRAVENOUS

## 2018-05-10 MED ORDER — MENTHOL 3 MG MT LOZG: 1.0000 | LOZENGE | OROMUCOSAL | Status: DC | PRN

## 2018-05-10 MED ORDER — SODIUM CHLORIDE 0.9 % IJ SOLN
INTRAMUSCULAR | Status: DC | PRN
  Administered 2018-05-10: 30 mL

## 2018-05-10 MED ORDER — ONDANSETRON HCL 4 MG/2ML IJ SOLN
INTRAMUSCULAR | Status: AC
  Filled 2018-05-10: qty 2

## 2018-05-10 MED ORDER — HYDROCODONE-ACETAMINOPHEN 5-325 MG PO TABS: 1.0000 | ORAL_TABLET | ORAL | Status: DC | PRN

## 2018-05-10 MED ORDER — BUPIVACAINE IN DEXTROSE 0.75-8.25 % IT SOLN
INTRATHECAL | Status: DC | PRN
  Administered 2018-05-10: 1.8 mL via INTRATHECAL

## 2018-05-10 MED ORDER — ONDANSETRON HCL 4 MG/2ML IJ SOLN: 4.0000 mg | Freq: Four times a day (QID) | INTRAMUSCULAR | Status: DC | PRN

## 2018-05-10 MED ORDER — CEFAZOLIN SODIUM-DEXTROSE 2-4 GM/100ML-% IV SOLN
2.0000 g | Freq: Four times a day (QID) | INTRAVENOUS | Status: AC
  Administered 2018-05-10 – 2018-05-11 (×2): 2 g via INTRAVENOUS
  Filled 2018-05-10 (×2): qty 100

## 2018-05-10 MED ORDER — PHENOL 1.4 % MT LIQD
1.0000 | OROMUCOSAL | Status: DC | PRN
  Filled 2018-05-10: qty 177

## 2018-05-10 MED ORDER — ONDANSETRON HCL 4 MG PO TABS: 4.0000 mg | ORAL_TABLET | Freq: Four times a day (QID) | ORAL | Status: DC | PRN

## 2018-05-10 MED ORDER — FENTANYL CITRATE (PF) 100 MCG/2ML IJ SOLN: 25.0000 ug | INTRAMUSCULAR | Status: DC | PRN

## 2018-05-10 MED ORDER — ISOPROPYL ALCOHOL 70 % SOLN
Status: AC
  Filled 2018-05-10: qty 480

## 2018-05-10 MED ORDER — ISOPROPYL ALCOHOL 70 % SOLN
Status: DC | PRN
  Administered 2018-05-10: 1 via TOPICAL

## 2018-05-10 MED ORDER — MORPHINE SULFATE (PF) 2 MG/ML IV SOLN: 0.5000 mg | INTRAVENOUS | Status: DC | PRN

## 2018-05-10 MED ORDER — KETOROLAC TROMETHAMINE 30 MG/ML IJ SOLN
INTRAMUSCULAR | Status: DC | PRN
  Administered 2018-05-10: 30 mg via INTRAMUSCULAR

## 2018-05-10 MED ORDER — DEXAMETHASONE SODIUM PHOSPHATE 10 MG/ML IJ SOLN
INTRAMUSCULAR | Status: AC
  Filled 2018-05-10: qty 1

## 2018-05-10 MED ORDER — FENTANYL CITRATE (PF) 100 MCG/2ML IJ SOLN
INTRAMUSCULAR | Status: DC | PRN
  Administered 2018-05-10: 50 ug via INTRAVENOUS

## 2018-05-10 MED ORDER — DEXAMETHASONE SODIUM PHOSPHATE 10 MG/ML IJ SOLN
INTRAMUSCULAR | Status: DC | PRN
  Administered 2018-05-10: 10 mg via INTRAVENOUS

## 2018-05-10 MED ORDER — KETOROLAC TROMETHAMINE 30 MG/ML IJ SOLN
INTRAMUSCULAR | Status: AC
  Filled 2018-05-10: qty 1

## 2018-05-10 SURGICAL SUPPLY — 47 items
BAG DECANTER FOR FLEXI CONT (MISCELLANEOUS) IMPLANT
BAG ZIPLOCK 12X15 (MISCELLANEOUS) IMPLANT
CHLORAPREP W/TINT 26ML (MISCELLANEOUS) ×3 IMPLANT
CLOTH BEACON ORANGE TIMEOUT ST (SAFETY) ×3 IMPLANT
COVER PERINEAL POST (MISCELLANEOUS) ×3 IMPLANT
COVER SURGICAL LIGHT HANDLE (MISCELLANEOUS) ×3 IMPLANT
COVER WAND RF STERILE (DRAPES) ×2 IMPLANT
DECANTER SPIKE VIAL GLASS SM (MISCELLANEOUS) ×3 IMPLANT
DERMABOND ADVANCED (GAUZE/BANDAGES/DRESSINGS) ×4
DERMABOND ADVANCED .7 DNX12 (GAUZE/BANDAGES/DRESSINGS) ×2 IMPLANT
DRAPE SHEET LG 3/4 BI-LAMINATE (DRAPES) ×9 IMPLANT
DRAPE STERI IOBAN 125X83 (DRAPES) ×3 IMPLANT
DRAPE U-SHAPE 47X51 STRL (DRAPES) ×6 IMPLANT
DRSG AQUACEL AG ADV 3.5X10 (GAUZE/BANDAGES/DRESSINGS) ×3 IMPLANT
ELECT PENCIL ROCKER SW 15FT (MISCELLANEOUS) ×3 IMPLANT
ELECT REM PT RETURN 15FT ADLT (MISCELLANEOUS) ×3 IMPLANT
GAUZE SPONGE 4X4 12PLY STRL (GAUZE/BANDAGES/DRESSINGS) ×3 IMPLANT
GLOVE BIO SURGEON STRL SZ8.5 (GLOVE) ×6 IMPLANT
GLOVE BIOGEL PI IND STRL 8.5 (GLOVE) ×1 IMPLANT
GLOVE BIOGEL PI INDICATOR 8.5 (GLOVE) ×2
GOWN SPEC L3 XXLG W/TWL (GOWN DISPOSABLE) ×3 IMPLANT
HANDPIECE INTERPULSE COAX TIP (DISPOSABLE) ×2
HEAD FEM UNIPOLAR 47 OD STRL (Hips) ×2 IMPLANT
HOLDER FOLEY CATH W/STRAP (MISCELLANEOUS) ×3 IMPLANT
HOOD PEEL AWAY FLYTE STAYCOOL (MISCELLANEOUS) ×12 IMPLANT
MARKER SKIN DUAL TIP RULER LAB (MISCELLANEOUS) ×3 IMPLANT
NDL SPNL 18GX3.5 QUINCKE PK (NEEDLE) ×1 IMPLANT
NEEDLE SPNL 18GX3.5 QUINCKE PK (NEEDLE) ×3 IMPLANT
PACK ANTERIOR HIP CUSTOM (KITS) ×3 IMPLANT
SAW OSC TIP CART 19.5X105X1.3 (SAW) ×3 IMPLANT
SEALER BIPOLAR AQUA 6.0 (INSTRUMENTS) ×3 IMPLANT
SET HNDPC FAN SPRY TIP SCT (DISPOSABLE) ×1 IMPLANT
SPACER DEPUY (Hips) ×2 IMPLANT
STEM TRI LOC GRIPTION SZ 5 STD IMPLANT
SUT ETHIBOND NAB CT1 #1 30IN (SUTURE) ×6 IMPLANT
SUT MNCRL AB 3-0 PS2 18 (SUTURE) ×3 IMPLANT
SUT MON AB 2-0 CT1 36 (SUTURE) ×6 IMPLANT
SUT STRATAFIX PDO 1 14 VIOLET (SUTURE) ×2
SUT STRATFX PDO 1 14 VIOLET (SUTURE) ×1
SUT VIC AB 2-0 CT1 27 (SUTURE) ×4
SUT VIC AB 2-0 CT1 TAPERPNT 27 (SUTURE) ×1 IMPLANT
SUTURE STRATFX PDO 1 14 VIOLET (SUTURE) ×1 IMPLANT
SYR 50ML LL SCALE MARK (SYRINGE) ×3 IMPLANT
TRAY FOLEY MTR SLVR 16FR STAT (SET/KITS/TRAYS/PACK) ×2 IMPLANT
TRI LOC GRIPTION SZ 5 STD ×3 IMPLANT
WATER STERILE IRR 1000ML POUR (IV SOLUTION) ×3 IMPLANT
YANKAUER SUCT BULB TIP 10FT TU (MISCELLANEOUS) ×3 IMPLANT

## 2018-05-10 NOTE — Progress Notes (Signed)
Report given to [facility] in Short Stay.

## 2018-05-10 NOTE — Op Note (Signed)
OPERATIVE REPORT  SURGEON: Rod Can, MD   ASSISTANT: Nehemiah Massed, PA-C.  PREOPERATIVE DIAGNOSIS: Displaced Right femoral neck fracture.   POSTOPERATIVE DIAGNOSIS: Displaced Right femoral neck fracture.   PROCEDURE: Right hip hemiarthroplasty, anterior approach.   IMPLANTS: DePuy Tri Lock stem, size 5, std offset, with a -3 mm spacer and a 47 mm monopolar head ball.  ANESTHESIA:  Spinal  ANTIBIOTICS: 2g ancef.  ESTIMATED BLOOD LOSS:-200 mL    DRAINS: None.  COMPLICATIONS: None   CONDITION: PACU - hemodynamically stable.   BRIEF CLINICAL NOTE: Nichole [name] is a 84 y.o. female with a displaced Right femoral neck fracture. The patient was admitted to the hospitalist service and underwent perioperative risk stratification and medical optimization. The risks, benefits, and alternatives to hemiarthroplasty were explained, and the patient elected to proceed.  PROCEDURE IN DETAIL: The patient was taken to the operating room and general anesthesia was induced on the hospital bed. The patient was then positioned on the [place] table. All bony prominences were well padded. The hip was prepped and draped in the normal sterile surgical fashion. A time-out was called verifying side and site of surgery. Antibiotics were given within 60 minutes of beginning the procedure.  The direct anterior approach to the hip was performed through the Hueter interval. Lateral femoral circumflex vessels were treated with the Auqumantys. The anterior capsule was exposed and an inverted T capsulotomy was made. Fracture hematoma was encountered and evacuated. The patient was found to have a comminuted Right subcapital femoral neck fracture. I freshened the femoral neck cut with a saw. I removed the femoral neck fragment. A corkscrew was placed into the head and the head was removed. This was passed to the back table and was measured.  Acetabular exposure was achieved. I examined the  articular cartilage which was intact. The labrum was intact. A 47 mm trial head was placed and found to have excellent fit.  I then gained femoral exposure taking care to protect the abductors and greater trochanter. This was performed using standard external rotation, extension, and adduction. The capsule was peeled off the inner aspect of the greater trochanter, taking care to preserve the short external rotators. A cookie cutter was used to enter the femoral canal, and then the femoral canal finder was used to confirm location. I then sequentially broached up to a size 5. Calcar planer was used on the femoral neck remnant. I paced a std neck and a 36 + 1.5 head ball.The hip was reduced. Leg lengths were checked fluoroscopically. The hip was dislocated and trial components were removed. I placed the real stem followed by the real spacer and head ball. A single reduction maneuver was performed and the hip was reduced. Fluoroscopy was used to confirm component position and leg lengths. At 90 degrees of external rotation and extension, the hip was stable to an anterior directed force.  The wound was copiously irrigated with normal saline solution. Marcaine solution was injected into the periarticular soft tissue. The wound was closed in layers using #1 Vicryl and V-Loc for the fascia, 2-0 Vicryl for the subcutaneous fat, 2-0 Monocryl for the deep dermal layer, 3-0 running Monocryl subcuticular stitch and glue for the skin. Once the glue was fully dried, an Aquacell Ag dressing was applied. The patient was then awakened from anesthesia and transported to the recovery room in stable condition. Sponge, needle, and instrument counts were correct at the end of the case x2. The patient tolerated the procedure well and there were  no known complications.  Please note that a surgical assistant was a medical necessity for this procedure to perform it in a safe and expeditious manner. Assistant was  necessary to provide appropriate retraction of vital neurovascular structures, to prevent femoral fracture, and to allow for anatomic placement of the prosthesis.

## 2018-05-10 NOTE — Discharge Instructions (Signed)
°Dr. Laressa Bolinger °Joint Replacement Specialist °[facility] °[address] °[place], [place] [address] °[phone] ° ° °TOTAL HIP REPLACEMENT POSTOPERATIVE DIRECTIONS ° ° ° °Hip Rehabilitation, Guidelines Following Surgery  ° °WEIGHT BEARING °Weight bearing as tolerated with assist device (walker, cane, etc) as directed, use it as long as suggested by your surgeon or therapist, typically at least 4-6 weeks. ° °The results of a hip operation are greatly improved after range of motion and muscle strengthening exercises. Follow all safety measures which are given to protect your hip. If any of these exercises cause increased pain or swelling in your joint, decrease the amount until you are comfortable again. Then slowly increase the exercises. Call your caregiver if you have problems or questions.  ° °HOME CARE INSTRUCTIONS  °Most of the following instructions are designed to prevent the dislocation of your new hip.  °Remove items at home which could result in a fall. This includes throw rugs or furniture in walking pathways.  °Continue medications as instructed at time of discharge. °· You may have some home medications which will be placed on hold until you complete the course of blood thinner medication. °· You may start showering once you are discharged home. Do not remove your dressing. °Do not put on socks or shoes without following the instructions of your caregivers.   °Sit on chairs with arms. Use the chair arms to help push yourself up when arising.  °Arrange for the use of a toilet seat elevator so you are not sitting low.  °· Walk with walker as instructed.  °You may resume a sexual relationship in one month or when given the OK by your caregiver.  °Use walker as long as suggested by your caregivers.  °You may put full weight on your legs and walk as much as is comfortable. °Avoid periods of inactivity such as sitting longer than an hour when not asleep. This helps prevent  blood clots.  °You may return to work once you are cleared by your surgeon.  °Do not drive a car for 6 weeks or until released by your surgeon.  °Do not drive while taking narcotics.  °Wear elastic stockings for two weeks following surgery during the day but you may remove then at night.  °Make sure you keep all of your appointments after your operation with all of your doctors and caregivers. You should call the office at the above phone number and make an appointment for approximately two weeks after the date of your surgery. °Please pick up a stool softener and laxative for home use as long as you are requiring pain medications. °· ICE to the affected hip every three hours for 30 minutes at a time and then as needed for pain and swelling. Continue to use ice on the hip for pain and swelling from surgery. You may notice swelling that will progress down to the foot and ankle.  This is normal after surgery.  Elevate the leg when you are not up walking on it.   °It is important for you to complete the blood thinner medication as prescribed by your doctor. °· Continue to use the breathing machine which will help keep your temperature down.  It is common for your temperature to cycle up and down following surgery, especially at night when you are not up moving around and exerting yourself.  The breathing machine keeps your lungs expanded and your temperature down. ° °RANGE OF MOTION AND STRENGTHENING EXERCISES  °These exercises are   designed to help you keep full movement of your hip joint. Follow your caregiver's or physical therapist's instructions. Perform all exercises about fifteen times, three times per day or as directed. Exercise both hips, even if you have had only one joint replacement. These exercises can be done on a training (exercise) mat, on the floor, on a table or on a bed. Use whatever works the best and is most comfortable for you. Use music or television while you are exercising so that the exercises  are a pleasant break in your day. This will make your life better with the exercises acting as a break in routine you can look forward to.  °Lying on your back, slowly slide your foot toward your buttocks, raising your knee up off the floor. Then slowly slide your foot back down until your leg is straight again.  °Lying on your back spread your legs as far apart as you can without causing discomfort.  °Lying on your side, raise your upper leg and foot straight up from the floor as far as is comfortable. Slowly lower the leg and repeat.  °Lying on your back, tighten up the muscle in the front of your thigh (quadriceps muscles). You can do this by keeping your leg straight and trying to raise your heel off the floor. This helps strengthen the largest muscle supporting your knee.  °Lying on your back, tighten up the muscles of your buttocks both with the legs straight and with the knee bent at a comfortable angle while keeping your heel on the floor.  ° °SKILLED REHAB INSTRUCTIONS: °If the patient is transferred to a skilled rehab facility following release from the hospital, a list of the current medications will be sent to the facility for the patient to continue.  When discharged from the skilled rehab facility, please have the facility set up the patient's Home Health Physical Therapy prior to being released. Also, the skilled facility will be responsible for providing the patient with their medications at time of release from the facility to include their pain medication and their blood thinner medication. If the patient is still at the rehab facility at time of the two week follow up appointment, the skilled rehab facility will also need to assist the patient in arranging follow up appointment in our office and any transportation needs. ° °MAKE SURE YOU:  °Understand these instructions.  °Will watch your condition.  °Will get help right away if you are not doing well or get worse. ° °Pick up stool softner and  laxative for home use following surgery while on pain medications. °Do not remove your dressing. °The dressing is waterproof--it is OK to take showers. °Continue to use ice for pain and swelling after surgery. °Do not use any lotions or creams on the incision until instructed by your surgeon. °Total Hip Protocol. ° ° °

## 2018-05-10 NOTE — Progress Notes (Signed)
[facility]  PROGRESS NOTE  Emmett Arntz PYK:[id number] DOB: Mar 14, 1923 DOA: 05/09/2018 PCP: Aretta Nip, MD   Brief HPI:   84 year old female with history of memory problems was brought to hospital after she had a fall at home.  X-ray of the hip showed right femoral subcapital fracture.    Subjective   Patient seen and examined this morning, denies any pain.  Plan for surgery today.   Assessment/Plan:     1. Hip fracture-seen by orthopedics, surgery today.  Follow orthopedics recommendations.  2. Dementia without behavior disturbance-continue Aricept    CBG: No results for input(s): GLUCAP in the last 168 hours.  CBC: Recent Labs  Lab 05/09/18 1743 05/09/18 1754  WBC 9.3  --   NEUTROABS 8.0*  --   HGB 12.5 12.9  HCT 39.6 38.0  MCV 99.2  --   PLT 223  --     Basic Metabolic Panel: Recent Labs  Lab 05/09/18 1743 05/09/18 1754  NA 137 136  K 4.4 4.8  CL 100 101  CO2 28  --   GLUCOSE 126* 124*  BUN 21 26*  CREATININE 1.11* 1.10*  CALCIUM 9.6  --      DVT prophylaxis: Lovenox  Code Status: DNR  Family Communication: No family at bedside  Disposition Plan: likely skilled nursing facility   Consultants:  Orthopedics  Procedures:     Antibiotics:   Anti-infectives (From admission, onward)   Start     Dose/Rate Route Frequency Ordered Stop   05/10/18 1054  ceFAZolin (ANCEF) 2-4 GM/100ML-% IVPB    Note to Pharmacy:  Randa Evens  : cabinet override      05/10/18 1054 05/10/18 1202   05/10/18 0600  [MAR Hold]  ceFAZolin (ANCEF) IVPB 2g/100 mL premix     (MAR Hold since Tue 05/10/2018 at 1031. Reason: Transfer to a Procedural area.)   2 g 200 mL/hr over 30 Minutes Intravenous On call to O.R. 05/09/18 2018 05/10/18 1202       Objective   Vitals:   05/09/18 1540 05/09/18 2224 05/10/18 0931  BP: (!) 178/62 (!) 168/64 (!) 155/62  Pulse: 87 76 87  Resp: 14 15 14   Temp: 97.8 F (36.6 C) 98.1 F (36.7 C) 98.2 F  (36.8 C)  TempSrc: Oral Oral Oral  SpO2: 97% 98% 95%  Weight: 61.2 kg    Height: 5\' 5"  (1.651 m)      Intake/Output Summary (Last 24 hours) at 05/10/2018 1343 Last data filed at 05/10/2018 1343 Gross per 24 hour  Intake 2140.1 ml  Output 750 ml  Net 1390.1 ml   Filed Weights   05/09/18 1540  Weight: 61.2 kg     Physical Examination:    General: Appears in no acute distress  Cardiovascular: S1-S2, regular  Respiratory: Clear to auscultation bilaterally  Abdomen: Soft, nontender, no organomegaly  Extremities: No edema in the lower extremities tenderness at right hip joint  Neurologic: Alert, oriented x3, no focal deficit noted     Data Reviewed: I have personally reviewed following labs and imaging studies   Recent Results (from the past 240 hour(s))  Surgical PCR screen     Status: None   Collection Time: 05/09/18 10:00 PM  Result Value Ref Range Status   MRSA, PCR NEGATIVE NEGATIVE Final   Staphylococcus aureus NEGATIVE NEGATIVE Final    Comment: (NOTE) The Xpert SA Assay (FDA approved for NASAL specimens in patients 20 years of age and older), is one component of a  comprehensive surveillance program. It is not intended to diagnose infection nor to guide or monitor treatment. Performed at [facility], [place] [address], [place], [place] [address]      Liver Function Tests: Recent Labs  Lab 05/09/18 1743  AST 39  ALT 26  ALKPHOS 77  BILITOT 0.8  PROT 8.0  ALBUMIN 4.2   No results for input(s): LIPASE, AMYLASE in the last 168 hours. No results for input(s): AMMONIA in the last 168 hours.  Cardiac Enzymes: No results for input(s): CKTOTAL, CKMB, CKMBINDEX, TROPONINI in the last 168 hours. BNP (last 3 results) No results for input(s): BNP in the last 8760 hours.  ProBNP (last 3 results) No results for input(s): PROBNP in the last 8760 hours.    Studies: Dg Chest 2 View  Result Date: 05/09/2018 CLINICAL DATA:  Preop hip  surgery EXAM: CHEST - 2 VIEW COMPARISON:  11/14/2015 FINDINGS: Posterior lung opacity. No pleural effusion. Borderline cardiomegaly with aortic atherosclerosis. No pneumothorax. IMPRESSION: Posterior lung opacity, may reflect dependent atelectasis although infiltrate difficult to exclude. Electronically Signed   By: Donavan Foil M.D.   On: 05/09/2018 19:41   Dg Knee Complete 4 Views Right  Result Date: 05/09/2018 CLINICAL DATA:  Initial evaluation for acute trauma, fall. EXAM: RIGHT KNEE - COMPLETE 4+ VIEW COMPARISON:  None. FINDINGS: No acute fracture or dislocation. No joint effusion. Moderate tricompartmental degenerative osteoarthrosis. Associated chondrocalcinosis. No acute soft tissue abnormality. Bones mildly osteopenic. IMPRESSION: 1. No acute osseous abnormality about the knee. 2. Moderate tricompartmental degenerative osteoarthrosis. Electronically Signed   By: Jeannine Boga M.D.   On: 05/09/2018 16:46   Dg Hip Unilat  With Pelvis 2-3 Views Right  Result Date: 05/09/2018 CLINICAL DATA:  Fall.  Right hip and knee pain. EXAM: DG HIP (WITH OR WITHOUT PELVIS) 2-3V RIGHT COMPARISON:  Right femur 05/09/2018 FINDINGS: Subcapital fracture involving the proximal right femur. Fracture is displaced with superior displacement of the right femoral trochanters. There may be a small bone fragment near the superolateral aspect of the right femoral head. Right femoral head appears to be located. Pelvic bony ring is intact. Sclerosis at the symphysis pubis. Joint space narrowing involving the superior left hip joint. Curvature and degenerative changes in the lower lumbar spine. IMPRESSION: Right femoral subcapital fracture. Electronically Signed   By: Markus Daft M.D.   On: 05/09/2018 16:47   Dg Femur 1v Right  Result Date: 05/09/2018 CLINICAL DATA:  Initial evaluation for acute trauma, fall. EXAM: RIGHT FEMUR 1 VIEW COMPARISON:  None. FINDINGS: Acute fracture extending through the subcapital right  femoral neck partially visualized. Associated mild superior subluxation. Finding better evaluated on concomitant radiograph of the right hip. Subtrochanteric femur otherwise intact. No other acute osseous abnormality. Moderate to advanced degenerative osteoarthrosis noted about the knee. IMPRESSION: 1. Acute fracture involving the subcapital right femoral neck with mild superior subluxation, better evaluated on concomitant radiograph of the right hip. 2. No other acute osseous abnormality about the femur. Electronically Signed   By: Jeannine Boga M.D.   On: 05/09/2018 16:44    Scheduled Meds: . [MAR Hold] chlorhexidine  60 mL Topical Once  . [MAR Hold] donepezil  5 mg Oral QHS  . [MAR Hold] povidone-iodine  2 application Topical Once    Admission status: Inpatient: Based on patients clinical presentation and evaluation of above clinical data, I have made determination that patient meets Inpatient criteria at this time.*Patient with hip fracture, admitted for surgery.  IV pain medications.  Time  spent: 25 min  [facility] Pager [phone]. If 7PM-7AM, please contact night-coverage at [email or web address], Office  [phone]  password [email or web address]  05/10/2018, 1:43 PM  LOS: 1 day

## 2018-05-10 NOTE — Anesthesia Preprocedure Evaluation (Signed)
Anesthesia Evaluation  Patient identified by MRN, date of birth, ID band Patient awake    Reviewed: Allergy & Precautions, NPO status , Patient's Chart, lab work & pertinent test results  Airway Mallampati: II  TM Distance: >3 FB Neck ROM: Full    Dental  (+) Dental Advisory Given   Pulmonary neg pulmonary ROS,    Pulmonary exam normal breath sounds clear to auscultation       Cardiovascular hypertension, Normal cardiovascular exam Rhythm:Regular Rate:Normal     Neuro/Psych PSYCHIATRIC DISORDERS TIA   GI/Hepatic negative GI ROS, Neg liver ROS,   Endo/Other  negative endocrine ROS  Renal/GU negative Renal ROS     Musculoskeletal negative musculoskeletal ROS (+)   Abdominal   Peds  Hematology negative hematology ROS (+)   Anesthesia Other Findings   Reproductive/Obstetrics                             Anesthesia Physical Anesthesia Plan  ASA: III  Anesthesia Plan: Spinal   Post-op Pain Management:    Induction: Intravenous  PONV Risk Score and Plan:   Airway Management Planned:   Additional Equipment:   Intra-op Plan:   Post-operative Plan:   Informed Consent: I have reviewed the patients History and Physical, chart, labs and discussed the procedure including the risks, benefits and alternatives for the proposed anesthesia with the patient or authorized representative who has indicated his/her understanding and acceptance.   Dental advisory given  Plan Discussed with: CRNA  Anesthesia Plan Comments:         Anesthesia Quick Evaluation

## 2018-05-10 NOTE — Anesthesia Procedure Notes (Signed)
Spinal  Patient location during procedure: OR Start time: 05/10/2018 11:57 AM End time: 05/10/2018 12:05 PM Staffing Anesthesiologist: Myrtie Soman, MD Performed: anesthesiologist  Preanesthetic Checklist Completed: patient identified, site marked, surgical consent, pre-op evaluation, timeout performed, IV checked, risks and benefits discussed and monitors and equipment checked Spinal Block Patient position: right lateral decubitus Prep: ChloraPrep Patient monitoring: heart rate, continuous pulse ox and blood pressure Approach: midline Location: L3-4 Injection technique: single-shot Needle Needle type: Spinocan  Needle gauge: 22 G Needle length: 9 cm Additional Notes Expiration date of kit checked and confirmed. Patient tolerated procedure well, without complications.

## 2018-05-10 NOTE — Interval H&P Note (Signed)
History and Physical Interval Note:  05/10/2018 11:45 AM  Nichole [name]  has presented today for surgery, with the diagnosis of right hip fracture  The various methods of treatment have been discussed with the patient and family. After consideration of risks, benefits and other options for treatment, the patient has consented to  Procedure(s): ANTERIOR APPROACH HEMI HIP ARTHROPLASTY (Right) as a surgical intervention .  The patient's history has been reviewed, patient examined, no change in status, stable for surgery.  I have reviewed the patient's chart and labs.  Questions were answered to the patient's satisfaction.    The risks, benefits, and alternatives were discussed with the patient. There are risks associated with the surgery including, but not limited to, problems with anesthesia (death), infection, instability (giving out of the joint), dislocation, differences in leg length/angulation/rotation, fracture of bones, loosening or failure of implants, hematoma (blood accumulation) which may require surgical drainage, blood clots, pulmonary embolism, nerve injury (foot drop and lateral thigh numbness), and blood vessel injury. The patient understands these risks and elects to proceed.    Hilton Cork Bekka Qian

## 2018-05-10 NOTE — Progress Notes (Signed)
Received report from Nichole [name] in PACU

## 2018-05-10 NOTE — Plan of Care (Signed)
Pt is stable. Unable to review plan of care with pt at this time.. Pt is forgetfully, confused at times.

## 2018-05-10 NOTE — Transfer of Care (Signed)
Immediate Anesthesia Transfer of Care Note  Patient: Nichole [name]  Procedure(s) Performed: Procedure(s): ANTERIOR APPROACH HEMI HIP ARTHROPLASTY (Right)  Patient Location: PACU  Anesthesia Type:Spinal  Level of Consciousness:  sedated, patient cooperative and responds to stimulation  Airway & Oxygen Therapy:Patient Spontanous Breathing and Patient connected to face mask oxgen  Post-op Assessment:  Report given to PACU RN and Post -op Vital signs reviewed and stable  Post vital signs:  Reviewed and stable  Last Vitals:  Vitals:   05/10/18 0931 05/10/18 1436  BP: (!) 155/62   Pulse: 87 68  Resp: 14 12  Temp: 36.8 C 36.9 C  SpO2: 40% 375%    Complications: No apparent anesthesia complications

## 2018-05-11 ENCOUNTER — Encounter (HOSPITAL_COMMUNITY): Payer: Self-pay | Admitting: Orthopedic Surgery

## 2018-05-11 DIAGNOSIS — F4325 Adjustment disorder with mixed disturbance of emotions and conduct: Secondary | ICD-10-CM

## 2018-05-11 LAB — TSH: TSH: 1.623 u[IU]/mL (ref 0.350–4.500)

## 2018-05-11 LAB — CBC
HCT: 33 % — ABNORMAL LOW (ref 36.0–46.0)
HEMOGLOBIN: 10.5 g/dL — AB (ref 12.0–15.0)
MCH: 31.1 pg (ref 26.0–34.0)
MCHC: 31.8 g/dL (ref 30.0–36.0)
MCV: 97.6 fL (ref 80.0–100.0)
PLATELETS: 147 10*3/uL — AB (ref 150–400)
RBC: 3.38 MIL/uL — ABNORMAL LOW (ref 3.87–5.11)
RDW: 13.2 % (ref 11.5–15.5)
WBC: 9.5 10*3/uL (ref 4.0–10.5)
nRBC: 0 % (ref 0.0–0.2)

## 2018-05-11 LAB — BASIC METABOLIC PANEL
Anion gap: 7 (ref 5–15)
BUN: 15 mg/dL (ref 8–23)
CHLORIDE: 102 mmol/L (ref 98–111)
CO2: 25 mmol/L (ref 22–32)
CREATININE: 0.93 mg/dL (ref 0.44–1.00)
Calcium: 8.5 mg/dL — ABNORMAL LOW (ref 8.9–10.3)
GFR calc Af Amer: 60 mL/min — ABNORMAL LOW (ref >60)
GFR calc non Af Amer: 51 mL/min — ABNORMAL LOW (ref >60)
Glucose, Bld: 132 mg/dL — ABNORMAL HIGH (ref 70–99)
Potassium: 4.4 mmol/L (ref 3.5–5.1)
SODIUM: 134 mmol/L — AB (ref 135–145)

## 2018-05-11 LAB — VITAMIN B12: Vitamin B-12: 102 pg/mL — ABNORMAL LOW (ref 180–914)

## 2018-05-11 MED ORDER — CYANOCOBALAMIN 1000 MCG/ML IJ SOLN
1000.0000 ug | Freq: Once | INTRAMUSCULAR | Status: AC
  Administered 2018-05-11: 1000 ug via INTRAMUSCULAR
  Filled 2018-05-11: qty 1

## 2018-05-11 MED ORDER — POLYETHYLENE GLYCOL 3350 17 G PO PACK
17.0000 g | PACK | Freq: Every day | ORAL | Status: DC
  Administered 2018-05-11 – 2018-05-12 (×2): 17 g via ORAL
  Filled 2018-05-11 (×2): qty 1

## 2018-05-11 MED ORDER — ENSURE ENLIVE PO LIQD
237.0000 mL | Freq: Two times a day (BID) | ORAL | Status: DC
  Administered 2018-05-11 – 2018-05-12 (×3): 237 mL via ORAL

## 2018-05-11 MED ORDER — ELDERTONIC PO LIQD
15.0000 mL | Freq: Every day | ORAL | Status: DC
  Administered 2018-05-11 – 2018-05-12 (×2): 15 mL via ORAL
  Filled 2018-05-11 (×2): qty 15

## 2018-05-11 MED ORDER — TRAMADOL HCL 50 MG PO TABS: 50.0000 mg | ORAL_TABLET | Freq: Four times a day (QID) | ORAL | 0 refills | Status: DC | PRN

## 2018-05-11 MED ORDER — ASPIRIN 81 MG PO TABS: 81.0000 mg | ORAL_TABLET | Freq: Two times a day (BID) | ORAL | 1 refills | Status: DC

## 2018-05-11 NOTE — Progress Notes (Addendum)
PROGRESS NOTE    Nichole [name]  CBS:[id number] DOB: 12/28/1922 DOA: 05/09/2018 PCP: Aretta Nip, MD   Brief Narrative: 84 year old female with history of memory problems was brought to hospital after she had a fall at home.  X-ray of the hip showed right femoral subcapital fracture.   Assessment & Plan:   Active Problems:   Hip fracture (HCC)   Closed displaced fracture of right femoral neck (HCC)   Hip fracture; right  S/P sx 11-05: Right hip hemiarthroplasty Bowel regimen.  DVT prophylaxis per ortho  Dementia; continue with Aricept.  Daughter report worsening memory.  B 12 ordered. And was low. Start supplement.   B 12 deficiency; will order IM injection today.   Depression;  Per daughter patient has been saying she will starve to death, if she could open the window she would jump.  Psych consulted.   Mild hyponatremia;  IV fluids.   Acute blood loss anemia; post sx expected. Repeat hb in am.      Nutrition Interventions:     Estimated body mass index is 22.47 kg/m as calculated from the following:   Height as of this encounter: 5\' 5"  (1.651 m).   Weight as of this encounter: 61.2 kg.   DVT prophylaxis: lovenox Code Status: DNR Family Communication: daughter at bedside.  Disposition Plan: SNF when stable.  Consultants:  Ortho Psych   Procedures:   Hip sx  Antimicrobials: none  Subjective: She is alert, mild hip pain.  Per daughter patient has been saying she will starve to death, if she could open the window she would jump.  Also daughter report that patient has had worsening memory.   Objective: Vitals:   05/10/18 2203 05/11/18 0116 05/11/18 0609 05/11/18 0951  BP: (!) 142/71 (!) 170/61 (!) 163/88 (!) 133/48  Pulse: 68 66 67 73  Resp: 15 17 15 14   Temp: 98.1 F (36.7 C) 98 F (36.7 C) (!) 97.5 F (36.4 C) 97.8 F (36.6 C)  TempSrc: Oral Oral Oral   SpO2: 99% 99% 100% 97%  Weight:      Height:        Intake/Output  Summary (Last 24 hours) at 05/11/2018 1009 Last data filed at 05/11/2018 0952 Gross per 24 hour  Intake 3813.97 ml  Output 1575 ml  Net 2238.97 ml   Filed Weights   05/09/18 1540  Weight: 61.2 kg    Examination:  General exam: Appears calm and comfortable  Respiratory system: Clear to auscultation. Respiratory effort normal. Cardiovascular system: S1 & S2 heard, RRR. No JVD, murmurs, rubs, gallops or clicks. No pedal edema. Gastrointestinal system: Abdomen is nondistended, soft and nontender. No organomegaly or masses felt. Normal bowel sounds heard. Central nervous system: Alert and oriented. No focal neurological deficits. Extremities: right hip with dressing.  Skin: No rashes, lesions or ulcers    Data Reviewed: I have personally reviewed following labs and imaging studies  CBC: Recent Labs  Lab 05/09/18 1743 05/09/18 1754 05/10/18 1606 05/11/18 0525  WBC 9.3  --  11.1* 9.5  NEUTROABS 8.0*  --   --   --   HGB 12.5 12.9 12.8 10.5*  HCT 39.6 38.0 40.2 33.0*  MCV 99.2  --  98.8 97.6  PLT 223  --  192 846*   Basic Metabolic Panel: Recent Labs  Lab 05/09/18 1743 05/09/18 1754 05/10/18 1606 05/11/18 0525  NA 137 136  --  134*  K 4.4 4.8  --  4.4  CL 100 101  --  102  CO2 28  --   --  25  GLUCOSE 126* 124*  --  132*  BUN 21 26*  --  15  CREATININE 1.11* 1.10* 1.01* 0.93  CALCIUM 9.6  --   --  8.5*   GFR: Estimated Creatinine Clearance: 34 mL/min (by C-G formula based on SCr of 0.93 mg/dL). Liver Function Tests: Recent Labs  Lab 05/09/18 1743  AST 39  ALT 26  ALKPHOS 77  BILITOT 0.8  PROT 8.0  ALBUMIN 4.2   No results for input(s): LIPASE, AMYLASE in the last 168 hours. No results for input(s): AMMONIA in the last 168 hours. Coagulation Profile: No results for input(s): INR, PROTIME in the last 168 hours. Cardiac Enzymes: No results for input(s): CKTOTAL, CKMB, CKMBINDEX, TROPONINI in the last 168 hours. BNP (last 3 results) No results for  input(s): PROBNP in the last 8760 hours. HbA1C: No results for input(s): HGBA1C in the last 72 hours. CBG: No results for input(s): GLUCAP in the last 168 hours. Lipid Profile: No results for input(s): CHOL, HDL, LDLCALC, TRIG, CHOLHDL, LDLDIRECT in the last 72 hours. Thyroid Function Tests: No results for input(s): TSH, T4TOTAL, FREET4, T3FREE, THYROIDAB in the last 72 hours. Anemia Panel: No results for input(s): VITAMINB12, FOLATE, FERRITIN, TIBC, IRON, RETICCTPCT in the last 72 hours. Sepsis Labs: No results for input(s): PROCALCITON, LATICACIDVEN in the last 168 hours.  Recent Results (from the past 240 hour(s))  Surgical PCR screen     Status: None   Collection Time: 05/09/18 10:00 PM  Result Value Ref Range Status   MRSA, PCR NEGATIVE NEGATIVE Final   Staphylococcus aureus NEGATIVE NEGATIVE Final    Comment: (NOTE) The Xpert SA Assay (FDA approved for NASAL specimens in patients 58 years of age and older), is one component of a comprehensive surveillance program. It is not intended to diagnose infection nor to guide or monitor treatment. Performed at [facility], [place] [address], [place], [place] [address]          Radiology Studies: Dg Chest 2 View  Result Date: 05/09/2018 CLINICAL DATA:  Preop hip surgery EXAM: CHEST - 2 VIEW COMPARISON:  11/14/2015 FINDINGS: Posterior lung opacity. No pleural effusion. Borderline cardiomegaly with aortic atherosclerosis. No pneumothorax. IMPRESSION: Posterior lung opacity, may reflect dependent atelectasis although infiltrate difficult to exclude. Electronically Signed   By: Donavan Foil M.D.   On: 05/09/2018 19:41   Pelvis Portable  Result Date: 05/10/2018 CLINICAL DATA:  84 year old female with right femoral neck fracture. Subsequent encounter. EXAM: PORTABLE PELVIS 1-2 VIEWS COMPARISON:  05/09/2018. FINDINGS: Post total right hip replacement which appears in satisfactory position without complication noted on  frontal projection. Foley catheter in place.  Left hip joint degenerative changes. IMPRESSION: Post total right hip replacement. Electronically Signed   By: Genia Del M.D.   On: 05/10/2018 14:51   Dg Knee Complete 4 Views Right  Result Date: 05/09/2018 CLINICAL DATA:  Initial evaluation for acute trauma, fall. EXAM: RIGHT KNEE - COMPLETE 4+ VIEW COMPARISON:  None. FINDINGS: No acute fracture or dislocation. No joint effusion. Moderate tricompartmental degenerative osteoarthrosis. Associated chondrocalcinosis. No acute soft tissue abnormality. Bones mildly osteopenic. IMPRESSION: 1. No acute osseous abnormality about the knee. 2. Moderate tricompartmental degenerative osteoarthrosis. Electronically Signed   By: Jeannine Boga M.D.   On: 05/09/2018 16:46   Dg C-arm 1-60 Min-no Report  Result Date: 05/10/2018 Fluoroscopy was utilized by the requesting physician.  No radiographic interpretation.   Dg Hip Operative Unilat  With Pelvis Right  Result Date: 05/10/2018 CLINICAL DATA:  RIGHT hip hemiarthroplasty EXAM: OPERATIVE RIGHT HIP (WITH PELVIS IF PERFORMED) 2 VIEWS TECHNIQUE: Fluoroscopic spot image(s) were submitted for interpretation post-operatively. COMPARISON:  05/09/2018 radiographs FINDINGS: RIGHT hip hemiarthroplasty noted without complicating features. No dislocation on this single view. IMPRESSION: RIGHT hip hemiarthroplasty without complicating features. Electronically Signed   By: Margarette Canada M.D.   On: 05/10/2018 13:48   Dg Hip Unilat  With Pelvis 2-3 Views Right  Result Date: 05/09/2018 CLINICAL DATA:  Fall.  Right hip and knee pain. EXAM: DG HIP (WITH OR WITHOUT PELVIS) 2-3V RIGHT COMPARISON:  Right femur 05/09/2018 FINDINGS: Subcapital fracture involving the proximal right femur. Fracture is displaced with superior displacement of the right femoral trochanters. There may be a small bone fragment near the superolateral aspect of the right femoral head. Right femoral head appears  to be located. Pelvic bony ring is intact. Sclerosis at the symphysis pubis. Joint space narrowing involving the superior left hip joint. Curvature and degenerative changes in the lower lumbar spine. IMPRESSION: Right femoral subcapital fracture. Electronically Signed   By: Markus Daft M.D.   On: 05/09/2018 16:47   Dg Femur 1v Right  Result Date: 05/09/2018 CLINICAL DATA:  Initial evaluation for acute trauma, fall. EXAM: RIGHT FEMUR 1 VIEW COMPARISON:  None. FINDINGS: Acute fracture extending through the subcapital right femoral neck partially visualized. Associated mild superior subluxation. Finding better evaluated on concomitant radiograph of the right hip. Subtrochanteric femur otherwise intact. No other acute osseous abnormality. Moderate to advanced degenerative osteoarthrosis noted about the knee. IMPRESSION: 1. Acute fracture involving the subcapital right femoral neck with mild superior subluxation, better evaluated on concomitant radiograph of the right hip. 2. No other acute osseous abnormality about the femur. Electronically Signed   By: Jeannine Boga M.D.   On: 05/09/2018 16:44        Scheduled Meds: . chlorhexidine  60 mL Topical Once  . docusate sodium  100 mg Oral BID  . donepezil  5 mg Oral QHS  . enoxaparin (LOVENOX) injection  30 mg Subcutaneous Q24H  . povidone-iodine  2 application Topical Once  . senna  1 tablet Oral BID   Continuous Infusions: . sodium chloride 75 mL/hr at 05/11/18 0741     LOS: 2 days    Time spent: 35 minutes     Elmarie Shiley, MD [facility] Pager [phone]  If 7PM-7AM, please contact night-coverage [email or web address] Password [email or web address] 05/11/2018, 10:09 AM

## 2018-05-11 NOTE — Consult Note (Signed)
[facility] Face-to-Face Psychiatry Consult   Reason for Consult:  Depression and SI Referring Physician:  Dr. Regalado  Patient Identification: Nichole [name] MRN:  [id number] Principal Diagnosis: Adjustment disorder with mixed disturbance of emotions and conduct Diagnosis:   Patient Active Problem List   Diagnosis Date Noted  . Closed displaced fracture of right femoral neck (HCC) [S72.001A] 05/10/2018  . Hip fracture (HCC) [S72.009A] 05/09/2018  . Alzheimer disease (HCC) [G30.9, F02.80] 08/25/2017  . SIADH (syndrome of inappropriate ADH production) (HCC) [E22.2]   . HTN (hypertension) [I10] 11/16/2015  . Hyponatremia [E87.1] 11/13/2015    Total Time spent with patient: 30 minutes  Subjective:   Nichole [name] is a 84 y.o. female patient admitted with right hip fracture.  HPI:  Per chart review, patient was admitted with right hip fracture POD 1 from right hip hemiarthroplasty. She has a history of Alzheimer's Disease. She has limited short term memory per PT note. She is recommended for a SNF. Psychiatry was consulted for depression with SI. Home medications include Donepezil 5 mg qhs.   On interview, Nichole [name] was found lying in bed while reading a book. She denies mood symptoms. She denies a history of anxiety or depression. She denies SI, HI or AVH. She denies problems with sleep or appetite. She is only oriented to person. She believes the month is November and the year is 1926. She believes that she is at her independent living facility.   Past Psychiatric History: Denies   Risk to Self:  None. Denies SI.  Risk to Others:  None. Denies HI. Prior Inpatient Therapy:  Denies  Prior Outpatient Therapy:  Denies   Past Medical History:  Past Medical History:  Diagnosis Date  . Alzheimer disease (HCC) 08/25/2017  . Breast CA (HCC)   . TIA (transient ischemic attack) 2009    Past Surgical History:  Procedure Laterality Date  . ANTERIOR APPROACH HEMI HIP ARTHROPLASTY Right  05/10/2018   Procedure: ANTERIOR APPROACH HEMI HIP ARTHROPLASTY;  Surgeon: Swinteck, Brian, MD;  Location: [facility];  Service: Orthopedics;  Laterality: Right;  . BREAST SURGERY    . MASTECTOMY Left 1995   Family History:  Family History  Problem Relation Age of Onset  . Ovarian cancer Sister   . Breast cancer Sister   . Colon cancer Mother   . AAA (abdominal aortic aneurysm) Mother   . Coronary artery disease Father    Family Psychiatric  History: Denies  Social History:  Social History   Substance and Sexual Activity  Alcohol Use No     Social History   Substance and Sexual Activity  Drug Use No    Social History   Socioeconomic History  . Marital status: Widowed    Spouse name: Not on file  . Number of children: 2  . Years of education: 12  . Highest education level: Not on file  Occupational History  . Occupation: Retired  Social Needs  . Financial resource strain: Not on file  . Food insecurity:    Worry: Not on file    Inability: Not on file  . Transportation needs:    Medical: Not on file    Non-medical: Not on file  Tobacco Use  . Smoking status: Never Smoker  . Smokeless tobacco: Never Used  Substance and Sexual Activity  . Alcohol use: No  . Drug use: No  . Sexual activity: Never    Birth control/protection: None  Lifestyle  . Physical activity:    Days per week:   Not on file    Minutes per session: Not on file  . Stress: Not on file  Relationships  . Social connections:    Talks on phone: Not on file    Gets together: Not on file    Attends religious service: Not on file    Active member of club or organization: Not on file    Attends meetings of clubs or organizations: Not on file    Relationship status: Not on file  Other Topics Concern  . Not on file  Social History Narrative   Granddaughter live with her   Caffeine use: 1 serving per day, 1 coke per day   Right handed    Additional Social History: She previously lived at [facility] in independent living. She has an adult son and daughter. She denies alcohol or illicit substance use.     Allergies:  No Known Allergies  Labs:  Results for orders placed or performed during the hospital encounter of 05/09/18 (from the past 48 hour(s))  CBC with Differential/Platelet     Status: Abnormal   Collection Time: 05/09/18  5:43 PM  Result Value Ref Range   WBC 9.3 4.0 - 10.5 K/uL   RBC 3.99 3.87 - 5.11 MIL/uL   Hemoglobin 12.5 12.0 - 15.0 g/dL   HCT 39.6 36.0 - 46.0 %   MCV 99.2 80.0 - 100.0 fL   MCH 31.3 26.0 - 34.0 pg   MCHC 31.6 30.0 - 36.0 g/dL   RDW 13.3 11.5 - 15.5 %   Platelets 223 150 - 400 K/uL   nRBC 0.0 0.0 - 0.2 %   Neutrophils Relative % 87 %   Neutro Abs 8.0 (H) 1.7 - 7.7 K/uL   Lymphocytes Relative 8 %   Lymphs Abs 0.7 0.7 - 4.0 K/uL   Monocytes Relative 5 %   Monocytes Absolute 0.5 0.1 - 1.0 K/uL   Eosinophils Relative 0 %   Eosinophils Absolute 0.0 0.0 - 0.5 K/uL   Basophils Relative 0 %   Basophils Absolute 0.0 0.0 - 0.1 K/uL   Immature Granulocytes 0 %   Abs Immature Granulocytes 0.04 0.00 - 0.07 K/uL    Comment: Performed at [facility], [address] [place], [place] [address]  Comprehensive metabolic panel     Status: Abnormal   Collection Time: 05/09/18  5:43 PM  Result Value Ref Range   Sodium 137 135 - 145 mmol/L   Potassium 4.4 3.5 - 5.1 mmol/L   Chloride 100 98 - 111 mmol/L   CO2 28 22 - 32 mmol/L   Glucose, Bld 126 (H) 70 - 99 mg/dL   BUN 21 8 - 23 mg/dL   Creatinine, Ser 1.11 (H) 0.44 - 1.00 mg/dL   Calcium 9.6 8.9 - 10.3 mg/dL   Total Protein 8.0 6.5 - 8.1 g/dL   Albumin 4.2 3.5 - 5.0 g/dL   AST 39 15 - 41 U/L   ALT 26 0 - 44 U/L   Alkaline Phosphatase 77 38 - 126 U/L   Total Bilirubin 0.8 0.3 - 1.2 mg/dL   GFR calc non Af Amer 41 (L) >60 mL/min   GFR calc Af Amer 48 (L) >60 mL/min    Comment: (NOTE) The eGFR has been calculated using the CKD EPI equation. This calculation has not been validated  in all clinical situations. eGFR's persistently <60 mL/min signify possible Chronic Kidney Disease.    Anion gap 9 5 - 15    Comment:   Performed at [facility], [place] [address], [place], [place] [address]  Type and screen     Status: None   Collection Time: 05/09/18  5:43 PM  Result Value Ref Range   ABO/RH(D) O POS    Antibody Screen NEG    Sample Expiration      05/12/2018 Performed at [facility], [place] [address], [place], [place] [address]   ABO/Rh     Status: None   Collection Time: 05/09/18  5:43 PM  Result Value Ref Range   ABO/RH(D)      O POS Performed at [facility], [place] [address] [place], [place] [address]   I-stat chem 8, ed     Status: Abnormal   Collection Time: 05/09/18  5:54 PM  Result Value Ref Range   Sodium 136 135 - 145 mmol/L   Potassium 4.8 3.5 - 5.1 mmol/L   Chloride 101 98 - 111 mmol/L   BUN 26 (H) 8 - 23 mg/dL   Creatinine, Ser 1.10 (H) 0.44 - 1.00 mg/dL   Glucose, Bld 124 (H) 70 - 99 mg/dL   Calcium, Ion 1.11 (L) 1.15 - 1.40 mmol/L   TCO2 31 22 - 32 mmol/L   Hemoglobin 12.9 12.0 - 15.0 g/dL   HCT 38.0 36.0 - 46.0 %  Surgical PCR screen     Status: None   Collection Time: 05/09/18 10:00 PM  Result Value Ref Range   MRSA, PCR NEGATIVE NEGATIVE   Staphylococcus aureus NEGATIVE NEGATIVE    Comment: (NOTE) The Xpert SA Assay (FDA approved for NASAL specimens in patients 29 years of age and older), is one component of a comprehensive surveillance program. It is not intended to diagnose infection nor to guide or monitor treatment. Performed at [facility], [place] [address] [place], [place] [address]   CBC     Status: Abnormal   Collection Time: 05/10/18  4:06 PM  Result Value Ref Range   WBC 11.1 (H) 4.0 - 10.5 K/uL   RBC 4.07 3.87 - 5.11 MIL/uL   Hemoglobin 12.8 12.0 - 15.0 g/dL   HCT 40.2 36.0 - 46.0 %   MCV 98.8 80.0 - 100.0 fL   MCH 31.4 26.0 - 34.0 pg   MCHC  31.8 30.0 - 36.0 g/dL   RDW 13.3 11.5 - 15.5 %   Platelets 192 150 - 400 K/uL   nRBC 0.0 0.0 - 0.2 %    Comment: Performed at [facility], [place] [address], [place], [place] [address]  Creatinine, serum     Status: Abnormal   Collection Time: 05/10/18  4:06 PM  Result Value Ref Range   Creatinine, Ser 1.01 (H) 0.44 - 1.00 mg/dL   GFR calc non Af Amer 46 (L) >60 mL/min   GFR calc Af Amer 54 (L) >60 mL/min    Comment: (NOTE) The eGFR has been calculated using the CKD EPI equation. This calculation has not been validated in all clinical situations. eGFR's persistently <60 mL/min signify possible Chronic Kidney Disease. Performed at [facility] [place] [address] [place], [place] [address]   CBC     Status: Abnormal   Collection Time: 05/11/18  5:25 AM  Result Value Ref Range   WBC 9.5 4.0 - 10.5 K/uL   RBC 3.38 (L) 3.87 - 5.11 MIL/uL   Hemoglobin 10.5 (L) 12.0 - 15.0 g/dL   HCT 33.0 (L) 36.0 - 46.0 %   MCV 97.6  80.0 - 100.0 fL   MCH 31.1 26.0 - 34.0 pg   MCHC 31.8 30.0 - 36.0 g/dL   RDW 13.2 11.5 - 15.5 %   Platelets 147 (L) 150 - 400 K/uL   nRBC 0.0 0.0 - 0.2 %    Comment: Performed at [facility], [place] [address], [place], [place] [address]  Basic metabolic panel     Status: Abnormal   Collection Time: 05/11/18  5:25 AM  Result Value Ref Range   Sodium 134 (L) 135 - 145 mmol/L   Potassium 4.4 3.5 - 5.1 mmol/L   Chloride 102 98 - 111 mmol/L   CO2 25 22 - 32 mmol/L   Glucose, Bld 132 (H) 70 - 99 mg/dL   BUN 15 8 - 23 mg/dL   Creatinine, Ser 0.93 0.44 - 1.00 mg/dL   Calcium 8.5 (L) 8.9 - 10.3 mg/dL   GFR calc non Af Amer 51 (L) >60 mL/min   GFR calc Af Amer 60 (L) >60 mL/min    Comment: (NOTE) The eGFR has been calculated using the CKD EPI equation. This calculation has not been validated in all clinical situations. eGFR's persistently <60 mL/min signify possible Chronic Kidney Disease.    Anion gap 7 5 - 15     Comment: Performed at [facility], [place] [address] [place], [place] [address]  TSH     Status: None   Collection Time: 05/11/18 10:39 AM  Result Value Ref Range   TSH 1.623 0.350 - 4.500 uIU/mL    Comment: Performed by a 3rd Generation assay with a functional sensitivity of <=0.01 uIU/mL. Performed at [facility], [place] [address], [place], [place] [address]   Vitamin B12     Status: Abnormal   Collection Time: 05/11/18 10:39 AM  Result Value Ref Range   Vitamin B-12 102 (L) 180 - 914 pg/mL    Comment: (NOTE) This assay is not validated for testing neonatal or myeloproliferative syndrome specimens for Vitamin B12 levels. Performed at [facility], [place] [address], [place], [place] [address]     Current Facility-Administered Medications  Medication Dose Route Frequency Provider Last Rate Last Dose  . 0.9 %  sodium chloride infusion   Intravenous Continuous Rod Can, MD 75 mL/hr at 05/11/18 0741    . acetaminophen (TYLENOL) tablet 325-650 mg  325-650 mg Oral Q6H PRN Swinteck, Aaron Edelman, MD      . chlorhexidine (HIBICLENS) 4 % liquid 4 application  60 mL Topical Once [place], Marge Duncans, MD      . docusate sodium (COLACE) capsule 100 mg  100 mg Oral BID Rod Can, MD   100 mg at 05/11/18 0902  . donepezil (ARICEPT) tablet 5 mg  5 mg Oral QHS Oswald Hillock, MD   5 mg at 05/10/18 2058  . enoxaparin (LOVENOX) injection 30 mg  30 mg Subcutaneous Q24H Rod Can, MD   30 mg at 05/11/18 0902  . feeding supplement (ENSURE ENLIVE) (ENSURE ENLIVE) liquid 237 mL  237 mL Oral BID BM Regalado, Belkys A, MD      . geriatric multivitamins-minerals (ELDERTONIC/GEVRABON) liquid 15 mL  15 mL Oral Daily Regalado, Belkys A, MD      . HYDROcodone-acetaminophen (NORCO) 7.5-325 MG per tablet 1-2 tablet  1-2 tablet Oral Q4H PRN Swinteck, Aaron Edelman, MD      . HYDROcodone-acetaminophen (NORCO/VICODIN) 5-325 MG per tablet 1-2 tablet  1-2 tablet Oral Q6H  PRN Oswald Hillock, MD  2 tablet at 05/11/18 0931  . HYDROcodone-acetaminophen (NORCO/VICODIN) 5-325 MG per tablet 1-2 tablet  1-2 tablet Oral Q4H PRN Swinteck, Aaron Edelman, MD      . menthol-cetylpyridinium (CEPACOL) lozenge 3 mg  1 lozenge Oral PRN Swinteck, Aaron Edelman, MD       Or  . phenol (CHLORASEPTIC) mouth spray 1 spray  1 spray Mouth/Throat PRN Swinteck, Aaron Edelman, MD      . metoCLOPramide (REGLAN) tablet 5-10 mg  5-10 mg Oral Q8H PRN Swinteck, Aaron Edelman, MD       Or  . metoCLOPramide (REGLAN) injection 5-10 mg  5-10 mg Intravenous Q8H PRN Swinteck, Aaron Edelman, MD      . morphine 2 MG/ML injection 0.5 mg  0.5 mg Intravenous Q2H PRN Oswald Hillock, MD      . morphine 2 MG/ML injection 0.5-1 mg  0.5-1 mg Intravenous Q2H PRN Swinteck, Aaron Edelman, MD      . ondansetron (ZOFRAN) tablet 4 mg  4 mg Oral Q6H PRN Swinteck, Aaron Edelman, MD       Or  . ondansetron (ZOFRAN) injection 4 mg  4 mg Intravenous Q6H PRN Swinteck, Aaron Edelman, MD      . povidone-iodine 10 % swab 2 application  2 application Topical Once [place], Gagan S, MD      . senna Donavan Burnet) tablet 8.6 mg  1 tablet Oral BID Rod Can, MD   8.6 mg at 05/11/18 0902    Musculoskeletal: Strength & Muscle Tone: within normal limits Gait & Station: unable to stand Patient leans: N/A  Psychiatric Specialty Exam: Physical Exam  Nursing note and vitals reviewed. Constitutional: She appears well-developed and well-nourished.  HENT:  Head: Normocephalic and atraumatic.  Neck: Normal range of motion.  Respiratory: Effort normal.  Musculoskeletal: Normal range of motion.  Neurological: She is alert.  Only oriented to self.   Psychiatric: She has a normal mood and affect. Her speech is normal and behavior is normal. Judgment and thought content normal. Cognition and memory are normal.    Review of Systems  Constitutional: Negative for chills and fever.  Cardiovascular: Negative for chest pain.  Gastrointestinal: Negative for abdominal pain, constipation, diarrhea,  nausea and vomiting.  Musculoskeletal: Negative for joint pain.  Psychiatric/Behavioral: Negative for depression, hallucinations, substance abuse and suicidal ideas. The patient is not nervous/anxious and does not have insomnia.   All other systems reviewed and are negative.   Blood pressure (!) 133/48, pulse 73, temperature 97.8 F (36.6 C), resp. rate 14, height 5' 5" (1.651 m), weight 61.2 kg, SpO2 97 %.Body mass index is 22.47 kg/m.  General Appearance: Fairly Groomed, elderly, Caucasian female, wearing a hospital gown with corrective lenses and short gray hair who is lying in bed while reading a book. NAD.   Eye Contact:  Good  Speech:  Clear and Coherent and Normal Rate  Volume:  Normal  Mood:  Euthymic  Affect:  Appropriate and Congruent  Thought Process:  Goal Directed, Linear and Descriptions of Associations: Intact  Orientation:  Other:  Only oriented to self.  Thought Content:  Logical  Suicidal Thoughts:  No  Homicidal Thoughts:  No  Memory:  Immediate;   Poor Recent;   Fair Remote;   Fair  Judgement:  Poor  Insight:  Shallow  Psychomotor Activity:  Normal  Concentration:  Concentration: Good and Attention Span: Good  Recall:  Poor  Fund of Knowledge:  Fair  Language:  Good  Akathisia:  No  Handed:  Right  AIMS (if indicated):   N/A  Assets:  Communication Skills Desire for Improvement Housing Social Support  ADL's:  Impaired  Cognition: Impaired with short term memory deficits.   Sleep:   Okay   Assessment:  Nichole [name] is a 84 y.o. female who was admitted with right hip fracture POD 1 from right hip hemiarthroplasty. She denies a history of mood symptoms. She denies SI, HI or AVH. She does not appear to be responding to internal stimuli and does not appear depressed. Her affect is bright and she smiles throughout interview. She does not warrant inpatient psychiatric hospitalization at this time. She has significant cognitive deficits with short term memory  recall and would likely be appropriate for placement in a facility with a memory care unit.   Treatment Plan Summary: -Patient does not have active mood symptoms and denies SI. She does not warrant medication management or inpatient psychiatric hospitalization at this time.  -Psychiatry will sign off on patient at this time. Please consult psychiatry again as needed.  Disposition: No evidence of imminent risk to self or others at present.   Patient does not meet criteria for psychiatric inpatient admission.   J , DO 05/11/2018 2:13 PM 

## 2018-05-11 NOTE — Progress Notes (Signed)
Physical Therapy Treatment Patient Details Name: Nichole [name] MRN: [id number] DOB: [birth year] Today's Date: 05/11/2018    History of Present Illness Pt s/p R hip fx with hemi-arthroplasty repair by anterior approach.  Pt with hx of TIA, Alzheimers and breast CA s/p mastectomy    PT Comments    Pt continues very pleasant and cooperative and mobilizing well but with very limited retention of information or insight into current circumstance.  Pt would benefit from follow up in memory unit.   Follow Up Recommendations  SNF     Equipment Recommendations  Rolling walker with 5" wheels    Recommendations for Other Services       Precautions / Restrictions Precautions Precautions: Fall Restrictions Weight Bearing Restrictions: No Other Position/Activity Restrictions: WBAT    Mobility  Bed Mobility Overal bed mobility: Needs Assistance Bed Mobility: Sit to Supine     Supine to sit: Min guard Sit to supine: Min guard   General bed mobility comments: for safety only  Transfers Overall transfer level: Needs assistance Equipment used: Rolling walker (2 wheeled) Transfers: Sit to/from Stand Sit to Stand: Min assist;Min guard         General transfer comment: cues for use of UEs to self assist  Ambulation/Gait Ambulation/Gait assistance: Min assist;Min guard Gait Distance (Feet): 200 Feet Assistive device: Rolling walker (2 wheeled) Gait Pattern/deviations: Step-to pattern;Step-through pattern;Decreased step length - right;Decreased step length - left;Shuffle;Trunk flexed Gait velocity: decr   General Gait Details: cues for posture, position from RW and initial sequence   Stairs             Wheelchair Mobility    Modified Rankin (Stroke Patients Only)       Balance Overall balance assessment: Needs assistance Sitting-balance support: No upper extremity supported;Feet supported Sitting balance-Leahy Scale: Good     Standing balance support: No  upper extremity supported Standing balance-Leahy Scale: Fair                              Cognition Arousal/Alertness: Awake/alert Behavior During Therapy: WFL for tasks assessed/performed Overall Cognitive Status: History of cognitive impairments - at baseline                                 General Comments: Very limited short-term memory      Exercises Total Joint Exercises Ankle Circles/Pumps: AROM;Both;20 reps;Supine Quad Sets: AROM;Both;10 reps;Supine Heel Slides: Right;20 reps;Supine;AAROM;AROM Hip ABduction/ADduction: AAROM;AROM;Right;15 reps;Supine    General Comments        Pertinent Vitals/Pain Pain Assessment: Faces Pain Score: 2  Faces Pain Scale: Hurts a little bit Pain Location: R hip Pain Descriptors / Indicators: Tightness Pain Intervention(s): Limited activity within patient's tolerance;Monitored during session    Home Living Family/patient expects to be discharged to:: Skilled nursing facility               Additional Comments: Pt is at [facility] living but would benefit from follow up in memory care unit    Prior Function Level of Independence: Independent      Comments: No assistive device for ambulation   PT Goals (current goals can now be found in the care plan section) Acute Rehab PT Goals Patient Stated Goal: Home PT Goal Formulation: Patient unable to participate in goal setting Time For Goal Achievement: 05/18/18 Potential to Achieve Goals: Good Progress towards PT goals: Progressing toward  goals    Frequency    Min 6X/week      PT Plan Current plan remains appropriate    Co-evaluation              AM-PAC PT "6 Clicks" Daily Activity  Outcome Measure  Difficulty turning over in bed (including adjusting bedclothes, sheets and blankets)?: A Lot Difficulty moving from lying on back to sitting on the side of the bed? : A Lot Difficulty sitting down on and standing up from a chair  with arms (e.g., wheelchair, bedside commode, etc,.)?: A Lot Help needed moving to and from a bed to chair (including a wheelchair)?: A Little Help needed walking in hospital room?: A Little Help needed climbing 3-5 steps with a railing? : A Little 6 Click Score: 15    End of Session Equipment Utilized During Treatment: Gait belt Activity Tolerance: Patient tolerated treatment well Patient left: with call bell/phone within reach;in bed;with bed alarm set Nurse Communication: Mobility status PT Visit Diagnosis: Unsteadiness on feet (R26.81);Difficulty in walking, not elsewhere classified (R26.2)     Time: 3846-6599 PT Time Calculation (min) (ACUTE ONLY): 14 min  Charges:  $Gait Training: 8-22 mins $Therapeutic Exercise: 8-22 mins                     [place] Pager [phone] Office [phone] -   [place] 05/11/2018, 1:59 PM

## 2018-05-11 NOTE — Care Management Note (Signed)
Case Management Note  Patient Details  Name: Brieonna Crutcher MRN: [id number] Date of Birth: [birth year]  Subjective/Objective:   Plan for d/c to SNF, discharge planning per CSW. [id number]                 Action/Plan:   Expected Discharge Date:  (unknown)               Expected Discharge Plan:  [place]  In-House Referral:  Clinical Social Work  Discharge planning Services  CM Consult  Post Acute Care Choice:  NA Choice offered to:  Patient, Adult Children  DME Arranged:  N/A DME Agency:  NA  HH Arranged:  NA HH Agency:  NA  Status of Service:  Completed, signed off  If discussed at [place] of Stay Meetings, dates discussed:    Additional Comments:  Guadalupe Maple, RN 05/11/2018, 2:42 PM

## 2018-05-11 NOTE — Progress Notes (Signed)
Initial Nutrition Assessment  DOCUMENTATION CODES:   Not applicable  INTERVENTION:   - Add Ensure Enlive BID (each provides 350 kcal and 20 g protein) - Add MVI with minerals given low Hbg   NUTRITION DIAGNOSIS:   Increased nutrient needs related to hip fracture as evidenced by estimated needs.   GOAL:   Patient will meet greater than or equal to 90% of their needs   MONITOR:   PO intake, Supplement acceptance, Labs, I & O's  REASON FOR ASSESSMENT:   Consult Hip fracture protocol  ASSESSMENT:   84 yo female, admitted with hip fx. PMH significant for dementia, breast cancer, TIA.  Labs: sodium 134, glucose 132, GFR 51%/60%, Hgb 10.5, Hct 33.0 Meds: Colace 100 mg BID, senokot 8.6 mg BID, NaCl infusion 75 mL/hr; Reglan ordered  Pt pleasant and lively, sitting in chair at time of visit. States her appetite has been poor for quite a while, but she makes sure to eat at least 2 meals daily with a snack at lunch time. Does not follow any special diet and does not take MVI at home.  UBW 135# (61.2 kg) and stable per chart and per pt report.   Pt denies nausea or vomiting, diarrhea or constipation, or difficulty chewing or swallowing.   Encouraged pt to eat protein-rich foods with every meal, and to eat them first if she is not feeling very hungry. Pt amenable to trying Ensure Enlive BID in chocolate.   NUTRITION - FOCUSED PHYSICAL EXAM:    Most Recent Value  Orbital Region  No depletion  Upper Arm Region  No depletion  Thoracic and Lumbar Region  No depletion  Buccal Region  Mild depletion  Temple Region  No depletion  Clavicle Bone Region  No depletion  Clavicle and Acromion Bone Region  No depletion  Scapular Bone Region  No depletion  Dorsal Hand  Mild depletion  Patellar Region  Mild depletion  Anterior Thigh Region  Mild depletion  Posterior Calf Region  Mild depletion  Edema (RD Assessment)  None  Hair  Reviewed  Eyes  Reviewed  Mouth  Reviewed  Skin   Reviewed  Nails  Reviewed      Diet Order:  75% of 1 meal documented, per nsg Diet Order            Diet regular Room service appropriate? Yes; Fluid consistency: Thin  Diet effective now              EDUCATION NEEDS:  No education needs have been identified at this time  Skin:   Skin Assessment: Reviewed RN Assessment  Last BM:  PTA  Height: Ht Readings from Last 1 Encounters:  05/09/18 5\' 5"  (1.651 m)    Weight:  Wt Readings from Last 1 Encounters:  05/09/18 61.2 kg    Ideal Body Weight:  56.2 kg  BMI:  Body mass index is 22.47 kg/m.  Estimated Nutritional Needs:   Kcal:  1333 calories daily (MSJ x 1.3)  Protein:  67-85 g protein daily (1.2-1.5 g/kg IBW)  Fluid:  >/= 1.3L daily or per MD discretion  Althea Grimmer, MS, RDN, LDN On-call pager: [phone]

## 2018-05-11 NOTE — Progress Notes (Signed)
    Subjective:  Patient reports pain as mild to moderate.  Denies N/V/CP/SOB. Confused.  Objective:   VITALS:   Vitals:   05/10/18 2203 05/11/18 0116 05/11/18 0609 05/11/18 0951  BP: (!) 142/71 (!) 170/61 (!) 163/88 (!) 133/48  Pulse: 68 66 67 73  Resp: 15 17 15 14   Temp: 98.1 F (36.7 C) 98 F (36.7 C) (!) 97.5 F (36.4 C) 97.8 F (36.6 C)  TempSrc: Oral Oral Oral   SpO2: 99% 99% 100% 97%  Weight:      Height:        NAD ABD soft Sensation intact distally Intact pulses distally Dorsiflexion/Plantar flexion intact Incision: dressing C/D/I Compartment soft   Lab Results  Component Value Date   WBC 9.5 05/11/2018   HGB 10.5 (L) 05/11/2018   HCT 33.0 (L) 05/11/2018   MCV 97.6 05/11/2018   PLT 147 (L) 05/11/2018   BMET    Component Value Date/Time   NA 134 (L) 05/11/2018 0525   K 4.4 05/11/2018 0525   CL 102 05/11/2018 0525   CO2 25 05/11/2018 0525   GLUCOSE 132 (H) 05/11/2018 0525   BUN 15 05/11/2018 0525   CREATININE 0.93 05/11/2018 0525   CALCIUM 8.5 (L) 05/11/2018 0525   GFRNONAA 51 (L) 05/11/2018 0525   GFRAA 60 (L) 05/11/2018 0525     Assessment/Plan: 1 Day Post-Op   Active Problems:   Hip fracture (HCC)   Closed displaced fracture of right femoral neck (HCC)   WBAT with walker DVT ppx: Lovenox in house --> home on ASA 81 mg PO BID, SCDs, TEDS PO pain control PT/OT Dispo: D/C planning   Hilton Cork Marialice Newkirk 05/11/2018, 10:37 AM   Rod Can, MD Cell: [phone] [place] is now [facility]  [place] [address], [place], [place] [address] Phone: [phone] [email or web address] Facebook  [employer]

## 2018-05-11 NOTE — Progress Notes (Signed)
OT Cancellation Note  Patient Details Name: Nichole [name] MRN: [id number] DOB: Jan 13, 1923   Cancelled Treatment:    Reason Eval/Treat Not Completed: Other (comment). Family plans memory care facility upon d/c. Will defer OT to that venue, if needed.  Alfie Rideaux 05/11/2018, 11:08 AM  Lesle Chris, OTR/L Acute Rehabilitation Services [phone] [facility] pager [phone] office 05/11/2018

## 2018-05-11 NOTE — Anesthesia Postprocedure Evaluation (Signed)
Anesthesia Post Note  Patient: Nichole [name]  Procedure(s) Performed: ANTERIOR APPROACH HEMI HIP ARTHROPLASTY (Right Hip)     Patient location during evaluation: PACU Anesthesia Type: Spinal Level of consciousness: oriented and awake and alert Pain management: pain level controlled Vital Signs Assessment: post-procedure vital signs reviewed and stable Respiratory status: spontaneous breathing, respiratory function stable and patient connected to nasal cannula oxygen Cardiovascular status: blood pressure returned to baseline and stable Postop Assessment: no headache, no backache and no apparent nausea or vomiting Anesthetic complications: no    Last Vitals:  Vitals:   05/11/18 0951 05/11/18 1421  BP: (!) 133/48 (!) 131/46  Pulse: 73 64  Resp: 14 14  Temp: 36.6 C 36.7 C  SpO2: 97% 99%    Last Pain:  Vitals:   05/11/18 1421  TempSrc: Oral  PainSc:                  [name] S

## 2018-05-11 NOTE — Progress Notes (Signed)
Patient was seen by psychiatrist, no longer need 1 to 1 observation. Order is discontinued. Patient did not verbalize any negative thoughts.  Will continue to monitor.

## 2018-05-11 NOTE — Plan of Care (Signed)

## 2018-05-11 NOTE — Plan of Care (Signed)

## 2018-05-11 NOTE — Evaluation (Signed)
Physical Therapy Evaluation Patient Details Name: Nichole [name] MRN: [id number] DOB: 1923/06/16 Today's Date: 05/11/2018   History of Present Illness  Pt s/p R hip fx with hemi-arthroplasty repair by anterior approach.  Pt with hx of TIA, Alzheimers and breast CA s/p mastectomy  Clinical Impression  Pt admitted as above and presenting with functional mobility limitations 2* decreased R LE strength/ROM. Post op pain and very limited short-term memory.  In discussion with daughter, pt would benefit from follow up in memory care unit at prior IND living facility - [facility].    Follow Up Recommendations SNF    Equipment Recommendations  Rolling walker with 5" wheels    Recommendations for Other Services       Precautions / Restrictions Precautions Precautions: Fall Restrictions Weight Bearing Restrictions: No Other Position/Activity Restrictions: WBAT      Mobility  Bed Mobility Overal bed mobility: Needs Assistance Bed Mobility: Supine to Sit     Supine to sit: Min guard     General bed mobility comments: for safety only  Transfers Overall transfer level: Needs assistance Equipment used: Rolling walker (2 wheeled) Transfers: Sit to/from Stand Sit to Stand: Min assist         General transfer comment: cues for use of UEs to self assist  Ambulation/Gait Ambulation/Gait assistance: Min assist Gait Distance (Feet): 150 Feet Assistive device: Rolling walker (2 wheeled) Gait Pattern/deviations: Step-to pattern;Step-through pattern;Decreased step length - right;Decreased step length - left;Shuffle;Trunk flexed Gait velocity: decr   General Gait Details: cues for posture, position from RW and initial sequence  Stairs            Wheelchair Mobility    Modified Rankin (Stroke Patients Only)       Balance Overall balance assessment: Needs assistance Sitting-balance support: No upper extremity supported;Feet supported Sitting balance-Leahy Scale:  Good     Standing balance support: No upper extremity supported Standing balance-Leahy Scale: Fair                               Pertinent Vitals/Pain Pain Assessment: Faces Faces Pain Scale: Hurts a little bit Pain Location: R hip Pain Descriptors / Indicators: Tightness Pain Intervention(s): Limited activity within patient's tolerance;Monitored during session;Premedicated before session;Ice applied    Home Living Family/patient expects to be discharged to:: Skilled nursing facility                 Additional Comments: Pt is at [facility] but would benefit from follow up in memory care unit    Prior Function Level of Independence: Independent         Comments: No assistive device for ambulation     Hand Dominance        Extremity/Trunk Assessment   Upper Extremity Assessment Upper Extremity Assessment: Overall WFL for tasks assessed    Lower Extremity Assessment Lower Extremity Assessment: RLE deficits/detail RLE Deficits / Details: Strength 3/5 at hip with AAROM at hip to 95 flex and 25 abd    Cervical / Trunk Assessment Cervical / Trunk Assessment: Kyphotic  Communication   Communication: HOH  Cognition Arousal/Alertness: Awake/alert Behavior During Therapy: WFL for tasks assessed/performed Overall Cognitive Status: History of cognitive impairments - at baseline                                 General Comments: Very limited short-term memory  General Comments      Exercises Total Joint Exercises Ankle Circles/Pumps: AROM;Both;20 reps;Supine Quad Sets: AROM;Both;10 reps;Supine Heel Slides: Right;20 reps;Supine;AAROM;AROM Hip ABduction/ADduction: AAROM;AROM;Right;15 reps;Supine   Assessment/Plan    PT Assessment Patient needs continued PT services  PT Problem List Decreased strength;Decreased activity tolerance;Decreased balance;Decreased mobility;Decreased cognition;Decreased knowledge of use of  DME;Pain;Decreased safety awareness       PT Treatment Interventions DME instruction;Gait training;Functional mobility training;Therapeutic activities;Therapeutic exercise;Patient/family education    PT Goals (Current goals can be found in the Care Plan section)  Acute Rehab PT Goals Patient Stated Goal: Home PT Goal Formulation: Patient unable to participate in goal setting Time For Goal Achievement: 05/18/18 Potential to Achieve Goals: Good    Frequency Min 6X/week   Barriers to discharge        Co-evaluation               AM-PAC PT "6 Clicks" Daily Activity  Outcome Measure                  End of Session Equipment Utilized During Treatment: Gait belt Activity Tolerance: Patient tolerated treatment well Patient left: in chair;with call bell/phone within reach;with family/visitor present;with chair alarm set Nurse Communication: Mobility status PT Visit Diagnosis: Unsteadiness on feet (R26.81);Difficulty in walking, not elsewhere classified (R26.2)    Time: 1010-1035 PT Time Calculation (min) (ACUTE ONLY): 25 min   Charges:   PT Evaluation $PT Eval Low Complexity: 1 Low PT Treatments $Therapeutic Exercise: 8-22 mins        Debe Coder PT Acute Rehabilitation Services Pager [phone] Office [phone]   Nichole [name] 05/11/2018, 1:53 PM

## 2018-05-12 DIAGNOSIS — E538 Deficiency of other specified B group vitamins: Secondary | ICD-10-CM | POA: Diagnosis not present

## 2018-05-12 DIAGNOSIS — W19XXXA Unspecified fall, initial encounter: Secondary | ICD-10-CM

## 2018-05-12 DIAGNOSIS — Z743 Need for continuous supervision: Secondary | ICD-10-CM | POA: Diagnosis not present

## 2018-05-12 DIAGNOSIS — F0280 Dementia in other diseases classified elsewhere without behavioral disturbance: Secondary | ICD-10-CM | POA: Diagnosis not present

## 2018-05-12 DIAGNOSIS — E871 Hypo-osmolality and hyponatremia: Secondary | ICD-10-CM | POA: Diagnosis not present

## 2018-05-12 DIAGNOSIS — F329 Major depressive disorder, single episode, unspecified: Secondary | ICD-10-CM | POA: Diagnosis not present

## 2018-05-12 DIAGNOSIS — M6281 Muscle weakness (generalized): Secondary | ICD-10-CM | POA: Diagnosis not present

## 2018-05-12 DIAGNOSIS — Z471 Aftercare following joint replacement surgery: Secondary | ICD-10-CM | POA: Diagnosis not present

## 2018-05-12 DIAGNOSIS — R279 Unspecified lack of coordination: Secondary | ICD-10-CM | POA: Diagnosis not present

## 2018-05-12 DIAGNOSIS — S72031D Displaced midcervical fracture of right femur, subsequent encounter for closed fracture with routine healing: Secondary | ICD-10-CM | POA: Diagnosis not present

## 2018-05-12 DIAGNOSIS — F4321 Adjustment disorder with depressed mood: Secondary | ICD-10-CM | POA: Diagnosis not present

## 2018-05-12 DIAGNOSIS — R278 Other lack of coordination: Secondary | ICD-10-CM | POA: Diagnosis not present

## 2018-05-12 DIAGNOSIS — F0390 Unspecified dementia without behavioral disturbance: Secondary | ICD-10-CM | POA: Diagnosis not present

## 2018-05-12 DIAGNOSIS — I10 Essential (primary) hypertension: Secondary | ICD-10-CM | POA: Diagnosis not present

## 2018-05-12 DIAGNOSIS — R2689 Other abnormalities of gait and mobility: Secondary | ICD-10-CM | POA: Diagnosis not present

## 2018-05-12 DIAGNOSIS — F322 Major depressive disorder, single episode, severe without psychotic features: Secondary | ICD-10-CM | POA: Diagnosis not present

## 2018-05-12 DIAGNOSIS — Y92009 Unspecified place in unspecified non-institutional (private) residence as the place of occurrence of the external cause: Secondary | ICD-10-CM

## 2018-05-12 DIAGNOSIS — S72001A Fracture of unspecified part of neck of right femur, initial encounter for closed fracture: Secondary | ICD-10-CM | POA: Diagnosis not present

## 2018-05-12 DIAGNOSIS — D519 Vitamin B12 deficiency anemia, unspecified: Secondary | ICD-10-CM | POA: Diagnosis not present

## 2018-05-12 DIAGNOSIS — F4325 Adjustment disorder with mixed disturbance of emotions and conduct: Secondary | ICD-10-CM | POA: Diagnosis not present

## 2018-05-12 DIAGNOSIS — G301 Alzheimer's disease with late onset: Secondary | ICD-10-CM | POA: Diagnosis not present

## 2018-05-12 DIAGNOSIS — Z4732 Aftercare following explantation of hip joint prosthesis: Secondary | ICD-10-CM | POA: Diagnosis not present

## 2018-05-12 DIAGNOSIS — G309 Alzheimer's disease, unspecified: Secondary | ICD-10-CM | POA: Diagnosis not present

## 2018-05-12 DIAGNOSIS — D500 Iron deficiency anemia secondary to blood loss (chronic): Secondary | ICD-10-CM | POA: Diagnosis not present

## 2018-05-12 DIAGNOSIS — S72001D Fracture of unspecified part of neck of right femur, subsequent encounter for closed fracture with routine healing: Secondary | ICD-10-CM | POA: Diagnosis not present

## 2018-05-12 DIAGNOSIS — Z96641 Presence of right artificial hip joint: Secondary | ICD-10-CM | POA: Diagnosis not present

## 2018-05-12 DIAGNOSIS — R41841 Cognitive communication deficit: Secondary | ICD-10-CM | POA: Diagnosis not present

## 2018-05-12 DIAGNOSIS — D649 Anemia, unspecified: Secondary | ICD-10-CM | POA: Diagnosis not present

## 2018-05-12 DIAGNOSIS — I959 Hypotension, unspecified: Secondary | ICD-10-CM | POA: Diagnosis not present

## 2018-05-12 LAB — CBC
HCT: 32.3 % — ABNORMAL LOW (ref 36.0–46.0)
HEMOGLOBIN: 10.2 g/dL — AB (ref 12.0–15.0)
MCH: 31.9 pg (ref 26.0–34.0)
MCHC: 31.6 g/dL (ref 30.0–36.0)
MCV: 100.9 fL — ABNORMAL HIGH (ref 80.0–100.0)
Platelets: 154 10*3/uL (ref 150–400)
RBC: 3.2 MIL/uL — AB (ref 3.87–5.11)
RDW: 13.3 % (ref 11.5–15.5)
WBC: 8.3 10*3/uL (ref 4.0–10.5)
nRBC: 0 % (ref 0.0–0.2)

## 2018-05-12 LAB — BASIC METABOLIC PANEL
ANION GAP: 6 (ref 5–15)
BUN: 20 mg/dL (ref 8–23)
CALCIUM: 8.7 mg/dL — AB (ref 8.9–10.3)
CHLORIDE: 102 mmol/L (ref 98–111)
CO2: 27 mmol/L (ref 22–32)
CREATININE: 0.94 mg/dL (ref 0.44–1.00)
GFR calc Af Amer: 59 mL/min — ABNORMAL LOW (ref >60)
GFR calc non Af Amer: 51 mL/min — ABNORMAL LOW (ref >60)
GLUCOSE: 96 mg/dL (ref 70–99)
Potassium: 4.2 mmol/L (ref 3.5–5.1)
Sodium: 135 mmol/L (ref 135–145)

## 2018-05-12 MED ORDER — DOCUSATE SODIUM 100 MG PO CAPS: 100.0000 mg | ORAL_CAPSULE | Freq: Two times a day (BID) | ORAL | 0 refills | Status: DC

## 2018-05-12 MED ORDER — ENSURE ENLIVE PO LIQD: 237.0000 mL | Freq: Two times a day (BID) | ORAL | 12 refills | Status: DC

## 2018-05-12 MED ORDER — SENNA 8.6 MG PO TABS: 1.0000 | ORAL_TABLET | Freq: Two times a day (BID) | ORAL | 0 refills | Status: DC

## 2018-05-12 MED ORDER — POLYETHYLENE GLYCOL 3350 17 G PO PACK: 17.0000 g | PACK | Freq: Every day | ORAL | 0 refills | Status: DC

## 2018-05-12 MED ORDER — CYANOCOBALAMIN 1000 MCG/ML IJ SOLN
1000.0000 ug | Freq: Once | INTRAMUSCULAR | Status: AC
  Administered 2018-05-12: 1000 ug via INTRAMUSCULAR
  Filled 2018-05-12: qty 1

## 2018-05-12 MED ORDER — CYANOCOBALAMIN 1000 MCG/ML IJ SOLN: 100.0000 ug | Freq: Every day | INTRAMUSCULAR | 0 refills | Status: AC

## 2018-05-12 NOTE — Progress Notes (Signed)
Physical Therapy Treatment Patient Details Name: Nichole [name] MRN: [id number] DOB: Apr 30, 1923 Today's Date: 05/12/2018    History of Present Illness Pt s/p R hip fx with hemi-arthroplasty repair by anterior approach.  Pt with hx of TIA, Alzheimers and breast CA s/p mastectomy    PT Comments    Pt mildly agitated this am but willing to "walk as far as you want me to".   Pt continues to progress with mobility - ambulating increased distance but mildly unstable with RW.   Follow Up Recommendations  SNF     Equipment Recommendations  Rolling walker with 5" wheels    Recommendations for Other Services       Precautions / Restrictions Precautions Precautions: Fall Restrictions Weight Bearing Restrictions: No Other Position/Activity Restrictions: WBAT    Mobility  Bed Mobility Overal bed mobility: Needs Assistance Bed Mobility: Supine to Sit     Supine to sit: Supervision     General bed mobility comments: no physical assist  Transfers Overall transfer level: Needs assistance Equipment used: Rolling walker (2 wheeled) Transfers: Sit to/from Stand Sit to Stand: Min guard         General transfer comment: cues for use of UEs to self assist  Ambulation/Gait Ambulation/Gait assistance: Min assist;Min guard Gait Distance (Feet): 500 Feet Assistive device: Rolling walker (2 wheeled) Gait Pattern/deviations: Step-to pattern;Step-through pattern;Decreased step length - right;Decreased step length - left;Shuffle;Trunk flexed Gait velocity: decr   General Gait Details: cues for posture, position from RW and initial sequence   Stairs             Wheelchair Mobility    Modified Rankin (Stroke Patients Only)       Balance Overall balance assessment: Needs assistance Sitting-balance support: No upper extremity supported;Feet supported Sitting balance-Leahy Scale: Good     Standing balance support: No upper extremity supported Standing balance-Leahy  Scale: Fair                              Cognition Arousal/Alertness: Awake/alert Behavior During Therapy: WFL for tasks assessed/performed Overall Cognitive Status: History of cognitive impairments - at baseline                                 General Comments: Very limited short-term memory      Exercises      General Comments        Pertinent Vitals/Pain Pain Assessment: Faces Faces Pain Scale: Hurts a little bit Pain Location: R hip Pain Descriptors / Indicators: Tightness Pain Intervention(s): Limited activity within patient's tolerance;Monitored during session;Premedicated before session    Home Living                      Prior Function            PT Goals (current goals can now be found in the care plan section) Acute Rehab PT Goals Patient Stated Goal: Home PT Goal Formulation: Patient unable to participate in goal setting Time For Goal Achievement: 05/18/18 Potential to Achieve Goals: Good Progress towards PT goals: Progressing toward goals    Frequency    Min 6X/week      PT Plan Current plan remains appropriate    Co-evaluation              AM-PAC PT "6 Clicks" Daily Activity  Outcome Measure  Difficulty turning over in  bed (including adjusting bedclothes, sheets and blankets)?: A Lot Difficulty moving from lying on back to sitting on the side of the bed? : A Lot Difficulty sitting down on and standing up from a chair with arms (e.g., wheelchair, bedside commode, etc,.)?: A Lot Help needed moving to and from a bed to chair (including a wheelchair)?: A Little Help needed walking in hospital room?: A Little Help needed climbing 3-5 steps with a railing? : A Little 6 Click Score: 15    End of Session Equipment Utilized During Treatment: Gait belt Activity Tolerance: Patient tolerated treatment well Patient left: with call bell/phone within reach;in bed;with bed alarm set Nurse Communication: Mobility  status PT Visit Diagnosis: Unsteadiness on feet (R26.81);Difficulty in walking, not elsewhere classified (R26.2)     Time: 0962-8366 PT Time Calculation (min) (ACUTE ONLY): 23 min  Charges:  $Gait Training: 23-37 mins                     [place] Pager [phone] Office [phone]    Louanne Calvillo 05/12/2018, 1:06 PM

## 2018-05-12 NOTE — Clinical Social Work Placement (Signed)
D/C Summary sent Nurse given the number to call report.  [facility] to transport.  Daughter will complete admissions paperwork 2:15pm.   CLINICAL SOCIAL WORK PLACEMENT  NOTE  Date:  05/12/2018  Patient Details  Name: Nichole [name] MRN: [id number] Date of Birth: Nov 13, 1922  Clinical Social Work is seeking post-discharge placement for this patient at the [place] level of care (*CSW will initial, date and re-position this form in  chart as items are completed):  Yes   Patient/family provided with [place] Work Department's list of facilities offering this level of care within the geographic area requested by the patient (or if unable, by the patient's family).  Yes   Patient/family informed of their freedom to choose among providers that offer the needed level of care, that participate in Medicare, Medicaid or managed care program needed by the patient, have an available bed and are willing to accept the patient.  Yes   Patient/family informed of [facility]'s ownership interest in [facility] and [facility], as well as of the fact that they are under no obligation to receive care at these facilities.  PASRR submitted to EDS on 05/12/18     PASRR number received on 05/12/18     Existing PASRR number confirmed on       FL2 transmitted to all facilities in geographic area requested by pt/family on       FL2 transmitted to all facilities within larger geographic area on 05/12/18     Patient informed that his/her managed care company has contracts with or will negotiate with certain facilities, including the following:  [facility]     Yes   Patient/family informed of bed offers received.  Patient chooses bed at [facility]     Physician recommends and patient chooses bed at      Patient to be transferred to [facility] on 05/12/18.  Patient to be transferred to facility by PT[facility]     Patient  family notified on 05/12/18 of transfer.  Name of family member notified:  Daughter Margaretha Sheffield     PHYSICIAN       Additional Comment:    _______________________________________________ Lia Hopping, LCSW 05/12/2018, 11:04 AM

## 2018-05-12 NOTE — Plan of Care (Signed)
Pt alert, disoriented, but pleasant.  Did well with PT this am.  Pain well controlled. Plan to d/c per MD order. Plan of care discussed with pt and family at the bedside. RN will monitor.

## 2018-05-12 NOTE — Discharge Summary (Addendum)
Physician Discharge Summary  Nichole [name] KXF:[id number] DOB: 21-Nov-1922 DOA: 05/09/2018  PCP: Aretta Nip, MD  Admit date: 05/09/2018 Discharge date: 05/12/2018  Admitted From: Nichole [name], Independent living facility  Disposition: SNF  Recommendations for Outpatient Follow-up:  1. Follow up with PCP in 1-2 weeks 2. Please obtain BMP/CBC in one week 3. Please order oral vitamin B 12 supplement after she finished IM injection.    Discharge Condition: stable.  CODE STATUS: DNR Diet recommendation: Heart Healthy   Brief/Interim Summary: Brief Narrative: 84 year old female with history of memory problems was brought to hospital after she had a fall at home. X-ray of the hip showed right femoral subcapital fracture.   Assessment & Plan:   Active Problems:   Hip fracture (HCC)   Closed displaced fracture of right femoral neck (HCC)   Hip fracture; right  S/P sx 11-05:Righthip hemiarthroplasty Bowel regimen. Miralax.  DVT prophylaxis per ortho. Discharge on aspirin.  Patient will be discharge to SNF, patient is confuse, does not realized she had surgery. She does not have capacity at this time to make living arrangement. Daughter will help with medical decisions. patient has history dementia and is confuse.   Dementia; continue with Aricept.  Daughter report worsening memory.  B 12 ordered. And was low. Start supplement.   B 12 deficiency; IM injection for 1 week. Will need oral supplement after.   Depression;  Per daughter patient has been saying she will starve to death, if she could open the window she would jump.  Psych consulted. Clear by psych. No evidence of depression or suicidal  Patient has been eating.   Mild hyponatremia;  resolved  Mild AKI; resolved. Cr peak to 1.1. Baseline 0.8.  Improved with fluids.   Acute blood loss anemia; post sx expected.hb stable.   Fall at home; now need PT. SNF.  Follow up with PCP '   Discharge Diagnoses:   Principal Problem:   Adjustment disorder with mixed disturbance of emotions and conduct Active Problems:   Hip fracture ([place])   Closed displaced fracture of right femoral neck ([place])   Fall at home, initial encounter    Discharge Instructions  Discharge Instructions    Diet - low sodium heart healthy   Complete by:  As directed    Increase activity slowly   Complete by:  As directed      Allergies as of 05/12/2018   No Known Allergies     Medication List    STOP taking these medications   sodium chloride 1 g tablet     TAKE these medications   aspirin 81 MG tablet Take 1 tablet (81 mg total) by mouth 2 (two) times daily after a meal.   cyanocobalamin 1000 MCG/ML injection Commonly known as:  (VITAMIN B-12) Inject 0.1 mLs (100 mcg total) into the muscle daily for 5 doses.   docusate sodium 100 MG capsule Commonly known as:  COLACE Take 1 capsule (100 mg total) by mouth 2 (two) times daily.   donepezil 5 MG tablet Commonly known as:  ARICEPT Take 5 mg by mouth at bedtime.   feeding supplement (ENSURE ENLIVE) Liqd Take 237 mLs by mouth 2 (two) times daily between meals.   polyethylene glycol packet Commonly known as:  MIRALAX / GLYCOLAX Take 17 g by mouth daily. Start taking on:  05/13/2018   senna 8.6 MG Tabs tablet Commonly known as:  SENOKOT Take 1 tablet (8.6 mg total) by mouth 2 (two) times daily.   traMADol 50  MG tablet Commonly known as:  ULTRAM Take 1 tablet (50 mg total) by mouth every 6 (six) hours as needed. What changed:    when to take this  reasons to take this      Follow-up Information    Swinteck, Aaron Edelman, MD. Schedule an appointment as soon as possible for a visit in 2 weeks.   Specialty:  Orthopedic Surgery Why:  For wound re-check Contact information: [address] [place] [address] [phone]          No Known Allergies  Consultations:  Ortho    Procedures/Studies: Dg Chest 2 View  Result  Date: 05/09/2018 CLINICAL DATA:  Preop hip surgery EXAM: CHEST - 2 VIEW COMPARISON:  11/14/2015 FINDINGS: Posterior lung opacity. No pleural effusion. Borderline cardiomegaly with aortic atherosclerosis. No pneumothorax. IMPRESSION: Posterior lung opacity, may reflect dependent atelectasis although infiltrate difficult to exclude. Electronically Signed   By: Donavan Foil M.D.   On: 05/09/2018 19:41   Pelvis Portable  Result Date: 05/10/2018 CLINICAL DATA:  84 year old female with right femoral neck fracture. Subsequent encounter. EXAM: PORTABLE PELVIS 1-2 VIEWS COMPARISON:  05/09/2018. FINDINGS: Post total right hip replacement which appears in satisfactory position without complication noted on frontal projection. Foley catheter in place.  Left hip joint degenerative changes. IMPRESSION: Post total right hip replacement. Electronically Signed   By: Genia Del M.D.   On: 05/10/2018 14:51   Dg Knee Complete 4 Views Right  Result Date: 05/09/2018 CLINICAL DATA:  Initial evaluation for acute trauma, fall. EXAM: RIGHT KNEE - COMPLETE 4+ VIEW COMPARISON:  None. FINDINGS: No acute fracture or dislocation. No joint effusion. Moderate tricompartmental degenerative osteoarthrosis. Associated chondrocalcinosis. No acute soft tissue abnormality. Bones mildly osteopenic. IMPRESSION: 1. No acute osseous abnormality about the knee. 2. Moderate tricompartmental degenerative osteoarthrosis. Electronically Signed   By: Jeannine Boga M.D.   On: 05/09/2018 16:46   Dg C-arm 1-60 Min-no Report  Result Date: 05/10/2018 Fluoroscopy was utilized by the requesting physician.  No radiographic interpretation.   Dg Hip Operative Unilat With Pelvis Right  Result Date: 05/10/2018 CLINICAL DATA:  RIGHT hip hemiarthroplasty EXAM: OPERATIVE RIGHT HIP (WITH PELVIS IF PERFORMED) 2 VIEWS TECHNIQUE: Fluoroscopic spot image(s) were submitted for interpretation post-operatively. COMPARISON:  05/09/2018 radiographs FINDINGS:  RIGHT hip hemiarthroplasty noted without complicating features. No dislocation on this single view. IMPRESSION: RIGHT hip hemiarthroplasty without complicating features. Electronically Signed   By: Margarette Canada M.D.   On: 05/10/2018 13:48   Dg Hip Unilat  With Pelvis 2-3 Views Right  Result Date: 05/09/2018 CLINICAL DATA:  Fall.  Right hip and knee pain. EXAM: DG HIP (WITH OR WITHOUT PELVIS) 2-3V RIGHT COMPARISON:  Right femur 05/09/2018 FINDINGS: Subcapital fracture involving the proximal right femur. Fracture is displaced with superior displacement of the right femoral trochanters. There may be a small bone fragment near the superolateral aspect of the right femoral head. Right femoral head appears to be located. Pelvic bony ring is intact. Sclerosis at the symphysis pubis. Joint space narrowing involving the superior left hip joint. Curvature and degenerative changes in the lower lumbar spine. IMPRESSION: Right femoral subcapital fracture. Electronically Signed   By: Markus Daft M.D.   On: 05/09/2018 16:47   Dg Femur 1v Right  Result Date: 05/09/2018 CLINICAL DATA:  Initial evaluation for acute trauma, fall. EXAM: RIGHT FEMUR 1 VIEW COMPARISON:  None. FINDINGS: Acute fracture extending through the subcapital right femoral neck partially visualized. Associated mild superior subluxation. Finding better evaluated on concomitant  radiograph of the right hip. Subtrochanteric femur otherwise intact. No other acute osseous abnormality. Moderate to advanced degenerative osteoarthrosis noted about the knee. IMPRESSION: 1. Acute fracture involving the subcapital right femoral neck with mild superior subluxation, better evaluated on concomitant radiograph of the right hip. 2. No other acute osseous abnormality about the femur. Electronically Signed   By: Jeannine Boga M.D.   On: 05/09/2018 16:44   (Echo, Carotid, EGD, Colonoscopy, ERCP)    Subjective:   Discharge Exam: Vitals:   05/11/18 2128 05/12/18  0545  BP: (!) 155/62 (!) 148/54  Pulse: 89 78  Resp:  16  Temp: 97.8 F (36.6 C) (!) 97.4 F (36.3 C)  SpO2: 94% 97%   Vitals:   05/11/18 0951 05/11/18 1421 05/11/18 2128 05/12/18 0545  BP: (!) 133/48 (!) 131/46 (!) 155/62 (!) 148/54  Pulse: 73 64 89 78  Resp: 14 14  16   Temp: 97.8 F (36.6 C) 98.1 F (36.7 C) 97.8 F (36.6 C) (!) 97.4 F (36.3 C)  TempSrc:  Oral Oral Oral  SpO2: 97% 99% 94% 97%  Weight:      Height:        General: Pt is alert, awake, not in acute distress Cardiovascular: RRR, S1/S2 +, no rubs, no gallops Respiratory: CTA bilaterally, no wheezing, no rhonchi Abdominal: Soft, NT, ND, bowel sounds + Extremities: no edema, no cyanosis    The results of significant diagnostics from this hospitalization (including imaging, microbiology, ancillary and laboratory) are listed below for reference.     Microbiology: Recent Results (from the past 240 hour(s))  Surgical PCR screen     Status: None   Collection Time: 05/09/18 10:00 PM  Result Value Ref Range Status   MRSA, PCR NEGATIVE NEGATIVE Final   Staphylococcus aureus NEGATIVE NEGATIVE Final    Comment: (NOTE) The Xpert SA Assay (FDA approved for NASAL specimens in patients 8 years of age and older), is one component of a comprehensive surveillance program. It is not intended to diagnose infection nor to guide or monitor treatment. Performed at [facility], [place] [address], [place], [place] [address]      Labs: BNP (last 3 results) No results for input(s): BNP in the last 8760 hours. Basic Metabolic Panel: Recent Labs  Lab 05/09/18 1743 05/09/18 1754 05/10/18 1606 05/11/18 0525 05/12/18 0541  NA 137 136  --  134* 135  K 4.4 4.8  --  4.4 4.2  CL 100 101  --  102 102  CO2 28  --   --  25 27  GLUCOSE 126* 124*  --  132* 96  BUN 21 26*  --  15 20  CREATININE 1.11* 1.10* 1.01* 0.93 0.94  CALCIUM 9.6  --   --  8.5* 8.7*   Liver Function Tests: Recent Labs  Lab  05/09/18 1743  AST 39  ALT 26  ALKPHOS 77  BILITOT 0.8  PROT 8.0  ALBUMIN 4.2   No results for input(s): LIPASE, AMYLASE in the last 168 hours. No results for input(s): AMMONIA in the last 168 hours. CBC: Recent Labs  Lab 05/09/18 1743 05/09/18 1754 05/10/18 1606 05/11/18 0525 05/12/18 0541  WBC 9.3  --  11.1* 9.5 8.3  NEUTROABS 8.0*  --   --   --   --   HGB 12.5 12.9 12.8 10.5* 10.2*  HCT 39.6 38.0 40.2 33.0* 32.3*  MCV 99.2  --  98.8 97.6 100.9*  PLT 223  --  192 147* 154  Cardiac Enzymes: No results for input(s): CKTOTAL, CKMB, CKMBINDEX, TROPONINI in the last 168 hours. BNP: Invalid input(s): POCBNP CBG: No results for input(s): GLUCAP in the last 168 hours. D-Dimer No results for input(s): DDIMER in the last 72 hours. Hgb A1c No results for input(s): HGBA1C in the last 72 hours. Lipid Profile No results for input(s): CHOL, HDL, LDLCALC, TRIG, CHOLHDL, LDLDIRECT in the last 72 hours. Thyroid function studies Recent Labs    05/11/18 1039  TSH 1.623   Anemia work up Recent Labs    05/11/18 1039  VITAMINB12 102*   Urinalysis    Component Value Date/Time   COLORURINE YELLOW 11/13/2015 1545   APPEARANCEUR CLOUDY (A) 11/13/2015 1545   LABSPEC 1.014 11/13/2015 1545   PHURINE 6.5 11/13/2015 1545   GLUCOSEU NEGATIVE 11/13/2015 1545   HGBUR MODERATE (A) 11/13/2015 1545   BILIRUBINUR NEGATIVE 11/13/2015 1545   KETONESUR 15 (A) 11/13/2015 1545   PROTEINUR NEGATIVE 11/13/2015 1545   NITRITE NEGATIVE 11/13/2015 1545   LEUKOCYTESUR NEGATIVE 11/13/2015 1545   Sepsis Labs Invalid input(s): PROCALCITONIN,  WBC,  LACTICIDVEN Microbiology Recent Results (from the past 240 hour(s))  Surgical PCR screen     Status: None   Collection Time: 05/09/18 10:00 PM  Result Value Ref Range Status   MRSA, PCR NEGATIVE NEGATIVE Final   Staphylococcus aureus NEGATIVE NEGATIVE Final    Comment: (NOTE) The Xpert SA Assay (FDA approved for NASAL specimens in patients  41 years of age and older), is one component of a comprehensive surveillance program. It is not intended to diagnose infection nor to guide or monitor treatment. Performed at [facility], [place] [address] [place], [place] [address]      Time coordinating discharge: 35 minutes.   SIGNED:   Elmarie Shiley, MD  [facility] 05/12/2018, 9:33 AM Pager   If 7PM-7AM, please contact night-coverage [email or web address] Password [email or web address]

## 2018-05-12 NOTE — Progress Notes (Signed)
    Subjective:  Patient reports pain as mild to moderate.  Denies N/V/CP/SOB. Confused.  Objective:   VITALS:   Vitals:   05/11/18 0951 05/11/18 1421 05/11/18 2128 05/12/18 0545  BP: (!) 133/48 (!) 131/46 (!) 155/62 (!) 148/54  Pulse: 73 64 89 78  Resp: 14 14  16   Temp: 97.8 F (36.6 C) 98.1 F (36.7 C) 97.8 F (36.6 C) (!) 97.4 F (36.3 C)  TempSrc:  Oral Oral Oral  SpO2: 97% 99% 94% 97%  Weight:      Height:        NAD ABD soft Sensation intact distally Intact pulses distally Dorsiflexion/Plantar flexion intact Incision: dressing C/D/I Compartment soft   Lab Results  Component Value Date   WBC 8.3 05/12/2018   HGB 10.2 (L) 05/12/2018   HCT 32.3 (L) 05/12/2018   MCV 100.9 (H) 05/12/2018   PLT 154 05/12/2018   BMET    Component Value Date/Time   NA 135 05/12/2018 0541   K 4.2 05/12/2018 0541   CL 102 05/12/2018 0541   CO2 27 05/12/2018 0541   GLUCOSE 96 05/12/2018 0541   BUN 20 05/12/2018 0541   CREATININE 0.94 05/12/2018 0541   CALCIUM 8.7 (L) 05/12/2018 0541   GFRNONAA 51 (L) 05/12/2018 0541   GFRAA 59 (L) 05/12/2018 0541     Assessment/Plan: 2 Days Post-Op   Principal Problem:   Adjustment disorder with mixed disturbance of emotions and conduct Active Problems:   Hip fracture (HCC)   Closed displaced fracture of right femoral neck (HCC)   WBAT with walker DVT ppx: Lovenox in house --> home on ASA 81 mg PO BID, SCDs, TEDS PO pain control PT/OT Dispo: SNF placement   Hilton Cork Syan Cullimore 05/12/2018, 9:26 AM   Rod Can, MD Cell: [phone] [facility] [address], [address], [place], [place] [address] Phone: [phone] [email or web address] Facebook  [employer]

## 2018-05-12 NOTE — Clinical Social Work Note (Addendum)
Clinical Social Work Assessment  Patient Details  Name: Nichole [name] MRN: [id number] Date of Birth: 04-12-1923  Date of referral:  05/12/18               Reason for consult:  Discharge Planning                Permission sought to share information with:  Facility Sport and exercise psychologist, Family Supports Permission granted to share information::  Yes, Verbal Permission Granted  Name::        Agency::  SNF  Relationship::  Daughter-Elaine   Contact Information:     Housing/Transportation Living arrangements for the past 2 months:  [place] of Information:  Patient Patient Interpreter Needed:  None Criminal Activity/Legal Involvement Pertinent to Current Situation/Hospitalization:  No - Comment as needed Significant Relationships:    Lives with:  Facility Resident Do you feel safe going back to the place where you live?  No(Daughter is transitioning the patient to memory care. ) Need for family participation in patient care:  Yes  Care giving concerns:   Patient admitted after fall. X-ray of the hip showed right femoral subcapital fracture.Patient will need SNF for rehab.  Patient evaluated by psychiatrist and recommends patient transition to a memory care unit.    Social Worker assessment / plan: Patient has dementia. CSW discussed patient discharge plan with her daughter Margaretha Sheffield and son in Neurosurgeon.Patient daughter express she has been working with [employer] staff to have patient placed in the memory care unit due to "short memory." Patient daughter reports the patient does not know that she has a fracture.  Per daughter request CSW inform patient she will transition to SNF today for rehab. Patient does not understand the current situation.  Patient daughter reports she learn good things about [facility] from [facility] and inquired about placement there.  CSW inform them of SNF process and bed offers. Patient family agreeable to [facility] SNF    Plan: SNF  Employment status:  Retired Forensic scientist:  Medicare, Managed Care PT Recommendations:  [place] / Referral to community resources:  [place]  Patient/Family's Response to care: Patient family responding well to CSW support.   Patient/Family's Understanding of and Emotional Response to Diagnosis, Current Treatment, and Prognosis: Patient family has a good understanding of the patient diagnosis and need for memory care unit after rehab is complete.   Emotional Assessment Appearance:  Appears stated age Attitude/Demeanor/Rapport:    Affect (typically observed):    Orientation:  Oriented to Self Alcohol / Substance use:  Not Applicable Psych involvement (Current and /or in the community):  No (Comment)  Discharge Needs  Concerns to be addressed:  Discharge Planning Concerns Readmission within the last 30 days:  No Current discharge risk:  Cognitively Impaired, Dependent with Mobility, Physical Impairment Barriers to Discharge:  No Barriers Identified   Lia Hopping, LCSW 05/12/2018, 10:25 AM

## 2018-05-12 NOTE — NC FL2 (Addendum)
[place] LEVEL OF CARE SCREENING TOOL     IDENTIFICATION  Patient Name: Nichole [name] Birthdate: 04-Sep-1922 Sex: female Admission Date (Current Location): 05/09/2018  [facility] and [place] Number:  Herbalist and Address:  [facility],  [place] [address] [place]      Provider Number: [id number]  Attending Physician Name and Address:  Elmarie Shiley, MD  Relative Name and Phone Number:       Current Level of Care: SNF Recommended Level of Care: [place] Prior Approval Number:    Date Approved/Denied:   PASRR Number:   [id number]   Discharge Plan: SNF    Current Diagnoses: Patient Active Problem List   Diagnosis Date Noted  . Fall at home, initial encounter 05/12/2018  . Adjustment disorder with mixed disturbance of emotions and conduct   . Closed displaced fracture of right femoral neck ([place]) 05/10/2018  . Hip fracture ([place]) 05/09/2018  . Alzheimer disease ([place]) 08/25/2017  . SIADH (syndrome of inappropriate ADH production) ([place])   . HTN (hypertension) 11/16/2015  . Hyponatremia 11/13/2015    Orientation RESPIRATION BLADDER Height & Weight     Self  Normal Incontinent Weight: 135 lb (61.2 kg) Height:  5\' 5"  (165.1 cm)  BEHAVIORAL SYMPTOMS/MOOD NEUROLOGICAL BOWEL NUTRITION STATUS      Continent Diet(Low Sodium Heart Healthy )  AMBULATORY STATUS COMMUNICATION OF NEEDS Skin   Extensive Assist Verbally Normal                       Personal Care Assistance Level of Assistance  Bathing, Feeding, Dressing Bathing Assistance: Maximum assistance Feeding assistance: Independent Dressing Assistance: Maximum assistance     Functional Limitations Info  Sight, Hearing, Speech Sight Info: Adequate Hearing Info: Adequate Speech Info: Adequate    SPECIAL CARE FACTORS FREQUENCY  PT (By licensed PT), OT (By licensed OT)     PT Frequency: 5x/week OT Frequency: 5x/week             Contractures Contractures Info: Not present    Additional Factors Info  Code Status, Allergies, Psychotropic Code Status Info: DNR  Allergies Info: Allergies: No Known Allergies           Current Medications (05/12/2018):  This is the current hospital active medication list Current Facility-Administered Medications  Medication Dose Route Frequency Provider Last Rate Last Dose  . 0.9 %  sodium chloride infusion   Intravenous Continuous Rod Can, MD 75 mL/hr at 05/11/18 0741    . acetaminophen (TYLENOL) tablet 325-650 mg  325-650 mg Oral Q6H PRN Swinteck, Aaron Edelman, MD      . chlorhexidine (HIBICLENS) 4 % liquid 4 application  60 mL Topical Once [place], Marge Duncans, MD      . cyanocobalamin ((VITAMIN B-12)) injection 1,000 mcg  1,000 mcg Intramuscular Once Regalado, Belkys A, MD      . docusate sodium (COLACE) capsule 100 mg  100 mg Oral BID Rod Can, MD   100 mg at 05/12/18 0915  . donepezil (ARICEPT) tablet 5 mg  5 mg Oral QHS Oswald Hillock, MD   5 mg at 05/11/18 2209  . enoxaparin (LOVENOX) injection 30 mg  30 mg Subcutaneous Q24H Rod Can, MD   30 mg at 05/12/18 0914  . feeding supplement (ENSURE ENLIVE) (ENSURE ENLIVE) liquid 237 mL  237 mL Oral BID BM Regalado, Belkys A, MD   237 mL at 05/12/18 0918  . geriatric multivitamins-minerals (ELDERTONIC/GEVRABON) liquid 15  mL  15 mL Oral Daily Regalado, Belkys A, MD   15 mL at 05/12/18 0914  . HYDROcodone-acetaminophen (NORCO) 7.5-325 MG per tablet 1-2 tablet  1-2 tablet Oral Q4H PRN Swinteck, Aaron Edelman, MD      . HYDROcodone-acetaminophen (NORCO/VICODIN) 5-325 MG per tablet 1-2 tablet  1-2 tablet Oral Q6H PRN Oswald Hillock, MD   1 tablet at 05/11/18 2209  . menthol-cetylpyridinium (CEPACOL) lozenge 3 mg  1 lozenge Oral PRN Swinteck, Aaron Edelman, MD       Or  . phenol (CHLORASEPTIC) mouth spray 1 spray  1 spray Mouth/Throat PRN Swinteck, Aaron Edelman, MD      . morphine 2 MG/ML injection 0.5 mg  0.5 mg Intravenous Q2H PRN Oswald Hillock, MD       . ondansetron [facility]) tablet 4 mg  4 mg Oral Q6H PRN Swinteck, Aaron Edelman, MD       Or  . ondansetron (ZOFRAN) injection 4 mg  4 mg Intravenous Q6H PRN Swinteck, Aaron Edelman, MD      . polyethylene glycol (MIRALAX / GLYCOLAX) packet 17 g  17 g Oral Daily Regalado, Belkys A, MD   17 g at 05/12/18 0915  . povidone-iodine 10 % swab 2 application  2 application Topical Once [place], Gagan S, MD      . senna [facility]) tablet 8.6 mg  1 tablet Oral BID Rod Can, MD   8.6 mg at 05/12/18 0915     Discharge Medications: Please see discharge summary for a list of discharge medications.  Relevant Imaging Results:  Relevant Lab Results:   Additional Information SSN:[id number]  Ayris Carano A Ho Parisi, LCSW

## 2018-05-12 NOTE — Care Management Important Message (Signed)
Important Message  Patient Details  Name: Nichole [name] MRN: [id number] Date of Birth: Jul 20, 1922   Medicare Important Message Given:  Yes    Kerin Salen 05/12/2018, 12:24 [place] Message  Patient Details  Name: Nichole [name] MRN: [id number] Date of Birth: 13-Aug-1922   Medicare Important Message Given:  Yes    Kerin Salen 05/12/2018, 12:24 PM

## 2018-05-13 DIAGNOSIS — D519 Vitamin B12 deficiency anemia, unspecified: Secondary | ICD-10-CM | POA: Diagnosis not present

## 2018-05-13 DIAGNOSIS — I10 Essential (primary) hypertension: Secondary | ICD-10-CM | POA: Diagnosis not present

## 2018-05-13 DIAGNOSIS — R2689 Other abnormalities of gait and mobility: Secondary | ICD-10-CM | POA: Diagnosis not present

## 2018-05-13 DIAGNOSIS — D649 Anemia, unspecified: Secondary | ICD-10-CM | POA: Diagnosis not present

## 2018-05-13 DIAGNOSIS — F0390 Unspecified dementia without behavioral disturbance: Secondary | ICD-10-CM | POA: Diagnosis not present

## 2018-05-13 DIAGNOSIS — S72001A Fracture of unspecified part of neck of right femur, initial encounter for closed fracture: Secondary | ICD-10-CM | POA: Diagnosis not present

## 2018-05-13 DIAGNOSIS — F322 Major depressive disorder, single episode, severe without psychotic features: Secondary | ICD-10-CM | POA: Diagnosis not present

## 2018-05-13 DIAGNOSIS — F329 Major depressive disorder, single episode, unspecified: Secondary | ICD-10-CM | POA: Diagnosis not present

## 2018-05-13 DIAGNOSIS — E538 Deficiency of other specified B group vitamins: Secondary | ICD-10-CM | POA: Diagnosis not present

## 2018-05-13 DIAGNOSIS — S72001D Fracture of unspecified part of neck of right femur, subsequent encounter for closed fracture with routine healing: Secondary | ICD-10-CM | POA: Diagnosis not present

## 2018-05-13 NOTE — Telephone Encounter (Signed)
This pt had appt.

## 2018-05-18 DIAGNOSIS — F329 Major depressive disorder, single episode, unspecified: Secondary | ICD-10-CM | POA: Diagnosis not present

## 2018-05-18 DIAGNOSIS — E871 Hypo-osmolality and hyponatremia: Secondary | ICD-10-CM | POA: Diagnosis not present

## 2018-05-18 DIAGNOSIS — S72001D Fracture of unspecified part of neck of right femur, subsequent encounter for closed fracture with routine healing: Secondary | ICD-10-CM | POA: Diagnosis not present

## 2018-05-18 DIAGNOSIS — F0390 Unspecified dementia without behavioral disturbance: Secondary | ICD-10-CM | POA: Diagnosis not present

## 2018-05-25 ENCOUNTER — Other Ambulatory Visit: Payer: Self-pay | Admitting: *Deleted

## 2018-05-25 NOTE — Patient Outreach (Signed)
[facility]) Care Management  05/25/2018  Kendle Turbin 1923/06/05 [id number]   Onsite meeting at facility.  Patient set to go home next week 11/27. Patient from [place] and Vickii Chafe, SW at facility reports she has spoken with daughter who has  Hired private pay sitters in addition to home care that facility will set up. No DME needs noted.  Attempted to meet with patient, therapy was working with patient in her room. Plan to sign off as no [facility] care management needs assessed at this time.   Royetta Crochet. Laymond Purser, RN, BSN, [place] [phone]) Business Cell  [phone]) Toll Free Office

## 2018-05-26 DIAGNOSIS — E871 Hypo-osmolality and hyponatremia: Secondary | ICD-10-CM | POA: Diagnosis not present

## 2018-05-26 DIAGNOSIS — S72001D Fracture of unspecified part of neck of right femur, subsequent encounter for closed fracture with routine healing: Secondary | ICD-10-CM | POA: Diagnosis not present

## 2018-05-26 DIAGNOSIS — F0390 Unspecified dementia without behavioral disturbance: Secondary | ICD-10-CM | POA: Diagnosis not present

## 2018-05-26 DIAGNOSIS — F329 Major depressive disorder, single episode, unspecified: Secondary | ICD-10-CM | POA: Diagnosis not present

## 2018-05-31 DIAGNOSIS — Z4732 Aftercare following explantation of hip joint prosthesis: Secondary | ICD-10-CM | POA: Diagnosis not present

## 2018-05-31 DIAGNOSIS — F329 Major depressive disorder, single episode, unspecified: Secondary | ICD-10-CM | POA: Diagnosis not present

## 2018-05-31 DIAGNOSIS — Z471 Aftercare following joint replacement surgery: Secondary | ICD-10-CM | POA: Diagnosis not present

## 2018-05-31 DIAGNOSIS — S72031D Displaced midcervical fracture of right femur, subsequent encounter for closed fracture with routine healing: Secondary | ICD-10-CM | POA: Diagnosis not present

## 2018-05-31 DIAGNOSIS — Z96641 Presence of right artificial hip joint: Secondary | ICD-10-CM | POA: Diagnosis not present

## 2018-05-31 DIAGNOSIS — S72001D Fracture of unspecified part of neck of right femur, subsequent encounter for closed fracture with routine healing: Secondary | ICD-10-CM | POA: Diagnosis not present

## 2018-05-31 DIAGNOSIS — D519 Vitamin B12 deficiency anemia, unspecified: Secondary | ICD-10-CM | POA: Diagnosis not present

## 2018-05-31 DIAGNOSIS — F0390 Unspecified dementia without behavioral disturbance: Secondary | ICD-10-CM | POA: Diagnosis not present

## 2018-06-08 DIAGNOSIS — E538 Deficiency of other specified B group vitamins: Secondary | ICD-10-CM | POA: Diagnosis not present

## 2018-06-08 DIAGNOSIS — F0390 Unspecified dementia without behavioral disturbance: Secondary | ICD-10-CM | POA: Diagnosis not present

## 2018-06-08 DIAGNOSIS — R296 Repeated falls: Secondary | ICD-10-CM | POA: Diagnosis not present

## 2018-06-09 DIAGNOSIS — S72011D Unspecified intracapsular fracture of right femur, subsequent encounter for closed fracture with routine healing: Secondary | ICD-10-CM | POA: Diagnosis not present

## 2018-06-09 DIAGNOSIS — F0280 Dementia in other diseases classified elsewhere without behavioral disturbance: Secondary | ICD-10-CM | POA: Diagnosis not present

## 2018-06-09 DIAGNOSIS — Z96651 Presence of right artificial knee joint: Secondary | ICD-10-CM | POA: Diagnosis not present

## 2018-06-09 DIAGNOSIS — Z9181 History of falling: Secondary | ICD-10-CM | POA: Diagnosis not present

## 2018-06-09 DIAGNOSIS — D500 Iron deficiency anemia secondary to blood loss (chronic): Secondary | ICD-10-CM | POA: Diagnosis not present

## 2018-06-09 DIAGNOSIS — G309 Alzheimer's disease, unspecified: Secondary | ICD-10-CM | POA: Diagnosis not present

## 2018-06-09 DIAGNOSIS — Z8673 Personal history of transient ischemic attack (TIA), and cerebral infarction without residual deficits: Secondary | ICD-10-CM | POA: Diagnosis not present

## 2018-06-09 DIAGNOSIS — F4325 Adjustment disorder with mixed disturbance of emotions and conduct: Secondary | ICD-10-CM | POA: Diagnosis not present

## 2018-06-10 DIAGNOSIS — F0280 Dementia in other diseases classified elsewhere without behavioral disturbance: Secondary | ICD-10-CM | POA: Diagnosis not present

## 2018-06-10 DIAGNOSIS — G309 Alzheimer's disease, unspecified: Secondary | ICD-10-CM | POA: Diagnosis not present

## 2018-06-10 DIAGNOSIS — S72011D Unspecified intracapsular fracture of right femur, subsequent encounter for closed fracture with routine healing: Secondary | ICD-10-CM | POA: Diagnosis not present

## 2018-06-10 DIAGNOSIS — D500 Iron deficiency anemia secondary to blood loss (chronic): Secondary | ICD-10-CM | POA: Diagnosis not present

## 2018-06-10 DIAGNOSIS — Z96651 Presence of right artificial knee joint: Secondary | ICD-10-CM | POA: Diagnosis not present

## 2018-06-10 DIAGNOSIS — F4325 Adjustment disorder with mixed disturbance of emotions and conduct: Secondary | ICD-10-CM | POA: Diagnosis not present

## 2018-06-14 DIAGNOSIS — S72011D Unspecified intracapsular fracture of right femur, subsequent encounter for closed fracture with routine healing: Secondary | ICD-10-CM | POA: Diagnosis not present

## 2018-06-14 DIAGNOSIS — G309 Alzheimer's disease, unspecified: Secondary | ICD-10-CM | POA: Diagnosis not present

## 2018-06-14 DIAGNOSIS — F4325 Adjustment disorder with mixed disturbance of emotions and conduct: Secondary | ICD-10-CM | POA: Diagnosis not present

## 2018-06-14 DIAGNOSIS — D500 Iron deficiency anemia secondary to blood loss (chronic): Secondary | ICD-10-CM | POA: Diagnosis not present

## 2018-06-14 DIAGNOSIS — Z96651 Presence of right artificial knee joint: Secondary | ICD-10-CM | POA: Diagnosis not present

## 2018-06-14 DIAGNOSIS — F0280 Dementia in other diseases classified elsewhere without behavioral disturbance: Secondary | ICD-10-CM | POA: Diagnosis not present

## 2018-06-15 DIAGNOSIS — F0280 Dementia in other diseases classified elsewhere without behavioral disturbance: Secondary | ICD-10-CM | POA: Diagnosis not present

## 2018-06-15 DIAGNOSIS — G309 Alzheimer's disease, unspecified: Secondary | ICD-10-CM | POA: Diagnosis not present

## 2018-06-15 DIAGNOSIS — D500 Iron deficiency anemia secondary to blood loss (chronic): Secondary | ICD-10-CM | POA: Diagnosis not present

## 2018-06-15 DIAGNOSIS — Z96651 Presence of right artificial knee joint: Secondary | ICD-10-CM | POA: Diagnosis not present

## 2018-06-15 DIAGNOSIS — S72011D Unspecified intracapsular fracture of right femur, subsequent encounter for closed fracture with routine healing: Secondary | ICD-10-CM | POA: Diagnosis not present

## 2018-06-15 DIAGNOSIS — F4325 Adjustment disorder with mixed disturbance of emotions and conduct: Secondary | ICD-10-CM | POA: Diagnosis not present

## 2018-06-16 DIAGNOSIS — D500 Iron deficiency anemia secondary to blood loss (chronic): Secondary | ICD-10-CM | POA: Diagnosis not present

## 2018-06-16 DIAGNOSIS — Z96651 Presence of right artificial knee joint: Secondary | ICD-10-CM | POA: Diagnosis not present

## 2018-06-16 DIAGNOSIS — S72011D Unspecified intracapsular fracture of right femur, subsequent encounter for closed fracture with routine healing: Secondary | ICD-10-CM | POA: Diagnosis not present

## 2018-06-16 DIAGNOSIS — F0280 Dementia in other diseases classified elsewhere without behavioral disturbance: Secondary | ICD-10-CM | POA: Diagnosis not present

## 2018-06-16 DIAGNOSIS — F4325 Adjustment disorder with mixed disturbance of emotions and conduct: Secondary | ICD-10-CM | POA: Diagnosis not present

## 2018-06-16 DIAGNOSIS — G309 Alzheimer's disease, unspecified: Secondary | ICD-10-CM | POA: Diagnosis not present

## 2018-06-17 DIAGNOSIS — S72011D Unspecified intracapsular fracture of right femur, subsequent encounter for closed fracture with routine healing: Secondary | ICD-10-CM | POA: Diagnosis not present

## 2018-06-17 DIAGNOSIS — F0280 Dementia in other diseases classified elsewhere without behavioral disturbance: Secondary | ICD-10-CM | POA: Diagnosis not present

## 2018-06-17 DIAGNOSIS — D500 Iron deficiency anemia secondary to blood loss (chronic): Secondary | ICD-10-CM | POA: Diagnosis not present

## 2018-06-17 DIAGNOSIS — G309 Alzheimer's disease, unspecified: Secondary | ICD-10-CM | POA: Diagnosis not present

## 2018-06-17 DIAGNOSIS — F4325 Adjustment disorder with mixed disturbance of emotions and conduct: Secondary | ICD-10-CM | POA: Diagnosis not present

## 2018-06-17 DIAGNOSIS — Z96651 Presence of right artificial knee joint: Secondary | ICD-10-CM | POA: Diagnosis not present

## 2018-06-20 DIAGNOSIS — S72011D Unspecified intracapsular fracture of right femur, subsequent encounter for closed fracture with routine healing: Secondary | ICD-10-CM | POA: Diagnosis not present

## 2018-06-20 DIAGNOSIS — F0280 Dementia in other diseases classified elsewhere without behavioral disturbance: Secondary | ICD-10-CM | POA: Diagnosis not present

## 2018-06-20 DIAGNOSIS — F4325 Adjustment disorder with mixed disturbance of emotions and conduct: Secondary | ICD-10-CM | POA: Diagnosis not present

## 2018-06-20 DIAGNOSIS — D500 Iron deficiency anemia secondary to blood loss (chronic): Secondary | ICD-10-CM | POA: Diagnosis not present

## 2018-06-20 DIAGNOSIS — Z96651 Presence of right artificial knee joint: Secondary | ICD-10-CM | POA: Diagnosis not present

## 2018-06-20 DIAGNOSIS — G309 Alzheimer's disease, unspecified: Secondary | ICD-10-CM | POA: Diagnosis not present

## 2018-06-21 ENCOUNTER — Observation Stay (HOSPITAL_COMMUNITY)
Admission: EM | Admit: 2018-06-21 | Discharge: 2018-06-22 | Disposition: A | Discharge status: Skilled Nursing Facility | Payer: Medicare Other | Attending: Internal Medicine | Admitting: Internal Medicine

## 2018-06-21 ENCOUNTER — Encounter (HOSPITAL_COMMUNITY): Payer: Self-pay

## 2018-06-21 ENCOUNTER — Other Ambulatory Visit: Payer: Self-pay

## 2018-06-21 ENCOUNTER — Observation Stay (HOSPITAL_COMMUNITY): Payer: Medicare Other

## 2018-06-21 ENCOUNTER — Emergency Department (HOSPITAL_COMMUNITY): Payer: Medicare Other

## 2018-06-21 DIAGNOSIS — Z96651 Presence of right artificial knee joint: Secondary | ICD-10-CM | POA: Diagnosis not present

## 2018-06-21 DIAGNOSIS — R0602 Shortness of breath: Secondary | ICD-10-CM

## 2018-06-21 DIAGNOSIS — Z7901 Long term (current) use of anticoagulants: Secondary | ICD-10-CM | POA: Insufficient documentation

## 2018-06-21 DIAGNOSIS — F4325 Adjustment disorder with mixed disturbance of emotions and conduct: Secondary | ICD-10-CM | POA: Insufficient documentation

## 2018-06-21 DIAGNOSIS — G9389 Other specified disorders of brain: Secondary | ICD-10-CM | POA: Insufficient documentation

## 2018-06-21 DIAGNOSIS — R809 Proteinuria, unspecified: Secondary | ICD-10-CM | POA: Diagnosis not present

## 2018-06-21 DIAGNOSIS — F0280 Dementia in other diseases classified elsewhere without behavioral disturbance: Secondary | ICD-10-CM | POA: Insufficient documentation

## 2018-06-21 DIAGNOSIS — R823 Hemoglobinuria: Secondary | ICD-10-CM | POA: Diagnosis not present

## 2018-06-21 DIAGNOSIS — G309 Alzheimer's disease, unspecified: Secondary | ICD-10-CM | POA: Insufficient documentation

## 2018-06-21 DIAGNOSIS — R600 Localized edema: Secondary | ICD-10-CM | POA: Diagnosis not present

## 2018-06-21 DIAGNOSIS — E222 Syndrome of inappropriate secretion of antidiuretic hormone: Secondary | ICD-10-CM | POA: Diagnosis not present

## 2018-06-21 DIAGNOSIS — R9082 White matter disease, unspecified: Secondary | ICD-10-CM | POA: Insufficient documentation

## 2018-06-21 DIAGNOSIS — E538 Deficiency of other specified B group vitamins: Secondary | ICD-10-CM | POA: Insufficient documentation

## 2018-06-21 DIAGNOSIS — F0390 Unspecified dementia without behavioral disturbance: Secondary | ICD-10-CM | POA: Diagnosis not present

## 2018-06-21 DIAGNOSIS — R030 Elevated blood-pressure reading, without diagnosis of hypertension: Secondary | ICD-10-CM | POA: Diagnosis not present

## 2018-06-21 DIAGNOSIS — I10 Essential (primary) hypertension: Secondary | ICD-10-CM | POA: Diagnosis not present

## 2018-06-21 DIAGNOSIS — D649 Anemia, unspecified: Secondary | ICD-10-CM | POA: Diagnosis present

## 2018-06-21 DIAGNOSIS — D500 Iron deficiency anemia secondary to blood loss (chronic): Secondary | ICD-10-CM | POA: Diagnosis not present

## 2018-06-21 DIAGNOSIS — Z8673 Personal history of transient ischemic attack (TIA), and cerebral infarction without residual deficits: Secondary | ICD-10-CM | POA: Diagnosis not present

## 2018-06-21 DIAGNOSIS — J90 Pleural effusion, not elsewhere classified: Secondary | ICD-10-CM | POA: Diagnosis not present

## 2018-06-21 DIAGNOSIS — Z66 Do not resuscitate: Secondary | ICD-10-CM | POA: Insufficient documentation

## 2018-06-21 DIAGNOSIS — S72011D Unspecified intracapsular fracture of right femur, subsequent encounter for closed fracture with routine healing: Secondary | ICD-10-CM | POA: Diagnosis not present

## 2018-06-21 DIAGNOSIS — Z8249 Family history of ischemic heart disease and other diseases of the circulatory system: Secondary | ICD-10-CM | POA: Diagnosis not present

## 2018-06-21 DIAGNOSIS — I4891 Unspecified atrial fibrillation: Secondary | ICD-10-CM | POA: Diagnosis not present

## 2018-06-21 DIAGNOSIS — N289 Disorder of kidney and ureter, unspecified: Secondary | ICD-10-CM | POA: Insufficient documentation

## 2018-06-21 DIAGNOSIS — R824 Acetonuria: Secondary | ICD-10-CM | POA: Diagnosis not present

## 2018-06-21 DIAGNOSIS — I499 Cardiac arrhythmia, unspecified: Secondary | ICD-10-CM | POA: Diagnosis not present

## 2018-06-21 LAB — CBC
HEMATOCRIT: 36.8 % (ref 36.0–46.0)
HEMOGLOBIN: 11.1 g/dL — AB (ref 12.0–15.0)
MCH: 30.4 pg (ref 26.0–34.0)
MCHC: 30.2 g/dL (ref 30.0–36.0)
MCV: 100.8 fL — ABNORMAL HIGH (ref 80.0–100.0)
Platelets: 231 10*3/uL (ref 150–400)
RBC: 3.65 MIL/uL — ABNORMAL LOW (ref 3.87–5.11)
RDW: 15.4 % (ref 11.5–15.5)
WBC: 7.3 10*3/uL (ref 4.0–10.5)
nRBC: 0 % (ref 0.0–0.2)

## 2018-06-21 LAB — URINALYSIS, ROUTINE W REFLEX MICROSCOPIC
Bilirubin Urine: NEGATIVE
Glucose, UA: NEGATIVE mg/dL
Ketones, ur: 5 mg/dL — AB
Nitrite: NEGATIVE
Protein, ur: 30 mg/dL — AB
Specific Gravity, Urine: 1.026 (ref 1.005–1.030)
pH: 5 (ref 5.0–8.0)

## 2018-06-21 LAB — BASIC METABOLIC PANEL
Anion gap: 12 (ref 5–15)
BUN: 27 mg/dL — ABNORMAL HIGH (ref 8–23)
CO2: 23 mmol/L (ref 22–32)
Calcium: 9.3 mg/dL (ref 8.9–10.3)
Chloride: 106 mmol/L (ref 98–111)
Creatinine, Ser: 1.22 mg/dL — ABNORMAL HIGH (ref 0.44–1.00)
GFR calc Af Amer: 44 mL/min — ABNORMAL LOW (ref >60)
GFR calc non Af Amer: 38 mL/min — ABNORMAL LOW (ref >60)
Glucose, Bld: 112 mg/dL — ABNORMAL HIGH (ref 70–99)
POTASSIUM: 4.5 mmol/L (ref 3.5–5.1)
Sodium: 141 mmol/L (ref 135–145)

## 2018-06-21 LAB — I-STAT TROPONIN, ED: Troponin i, poc: 0.01 ng/mL (ref 0.00–0.08)

## 2018-06-21 LAB — MAGNESIUM: Magnesium: 2.4 mg/dL (ref 1.7–2.4)

## 2018-06-21 LAB — TSH: TSH: 3.696 u[IU]/mL (ref 0.350–4.500)

## 2018-06-21 LAB — BRAIN NATRIURETIC PEPTIDE: B Natriuretic Peptide: 534.6 pg/mL — ABNORMAL HIGH (ref 0.0–100.0)

## 2018-06-21 MED ORDER — APIXABAN 2.5 MG PO TABS
2.5000 mg | ORAL_TABLET | Freq: Two times a day (BID) | ORAL | Status: DC
  Administered 2018-06-21 – 2018-06-22 (×2): 2.5 mg via ORAL
  Filled 2018-06-21 (×4): qty 1

## 2018-06-21 MED ORDER — ONDANSETRON HCL 4 MG/2ML IJ SOLN: 4.0000 mg | Freq: Four times a day (QID) | INTRAMUSCULAR | Status: DC | PRN

## 2018-06-21 MED ORDER — ACETAMINOPHEN 325 MG PO TABS: 650.0000 mg | ORAL_TABLET | Freq: Four times a day (QID) | ORAL | Status: DC | PRN

## 2018-06-21 MED ORDER — DILTIAZEM HCL ER COATED BEADS 120 MG PO CP24
120.0000 mg | ORAL_CAPSULE | Freq: Every day | ORAL | Status: DC
  Administered 2018-06-22: 120 mg via ORAL
  Filled 2018-06-21: qty 1

## 2018-06-21 MED ORDER — MAGNESIUM SULFATE 2 GM/50ML IV SOLN: 2.0000 g | Freq: Once | INTRAVENOUS | Status: DC

## 2018-06-21 MED ORDER — FUROSEMIDE 10 MG/ML IJ SOLN: 20.0000 mg | Freq: Once | INTRAMUSCULAR | Status: DC

## 2018-06-21 MED ORDER — ONDANSETRON HCL 4 MG PO TABS: 4.0000 mg | ORAL_TABLET | Freq: Four times a day (QID) | ORAL | Status: DC | PRN

## 2018-06-21 MED ORDER — ACETAMINOPHEN 650 MG RE SUPP: 650.0000 mg | Freq: Four times a day (QID) | RECTAL | Status: DC | PRN

## 2018-06-21 MED ORDER — DILTIAZEM HCL-DEXTROSE 100-5 MG/100ML-% IV SOLN (PREMIX)
5.0000 mg/h | INTRAVENOUS | Status: DC
  Administered 2018-06-21: 5 mg/h via INTRAVENOUS
  Filled 2018-06-21: qty 100

## 2018-06-21 MED ORDER — DILTIAZEM HCL 60 MG PO TABS
30.0000 mg | ORAL_TABLET | Freq: Once | ORAL | Status: AC
  Administered 2018-06-21: 30 mg via ORAL
  Filled 2018-06-21 (×2): qty 1

## 2018-06-21 MED ORDER — DONEPEZIL HCL 10 MG PO TABS: 5.0000 mg | ORAL_TABLET | ORAL | Status: DC

## 2018-06-21 MED ORDER — DILTIAZEM HCL 25 MG/5ML IV SOLN
10.0000 mg | Freq: Once | INTRAVENOUS | Status: AC
  Administered 2018-06-21: 10 mg via INTRAVENOUS
  Filled 2018-06-21: qty 5

## 2018-06-21 NOTE — ED Triage Notes (Signed)
Pt here POV from PCP for new onset a fib, pt denies any complaints at this time. Pt alert, nad noted in triage.

## 2018-06-21 NOTE — ED Provider Notes (Signed)
Medical screening examination/treatment/procedure(s) were conducted as a shared visit with non-physician practitioner(s) and myself.  I personally evaluated the patient during the encounter. Briefly, the patient is a 84 y.o. female is a 84 year old female with history of Alzheimer's disease who presents to the ED with atrial fibrillation with rapid ventricular rate from office.  Patient with tachycardia upon arrival.  EKG shows atrial fibrillation with RVR.  Patient has felt generally weak for the last 10 days.  She has no history of A. fib.  She is not on blood thinner.  She denies any chest pain, shortness of breath.  She has swelling in her legs.  Clear breath sounds bilaterally.  Overall well-appearing.  Patient does live at an assisted living facility.  Given new onset atrial fibrillation patient started on diltiazem bolus and diltiazem drip.  Lab work was obtained.  There was no significant anemia, electrolyte abnormality, kidney injury.  BNP mildly elevated, troponin within normal limits.  Chest x-ray shows some signs of volume overload.  Patient with atrial fibrillation likely causing some heart failure.  Heart rate improved while on diltiazem.  Continues to be in atrial fibrillation.  Patient to be admitted to medicine service for further care.  This chart was dictated using voice recognition software.  Despite best efforts to proofread,  errors can occur which can change the documentation meaning.    EKG Interpretation  Date/Time:  Tuesday June 21 2018 15:15:36 EST Ventricular Rate:  109 PR Interval:    QRS Duration: 74 QT Interval:  336 QTC Calculation: 452 R Axis:   83 Text Interpretation:  Atrial fibrillation with rapid ventricular response Low voltage QRS Nonspecific ST abnormality , probably digitalis effect Abnormal ECG Confirmed by Lennice Sites [phone]) on 06/21/2018 3:59:07 PM           Lennice Sites, DO 06/21/18 1729

## 2018-06-21 NOTE — Consult Note (Signed)
Cardiology Consultation:   Patient ID: Nichole [name] MRN: [id number]; DOB: [birth year]  Admit date: 06/21/2018 Date of Consult: 06/21/2018  Primary Care Provider: Aretta Nip, MD Primary Cardiologist: Buford Dresser, MD (new)   Patient Profile:   Nichole [name] is a 84 y.o. female with a hx of Alzheiner's, prior TIA, recent hip fracture who is being seen today for the evaluation of new onset atrial fibrillation the request of Dr. Ronnald Nian.  History of Present Illness:   [name] is comfortable today but a difficult historian. Her daughter Nichole [name] is present on interview today. They note that she was incidentally found with a fast, irregular heartbeat today on routine medical exam. She has noted some mild LE edema and shortness of breath over the last week. Shortness of breath is primarily on exertion. No PND or orthopnea. No chest pain or palpitations. She does not feel differently when she is in atrial fibrillation compared to when she is not.   She endorses a prior history of stroke/TIA. No personal history of heart disease.  Family history notable for a father and brother with unclear heart disease.   Past Medical History:  Diagnosis Date  . Alzheimer disease ([place]) 08/25/2017  . Breast CA ([place])   . TIA (transient ischemic attack) 2009    Past Surgical History:  Procedure Laterality Date  . ANTERIOR APPROACH HEMI HIP ARTHROPLASTY Right 05/10/2018   Procedure: ANTERIOR APPROACH HEMI HIP ARTHROPLASTY;  Surgeon: Rod Can, MD;  Location: [facility];  Service: Orthopedics;  Laterality: Right;  . BREAST SURGERY    . MASTECTOMY Left 1995     Home Medications:  Prior to Admission medications   Medication Sig Start Date End Date Taking? Authorizing Provider  aspirin 81 MG tablet Take 1 tablet (81 mg total) by mouth 2 (two) times daily after a meal. Patient taking differently: Take 81 mg by mouth every 14 (fourteen) days.  05/11/18  Yes Swinteck,  Aaron Edelman, MD  donepezil (ARICEPT) 5 MG tablet Take 5 mg by mouth 2 (two) times a week.  04/23/18  Yes [provider]  traMADol (ULTRAM) 50 MG tablet Take 1 tablet (50 mg total) by mouth every 6 (six) hours as needed. Patient taking differently: Take 50 mg by mouth every 6 (six) hours as needed (for pain).  05/11/18  Yes Swinteck, Aaron Edelman, MD  docusate sodium (COLACE) 100 MG capsule Take 1 capsule (100 mg total) by mouth 2 (two) times daily. Patient not taking: Reported on 06/21/2018 05/12/18   Regalado, Jerald Kief A, MD  feeding supplement, ENSURE ENLIVE, (ENSURE ENLIVE) LIQD Take 237 mLs by mouth 2 (two) times daily between meals. Patient not taking: Reported on 06/21/2018 05/12/18   Regalado, Jerald Kief A, MD  polyethylene glycol (MIRALAX / GLYCOLAX) packet Take 17 g by mouth daily. Patient not taking: Reported on 06/21/2018 05/13/18   Regalado, Jerald Kief A, MD  senna (SENOKOT) 8.6 MG TABS tablet Take 1 tablet (8.6 mg total) by mouth 2 (two) times daily. Patient not taking: Reported on 06/21/2018 05/12/18   Niel Hummer A, MD    Inpatient Medications: Scheduled Meds: . apixaban  2.5 mg Oral BID   Continuous Infusions: . diltiazem (CARDIZEM) infusion 5 mg/hr (06/21/18 1641)   PRN Meds: acetaminophen **OR** acetaminophen, ondansetron **OR** ondansetron (ZOFRAN) IV  Allergies:    Allergies  Allergen Reactions  . Tape Other (See Comments)    SKIN IS VERY THIN, SO IT WILL TEAR AND BRUISE EASILY!!    Social History:   Social History  Socioeconomic History  . Marital status: Widowed    Spouse name: Not on file  . Number of children: 2  . Years of education: 26  . Highest education level: Not on file  Occupational History  . Occupation: Retired  Scientific laboratory technician  . Financial resource strain: Not on file  . Food insecurity:    Worry: Not on file    Inability: Not on file  . Transportation needs:    Medical: Not on file    Non-medical: Not on file  Tobacco Use  . Smoking status:  Never Smoker  . Smokeless tobacco: Never Used  Substance and Sexual Activity  . Alcohol use: No  . Drug use: No  . Sexual activity: Never    Birth control/protection: None  Lifestyle  . Physical activity:    Days per week: Not on file    Minutes per session: Not on file  . Stress: Not on file  Relationships  . Social connections:    Talks on phone: Not on file    Gets together: Not on file    Attends religious service: Not on file    Active member of club or organization: Not on file    Attends meetings of clubs or organizations: Not on file    Relationship status: Not on file  . Intimate partner violence:    Fear of current or ex partner: Not on file    Emotionally abused: Not on file    Physically abused: Not on file    Forced sexual activity: Not on file  Other Topics Concern  . Not on file  Social History Narrative   Granddaughter live with her   Caffeine use: 1 serving per day, 1 coke per day   Right handed     Family History:    Family History  Problem Relation Age of Onset  . Ovarian cancer Sister   . Breast cancer Sister   . Colon cancer Mother   . AAA (abdominal aortic aneurysm) Mother   . Coronary artery disease Father      ROS:  Please see the history of present illness.  Review of Systems  Constitutional: Negative for chills and fever.  HENT: Negative for congestion and ear pain.   Eyes: Negative for double vision and pain.  Respiratory: Negative for cough, hemoptysis, shortness of breath and wheezing.   Cardiovascular: Positive for leg swelling. Negative for chest pain, palpitations, orthopnea, claudication and PND.  Gastrointestinal: Negative for abdominal pain, blood in stool, melena and vomiting.  Genitourinary: Negative for dysuria and hematuria.  Musculoskeletal: Positive for falls. Negative for myalgias.       Recent fall in October resulting in fracture, none since  Skin: Negative for rash.  Neurological: Negative for sensory change, speech  change and loss of consciousness.  Endo/Heme/Allergies: Does not bruise/bleed easily.   All other ROS reviewed and negative.     Physical Exam/Data:   Vitals:   06/21/18 1615 06/21/18 1630 06/21/18 1645 06/21/18 1700  BP: (!) 160/92 131/86 140/83 (!) 148/91  Pulse: (!) 109 86 92 94  Resp: (!) 21 17 (!) 21 19  Temp:      TempSrc:      SpO2: 97% 95% 95% 94%   No intake or output data in the 24 hours ending 06/21/18 1755 There were no vitals filed for this visit. There is no height or weight on file to calculate BMI.  General:  Well nourished, well developed, in no acute distress HEENT: normal  Lymph: no adenopathy Neck: no JVD Endocrine:  No thryomegaly Vascular: No carotid bruits; radial pulses 2+ bilaterally Cardiac:  Irregularly irregular S1, S2; RRR; 1/6 systolic murmur Lungs:  clear to auscultation bilaterally, no wheezing, rhonchi or rales  Abd: soft, nontender, no hepatomegaly  Ext: bilateral trace ankle edema Musculoskeletal:  No deformities Skin: warm and dry  Neuro:  Grossly intact Psych:  Normal affect   EKG:  The EKG was personally reviewed and demonstrates:  Atrial fibrillation Telemetry:  Telemetry was personally reviewed and demonstrates:  Atrial fibrillation with improving rate control  Relevant CV Studies: none  Laboratory Data:  Chemistry Recent Labs  Lab 06/21/18 1525  NA 141  K 4.5  CL 106  CO2 23  GLUCOSE 112*  BUN 27*  CREATININE 1.22*  CALCIUM 9.3  GFRNONAA 38*  GFRAA 44*  ANIONGAP 12    No results for input(s): PROT, ALBUMIN, AST, ALT, ALKPHOS, BILITOT in the last 168 hours. Hematology Recent Labs  Lab 06/21/18 1525  WBC 7.3  RBC 3.65*  HGB 11.1*  HCT 36.8  MCV 100.8*  MCH 30.4  MCHC 30.2  RDW 15.4  PLT 231   Cardiac EnzymesNo results for input(s): TROPONINI in the last 168 hours.  Recent Labs  Lab 06/21/18 1643  TROPIPOC 0.01    BNP Recent Labs  Lab 06/21/18 1639  BNP 534.6*    DDimer No results for input(s):  DDIMER in the last 168 hours.  Radiology/Studies:  Dg Chest Port 1 View  Result Date: 06/21/2018 CLINICAL DATA:  New onset atrial fibrillation.  Shortness of breath. EXAM: PORTABLE CHEST 1 VIEW COMPARISON:  None. FINDINGS: Small bilateral pleural effusions with underlying opacities identified. Cardiomegaly. The hila and mediastinum are unremarkable. No overt edema. IMPRESSION: Bilateral pleural effusions with underlying opacities, likely atelectasis. No overt edema. Electronically Signed   By: Dorise Bullion III M.D   On: 06/21/2018 16:52    Assessment and Plan:   1. New onset atrial fibrillation: CHADSVASC=5. Being rate controlled now with diltiazem drip.   -can consolidate to oral diltiazem, would do 120 mg daily dose. Could do 30 mg overnight and then 120 mg long acting in the AM. If her EF is reduced, would need to change to metoprolol.  -ok to start apixaban 2.5 mg BID   We discussed risks of atrial fibrillation, including risk of stroke. We also discussed the risk and benefit of anticoagulation. Patient and her daughter are amenable to trialing anticoagulation. With her age, and her last weight near 60 kg, will start her at reduced dose   It is a complicated decision given her age, but she denies recent falls other than the one that caused her recent hip fracture. She is at a high risk for stroke if left untreated.   2. Lower extremity edema: this may be due to tachycardiac driven cardiac dysfunction. Would get echo tomorrow to evaluate for systolic or diastolic dysfunction.  We will continue to follow with you.  Buford Dresser, MD, PhD [facility]   For questions or updates, please contact [place] Please consult [email or web address] for contact info under   Signed, Buford Dresser, MD  06/21/2018 5:55 PM

## 2018-06-21 NOTE — ED Provider Notes (Signed)
[facility] EMERGENCY DEPARTMENT Provider Note   CSN: [id number] Arrival date & time: 06/21/18  1500     History   Chief Complaint Chief Complaint  Patient presents with  . Irregular Heart Beat    HPI Nichole [name] is a 84 y.o. female. With PMH/o Alzheimer, TIA who presents for evaluation of irregular heartbeat.  Patient was seen at [facility] today and was told to go to the emergency department for further evaluation for irregular and fast heartbeat.  Patient reports that she recently had a hip surgery done on May 06, 2017.  During her hospitalization, they noted some irregular heartbeat.  Patient was discharged home and lives in assisted living facility.  She does report over the last week or so, she has had some shortness of breath and is felt more fatigued than usual.  She states that when she walks around, the shortness of breath gets worse but she also does endorse some shortness of breath while just sitting still.  Patient went to follow-up with her primary care doctor today and was noted to be in A. fib with RVR.  Patient with no prior history of A. fib.  She was sent to the ED for further evaluation.  Patient denies any chest pain.  She had symptoms of swelling of the right leg which is the hip she had the surgery on initially after her operation.  While at home, she started developing bilateral lower extremity swelling.  No overlying warmth, erythema.  Patient denies any nausea, vomiting, abdominal pain.  The history is provided by the patient.    Past Medical History:  Diagnosis Date  . Alzheimer disease ([place]) 08/25/2017  . Breast CA ([place])   . TIA (transient ischemic attack) 2009    Patient Active Problem List   Diagnosis Date Noted  . Atrial fibrillation with RVR ([place]) 06/21/2018  . Anemia 06/21/2018  . Renal insufficiency 06/21/2018  . Fall at home, initial encounter 05/12/2018  . Adjustment disorder with mixed disturbance of  emotions and conduct   . Closed displaced fracture of right femoral neck ([place]) 05/10/2018  . Hip fracture ([place]) 05/09/2018  . Alzheimer disease ([place]) 08/25/2017  . SIADH (syndrome of inappropriate ADH production) ([place])   . HTN (hypertension) 11/16/2015  . Hyponatremia 11/13/2015    Past Surgical History:  Procedure Laterality Date  . ANTERIOR APPROACH HEMI HIP ARTHROPLASTY Right 05/10/2018   Procedure: ANTERIOR APPROACH HEMI HIP ARTHROPLASTY;  Surgeon: Rod Can, MD;  Location: [facility];  Service: Orthopedics;  Laterality: Right;  . BREAST SURGERY    . MASTECTOMY Left 1995     OB History   No obstetric history on file.      Home Medications    Prior to Admission medications   Medication Sig Start Date End Date Taking? Authorizing Provider  aspirin 81 MG tablet Take 1 tablet (81 mg total) by mouth 2 (two) times daily after a meal. Patient taking differently: Take 81 mg by mouth every 14 (fourteen) days.  05/11/18  Yes Swinteck, Aaron Edelman, MD  donepezil (ARICEPT) 5 MG tablet Take 5 mg by mouth 2 (two) times a week.  04/23/18  Yes [provider]  traMADol (ULTRAM) 50 MG tablet Take 1 tablet (50 mg total) by mouth every 6 (six) hours as needed. Patient taking differently: Take 50 mg by mouth every 6 (six) hours as needed (for pain).  05/11/18  Yes Swinteck, Aaron Edelman, MD  docusate sodium (COLACE) 100 MG capsule Take 1 capsule (  100 mg total) by mouth 2 (two) times daily. Patient not taking: Reported on 06/21/2018 05/12/18   Regalado, Jerald Kief A, MD  feeding supplement, ENSURE ENLIVE, (ENSURE ENLIVE) LIQD Take 237 mLs by mouth 2 (two) times daily between meals. Patient not taking: Reported on 06/21/2018 05/12/18   Regalado, Jerald Kief A, MD  polyethylene glycol (MIRALAX / GLYCOLAX) packet Take 17 g by mouth daily. Patient not taking: Reported on 06/21/2018 05/13/18   Regalado, Jerald Kief A, MD  senna (SENOKOT) 8.6 MG TABS tablet Take 1 tablet (8.6 mg total) by mouth 2 (two) times  daily. Patient not taking: Reported on 06/21/2018 05/12/18   Elmarie Shiley, MD    Family History Family History  Problem Relation Age of Onset  . Ovarian cancer Sister   . Breast cancer Sister   . Colon cancer Mother   . AAA (abdominal aortic aneurysm) Mother   . Coronary artery disease Father     Social History Social History   Tobacco Use  . Smoking status: Never Smoker  . Smokeless tobacco: Never Used  Substance Use Topics  . Alcohol use: No  . Drug use: No     Allergies   Tape   Review of Systems Review of Systems  Constitutional: Positive for fatigue. Negative for fever.  Respiratory: Positive for shortness of breath. Negative for cough.   Cardiovascular: Positive for leg swelling. Negative for chest pain.  Gastrointestinal: Negative for abdominal pain, nausea and vomiting.  Genitourinary: Negative for dysuria and hematuria.  Skin: Negative for color change.  Neurological: Negative for weakness and headaches.  All other systems reviewed and are negative.    Physical Exam Updated Vital Signs BP (!) 146/87   Pulse 91   Temp 97.7 F (36.5 C) (Oral)   Resp (!) 23   SpO2 97%   Physical Exam Vitals signs and nursing note reviewed.  Constitutional:      Appearance: Normal appearance. She is well-developed.     Comments: Elderly appearing  HENT:     Head: Normocephalic and atraumatic.  Eyes:     General: Lids are normal.     Conjunctiva/sclera: Conjunctivae normal.     Pupils: Pupils are equal, round, and reactive to light.  Neck:     Musculoskeletal: Full passive range of motion without pain.  Cardiovascular:     Rate and Rhythm: Tachycardia present. Rhythm irregularly irregular.     Pulses: Normal pulses.          Radial pulses are 2+ on the right side and 2+ on the left side.       Dorsalis pedis pulses are 2+ on the right side and 2+ on the left side.     Heart sounds: Normal heart sounds. No murmur. No friction rub. No gallop.   Pulmonary:      Effort: Pulmonary effort is normal.     Breath sounds: Normal breath sounds.     Comments: Lungs clear to auscultation bilaterally.  Symmetric chest rise.  No wheezing, rales, rhonchi. Abdominal:     Palpations: Abdomen is soft. Abdomen is not rigid.     Tenderness: There is no abdominal tenderness. There is no guarding.     Comments: Abdomen is soft, non-distended, non-tender. No rigidity, No guarding. No peritoneal signs.  Musculoskeletal: Normal range of motion.     Comments: 1+ pitting edema noted to BLE without overlying warmth, erythema.   Skin:    General: Skin is warm and dry.     Capillary Refill: Capillary  refill takes less than 2 seconds.  Neurological:     Mental Status: She is alert and oriented to person, place, and time.  Psychiatric:        Speech: Speech normal.      ED Treatments / Results  Labs (all labs ordered are listed, but only abnormal results are displayed) Labs Reviewed  BASIC METABOLIC PANEL - Abnormal; Notable for the following components:      Result Value   Glucose, Bld 112 (*)    BUN 27 (*)    Creatinine, Ser 1.22 (*)    GFR calc non Af Amer 38 (*)    GFR calc Af Amer 44 (*)    All other components within normal limits  CBC - Abnormal; Notable for the following components:   RBC 3.65 (*)    Hemoglobin 11.1 (*)    MCV 100.8 (*)    All other components within normal limits  BRAIN NATRIURETIC PEPTIDE - Abnormal; Notable for the following components:   B Natriuretic Peptide 534.6 (*)    All other components within normal limits  URINALYSIS, ROUTINE W REFLEX MICROSCOPIC - Abnormal; Notable for the following components:   APPearance HAZY (*)    Hgb urine dipstick MODERATE (*)    Ketones, ur 5 (*)    Protein, ur 30 (*)    Leukocytes, UA SMALL (*)    Bacteria, UA FEW (*)    All other components within normal limits  TSH  CBC WITH DIFFERENTIAL/PLATELET  COMPREHENSIVE METABOLIC PANEL  TROPONIN I  TROPONIN I  MAGNESIUM  I-STAT TROPONIN,  ED    EKG EKG Interpretation  Date/Time:  Tuesday June 21 2018 15:15:36 EST Ventricular Rate:  109 PR Interval:    QRS Duration: 74 QT Interval:  336 QTC Calculation: 452 R Axis:   83 Text Interpretation:  Atrial fibrillation with rapid ventricular response Low voltage QRS Nonspecific ST abnormality , probably digitalis effect Abnormal ECG Confirmed by Lennice Sites [phone]) on 06/21/2018 3:59:07 PM   Radiology Dg Chest Port 1 View  Result Date: 06/21/2018 CLINICAL DATA:  New onset atrial fibrillation.  Shortness of breath. EXAM: PORTABLE CHEST 1 VIEW COMPARISON:  None. FINDINGS: Small bilateral pleural effusions with underlying opacities identified. Cardiomegaly. The hila and mediastinum are unremarkable. No overt edema. IMPRESSION: Bilateral pleural effusions with underlying opacities, likely atelectasis. No overt edema. Electronically Signed   By: Dorise Bullion III M.D   On: 06/21/2018 16:52    Procedures .Critical Care Performed by: Volanda Napoleon, PA-C Authorized by: Volanda Napoleon, PA-C   Critical care provider statement:    Critical care time (minutes):  45   Critical care was necessary to treat or prevent imminent or life-threatening deterioration of the following conditions:  Cardiac failure, circulatory failure and respiratory failure   Critical care was time spent personally by me on the following activities:  Discussions with consultants, evaluation of patient's response to treatment, examination of patient, ordering and performing treatments and interventions, ordering and review of laboratory studies, ordering and review of radiographic studies, pulse oximetry, re-evaluation of patient's condition, obtaining history from patient or surrogate and review of old charts   (including critical care time)  Medications Ordered in ED Medications  diltiazem (CARDIZEM) 100 mg in dextrose 5% 11mL (1 mg/mL) infusion (5 mg/hr Intravenous New Bag/Given 06/21/18 1641)   apixaban (ELIQUIS) tablet 2.5 mg (2.5 mg Oral Given 06/21/18 1832)  ondansetron (ZOFRAN) tablet 4 mg (has no administration in time range)    Or  ondansetron (ZOFRAN) injection 4 mg (has no administration in time range)  acetaminophen (TYLENOL) tablet 650 mg (has no administration in time range)    Or  acetaminophen (TYLENOL) suppository 650 mg (has no administration in time range)  diltiazem (CARDIZEM) tablet 30 mg (has no administration in time range)  diltiazem (CARDIZEM) injection 10 mg (10 mg Intravenous Given 06/21/18 1614)     Initial Impression / Assessment and Plan / ED Course  I have reviewed the triage vital signs and the nursing notes.  Pertinent labs & imaging results that were available during my care of the patient were reviewed by me and considered in my medical decision making (see chart for details).     84 year old female who presents for new onset A. fib with RVR.  Seen at PCPs office earlier today and noted to be in A. fib with no prior history and was sent to the emergency department further evaluation.  Does endorse some shortness of breath and fatigue over the last week or so.  Currently lives at assisted living facility.  Also has had some bilateral lower extremity edema.  No chest pain.  Initial ED arrival, patient is afebrile but is tacky with heart rate ranging between 120-150s.  EKG concerning for A. fib with RVR.  We will plan for diltiazem bolus and diltiazem drip.  Will likely need admission for work-up of new onset A. Fib.   Discussed patient with Dr. Harrell Gave (Cards). Agrees with plan for dilt drip. Will consult during admission.   BMP shows BUN of 27, creatinine of 1.22.  CBC without any significant leukocytosis.  Hemoglobin 11.1.  UA shows moderate hemoglobin, small amount of leukocytes.  There is some pyuria.  Given new onset A. fib, will need admission for work-up.  Discussed with Dr. Olevia Bowens (hospitalist).  Will accept patient for  admission.  Discussed patient with Dr. Harrell Gave (Cards) after evaluation of patient in the ED. Recommends starting patient on Eliquis. Plan for ECHO tomorrow.   Final Clinical Impressions(s) / ED Diagnoses   Final diagnoses:  Atrial fibrillation with RVR [facility])    ED Discharge Orders    None       Volanda Napoleon, PA-C 06/21/18 [place], [place], DO 06/22/18 0045

## 2018-06-21 NOTE — ED Notes (Signed)
Patient transported to CT 

## 2018-06-21 NOTE — H&P (Signed)
History and Physical    Nichole [name] EYC:[id number] DOB: October 17, 1922 DOA: 06/21/2018  PCP: Aretta Nip, MD   Patient coming from: Home.  I have personally briefly reviewed patient's old medical records in [place]  Chief Complaint: Weakness.  HPI: Nichole [name] is a 84 y.o. female with medical history significant of Alzheimer's disease, breast cancer, TIA, B12 deficiency, recent right hip fracture and right hip arthroplasty about 6 weeks ago who is coming to the emergency department after incidentally she was found to have a fast and irregular heart rhythm on routine medical exam by her PCP.  She has also have lower extremity edema, dyspnea on exertion, increased confusion over the last few days.  There has not having any chest pain, palpitations, dizziness, PND or orthopnea that her daughter has noticed.  Her appetite has been less than usual.  No fever, sore throat, wheezing, hemoptysis, nausea, emesis, constipation, diarrhea, melena or hematochezia.  No dysuria, frequency or hematuria.  ED Course: Initial vital signs temperature 97.7 F, pulse 103, respirations 16, blood pressure 160/87 mmHg and O2 sat 96% on room air.  The patient was given 10 mg Cardizem IVP bolus and then was started on a Cardizem continuous infusion.  Her urinalysis show moderate hemoglobinuria, proteinuria 30 and ketonuria 5 mg/dL.  There was small leukocyte esterase.  Microscopic exam shows few bacteria with 6-10 RBC and 11-20 WBC per hpf.  CBC shows a white count of 7.3, hemoglobin 11.1 g/dL and platelets 131.  BMP shows normal electrolytes.  BUN was 27, creatinine 1.22 and glucose 112 mg/dL. Chest radiograph shows cardiomegaly, bilateral pleural effusions with underlying opacities, likely atelectasis.  There was no overt edema.  Review of Systems: History was provided by her daughter..   Past Medical History:  Diagnosis Date  . Alzheimer disease ([place]) 08/25/2017  . Breast CA ([place])   . TIA  (transient ischemic attack) 2009    Past Surgical History:  Procedure Laterality Date  . ANTERIOR APPROACH HEMI HIP ARTHROPLASTY Right 05/10/2018   Procedure: ANTERIOR APPROACH HEMI HIP ARTHROPLASTY;  Surgeon: Rod Can, MD;  Location: [facility];  Service: Orthopedics;  Laterality: Right;  . BREAST SURGERY    . MASTECTOMY Left 1995     reports that she has never smoked. She has never used smokeless tobacco. She reports that she does not drink alcohol or use drugs.  Allergies  Allergen Reactions  . Tape Other (See Comments)    SKIN IS VERY THIN, SO IT WILL TEAR AND BRUISE EASILY!!    Family History  Problem Relation Age of Onset  . Ovarian cancer Sister   . Breast cancer Sister   . Colon cancer Mother   . AAA (abdominal aortic aneurysm) Mother   . Coronary artery disease Father    Prior to Admission medications   Medication Sig Start Date End Date Taking? Authorizing Provider  aspirin 81 MG tablet Take 1 tablet (81 mg total) by mouth 2 (two) times daily after a meal. Patient taking differently: Take 81 mg by mouth every 14 (fourteen) days.  05/11/18  Yes Swinteck, Aaron Edelman, MD  donepezil (ARICEPT) 5 MG tablet Take 5 mg by mouth 2 (two) times a week.  04/23/18  Yes [provider]  traMADol (ULTRAM) 50 MG tablet Take 1 tablet (50 mg total) by mouth every 6 (six) hours as needed. Patient taking differently: Take 50 mg by mouth every 6 (six) hours as needed (for pain).  05/11/18  Yes Swinteck, Aaron Edelman, MD  docusate  sodium (COLACE) 100 MG capsule Take 1 capsule (100 mg total) by mouth 2 (two) times daily. Patient not taking: Reported on 06/21/2018 05/12/18   Regalado, Jerald Kief A, MD  feeding supplement, ENSURE ENLIVE, (ENSURE ENLIVE) LIQD Take 237 mLs by mouth 2 (two) times daily between meals. Patient not taking: Reported on 06/21/2018 05/12/18   Regalado, Jerald Kief A, MD  polyethylene glycol (MIRALAX / GLYCOLAX) packet Take 17 g by mouth daily. Patient not taking: Reported on  06/21/2018 05/13/18   Regalado, Jerald Kief A, MD  senna (SENOKOT) 8.6 MG TABS tablet Take 1 tablet (8.6 mg total) by mouth 2 (two) times daily. Patient not taking: Reported on 06/21/2018 05/12/18   Elmarie Shiley, MD    Physical Exam: Vitals:   06/21/18 1615 06/21/18 1630 06/21/18 1645 06/21/18 1700  BP: (!) 160/92 131/86 140/83 (!) 148/91  Pulse: (!) 109 86 92 94  Resp: (!) 21 17 (!) 21 19  Temp:      TempSrc:      SpO2: 97% 95% 95% 94%    Constitutional: Frail elderly female, but in NAD, calm, comfortable Eyes: PERRL, lids and conjunctivae normal ENMT: Mucous membranes are moist. Posterior pharynx clear of any exudate or lesions. Neck: normal, supple, no masses, no thyromegaly Respiratory: clear to auscultation bilaterally, no wheezing, no crackles. Normal respiratory effort. No accessory muscle use.  Cardiovascular: Tachycardic at 105 bpm with an irregularly irregular rhythm, no murmurs / rubs / gallops.  2+ lower extremities pitting edema, left slightly more than right. 2+ pedal pulses. No carotid bruits.  Abdomen: Soft, no tenderness, no masses palpated. No hepatosplenomegaly. Bowel sounds positive.  Musculoskeletal: no clubbing / cyanosis. Good ROM, no contractures. Normal muscle tone.  Skin: Mild erythema on both feet. Neurologic: CN 2-12 grossly intact. Sensation intact, DTR normal. Strength 5/5 in all 4.  Psychiatric: Alert and oriented x 1, partially oriented to place and situation, disoriented to date.  Knows who is the president.  Labs on Admission: I have personally reviewed following labs and imaging studies  CBC: Recent Labs  Lab 06/21/18 1525  WBC 7.3  HGB 11.1*  HCT 36.8  MCV 100.8*  PLT 332   Basic Metabolic Panel: Recent Labs  Lab 06/21/18 1525  NA 141  K 4.5  CL 106  CO2 23  GLUCOSE 112*  BUN 27*  CREATININE 1.22*  CALCIUM 9.3   GFR: CrCl cannot be calculated (Unknown ideal weight.). Liver Function Tests: No results for input(s): AST, ALT,  ALKPHOS, BILITOT, PROT, ALBUMIN in the last 168 hours. No results for input(s): LIPASE, AMYLASE in the last 168 hours. No results for input(s): AMMONIA in the last 168 hours. Coagulation Profile: No results for input(s): INR, PROTIME in the last 168 hours. Cardiac Enzymes: No results for input(s): CKTOTAL, CKMB, CKMBINDEX, TROPONINI in the last 168 hours. BNP (last 3 results) No results for input(s): PROBNP in the last 8760 hours. HbA1C: No results for input(s): HGBA1C in the last 72 hours. CBG: No results for input(s): GLUCAP in the last 168 hours. Lipid Profile: No results for input(s): CHOL, HDL, LDLCALC, TRIG, CHOLHDL, LDLDIRECT in the last 72 hours. Thyroid Function Tests: Recent Labs    06/21/18 1639  TSH 3.696   Anemia Panel: No results for input(s): VITAMINB12, FOLATE, FERRITIN, TIBC, IRON, RETICCTPCT in the last 72 hours. Urine analysis:    Component Value Date/Time   COLORURINE YELLOW 06/21/2018 1616   APPEARANCEUR HAZY (A) 06/21/2018 1616   LABSPEC 1.026 06/21/2018 1616   PHURINE 5.0  06/21/2018 1616   GLUCOSEU NEGATIVE 06/21/2018 1616   HGBUR MODERATE (A) 06/21/2018 1616   BILIRUBINUR NEGATIVE 06/21/2018 1616   KETONESUR 5 (A) 06/21/2018 1616   PROTEINUR 30 (A) 06/21/2018 1616   NITRITE NEGATIVE 06/21/2018 1616   LEUKOCYTESUR SMALL (A) 06/21/2018 1616    Radiological Exams on Admission: Dg Chest Port 1 View  Result Date: 06/21/2018 CLINICAL DATA:  New onset atrial fibrillation.  Shortness of breath. EXAM: PORTABLE CHEST 1 VIEW COMPARISON:  None. FINDINGS: Small bilateral pleural effusions with underlying opacities identified. Cardiomegaly. The hila and mediastinum are unremarkable. No overt edema. IMPRESSION: Bilateral pleural effusions with underlying opacities, likely atelectasis. No overt edema. Electronically Signed   By: Dorise Bullion III M.D   On: 06/21/2018 16:52    EKG: Independently reviewed.  Vent. rate 109 BPM PR interval * ms QRS duration 74  ms QT/QTc 336/452 ms P-R-T axes * 83 25 Atrial fibrillation with rapid ventricular response Low voltage QRS Nonspecific ST abnormality , probably digitalis effect Abnormal ECG  Assessment/Plan Principal Problem:   Atrial fibrillation with RVR (HCC) CHA?DS?-VASc Score of at least 5. Continue Cardizem infusion. Oral diltiazem Per cardiology recommendation. On apixaban 2.5 mg p.o. twice daily. Trend troponin levels. Check echocardiogram.  Active Problems:   HTN (hypertension) Continue Cardizem infusion. Cardizem 30 mg p.o. x1 dose at bedtime. Start Cardizem CD 120 mg p.o. daily tomorrow morning.    Alzheimer disease [facility]) Per daughter, she has been declining rapidly lately. TSH was normal. B12 level was recently low. Check CT of head and RPR level.    Anemia Check anemia panel.   Bilateral lower extremity edema Check echocardiogram. Low-dose furosemide.   DVT prophylaxis: Apixaban. Code Status: DNR. Family Communication: Her daughter was present in the ED room. Disposition Plan: Observation for new onset A. fib work-up and treatment. Consults called: Cardiology Buford Dresser, MD). Admission status: Observation/progressive unit.   Reubin Milan MD [facility]   If 7PM-7AM, please contact night-coverage [email or web address] Password [facility]  06/21/2018, 5:56 PM

## 2018-06-22 ENCOUNTER — Observation Stay (HOSPITAL_BASED_OUTPATIENT_CLINIC_OR_DEPARTMENT_OTHER): Payer: Medicare Other

## 2018-06-22 ENCOUNTER — Telehealth: Payer: Self-pay | Admitting: Cardiology

## 2018-06-22 DIAGNOSIS — Z96651 Presence of right artificial knee joint: Secondary | ICD-10-CM | POA: Diagnosis not present

## 2018-06-22 DIAGNOSIS — I361 Nonrheumatic tricuspid (valve) insufficiency: Secondary | ICD-10-CM

## 2018-06-22 DIAGNOSIS — I340 Nonrheumatic mitral (valve) insufficiency: Secondary | ICD-10-CM | POA: Diagnosis not present

## 2018-06-22 DIAGNOSIS — I4891 Unspecified atrial fibrillation: Secondary | ICD-10-CM | POA: Diagnosis not present

## 2018-06-22 DIAGNOSIS — R600 Localized edema: Secondary | ICD-10-CM | POA: Diagnosis not present

## 2018-06-22 DIAGNOSIS — G309 Alzheimer's disease, unspecified: Secondary | ICD-10-CM | POA: Diagnosis not present

## 2018-06-22 DIAGNOSIS — S72011D Unspecified intracapsular fracture of right femur, subsequent encounter for closed fracture with routine healing: Secondary | ICD-10-CM | POA: Diagnosis not present

## 2018-06-22 DIAGNOSIS — F4325 Adjustment disorder with mixed disturbance of emotions and conduct: Secondary | ICD-10-CM | POA: Diagnosis not present

## 2018-06-22 DIAGNOSIS — F0280 Dementia in other diseases classified elsewhere without behavioral disturbance: Secondary | ICD-10-CM | POA: Diagnosis not present

## 2018-06-22 DIAGNOSIS — D500 Iron deficiency anemia secondary to blood loss (chronic): Secondary | ICD-10-CM | POA: Diagnosis not present

## 2018-06-22 LAB — CBC WITH DIFFERENTIAL/PLATELET
Abs Immature Granulocytes: 0.02 10*3/uL (ref 0.00–0.07)
Basophils Absolute: 0 10*3/uL (ref 0.0–0.1)
Basophils Relative: 0 %
Eosinophils Absolute: 0.1 10*3/uL (ref 0.0–0.5)
Eosinophils Relative: 1 %
HCT: 33.6 % — ABNORMAL LOW (ref 36.0–46.0)
Hemoglobin: 10.6 g/dL — ABNORMAL LOW (ref 12.0–15.0)
Immature Granulocytes: 0 %
Lymphocytes Relative: 14 %
Lymphs Abs: 1.1 10*3/uL (ref 0.7–4.0)
MCH: 31.2 pg (ref 26.0–34.0)
MCHC: 31.5 g/dL (ref 30.0–36.0)
MCV: 98.8 fL (ref 80.0–100.0)
Monocytes Absolute: 0.7 10*3/uL (ref 0.1–1.0)
Monocytes Relative: 9 %
NRBC: 0 % (ref 0.0–0.2)
Neutro Abs: 6 10*3/uL (ref 1.7–7.7)
Neutrophils Relative %: 76 %
Platelets: 247 10*3/uL (ref 150–400)
RBC: 3.4 MIL/uL — AB (ref 3.87–5.11)
RDW: 15.3 % (ref 11.5–15.5)
WBC: 8 10*3/uL (ref 4.0–10.5)

## 2018-06-22 LAB — RETICULOCYTES
Immature Retic Fract: 19.6 % — ABNORMAL HIGH (ref 2.3–15.9)
RBC.: 3.4 MIL/uL — ABNORMAL LOW (ref 3.87–5.11)
Retic Count, Absolute: 77.2 10*3/uL (ref 19.0–186.0)
Retic Ct Pct: 2.3 % (ref 0.4–3.1)

## 2018-06-22 LAB — IRON AND TIBC
Iron: 54 ug/dL (ref 28–170)
Saturation Ratios: 18 % (ref 10.4–31.8)
TIBC: 294 ug/dL (ref 250–450)
UIBC: 240 ug/dL

## 2018-06-22 LAB — COMPREHENSIVE METABOLIC PANEL
ALT: 21 U/L (ref 0–44)
AST: 25 U/L (ref 15–41)
Albumin: 3.1 g/dL — ABNORMAL LOW (ref 3.5–5.0)
Alkaline Phosphatase: 100 U/L (ref 38–126)
Anion gap: 11 (ref 5–15)
BUN: 26 mg/dL — ABNORMAL HIGH (ref 8–23)
CALCIUM: 8.9 mg/dL (ref 8.9–10.3)
CO2: 24 mmol/L (ref 22–32)
Chloride: 105 mmol/L (ref 98–111)
Creatinine, Ser: 1.13 mg/dL — ABNORMAL HIGH (ref 0.44–1.00)
GFR calc Af Amer: 49 mL/min — ABNORMAL LOW (ref >60)
GFR calc non Af Amer: 42 mL/min — ABNORMAL LOW (ref >60)
Glucose, Bld: 94 mg/dL (ref 70–99)
Potassium: 4 mmol/L (ref 3.5–5.1)
Sodium: 140 mmol/L (ref 135–145)
Total Bilirubin: 1.3 mg/dL — ABNORMAL HIGH (ref 0.3–1.2)
Total Protein: 6.2 g/dL — ABNORMAL LOW (ref 6.5–8.1)

## 2018-06-22 LAB — TROPONIN I
Troponin I: <0.03 ng/mL (ref <0.03)
Troponin I: 0.03 ng/mL (ref <0.03)

## 2018-06-22 LAB — MRSA PCR SCREENING: MRSA by PCR: NEGATIVE

## 2018-06-22 LAB — FOLATE: Folate: 15.2 ng/mL (ref >5.9)

## 2018-06-22 LAB — VITAMIN B12: VITAMIN B 12: 676 pg/mL (ref 180–914)

## 2018-06-22 LAB — FERRITIN: Ferritin: 84 ng/mL (ref 11–307)

## 2018-06-22 LAB — ECHOCARDIOGRAM COMPLETE

## 2018-06-22 MED ORDER — DILTIAZEM HCL ER COATED BEADS 120 MG PO CP24: 120.0000 mg | ORAL_CAPSULE | Freq: Every day | ORAL | 1 refills | Status: DC

## 2018-06-22 MED ORDER — APIXABAN 2.5 MG PO TABS: 2.5000 mg | ORAL_TABLET | Freq: Two times a day (BID) | ORAL | 1 refills | Status: DC

## 2018-06-22 NOTE — Care Management Note (Addendum)
Case Management Note  Patient Details  Name: Nichole [name] MRN: [id number] Date of Birth: 10/25/1922  Subjective/Objective:    Pt admitted with A fib              Action/Plan:   PTA from Independent living at [facility] facility.  Pt has been deemed stable for discharge back to facility.  Pts daughter will transport pt back to facility today.  Pt will discharge home on Eliquis - CM provided free 30 day card.  CM contacted pharmacy of choice - both discharge prescriptions can be filled today without PA - ongoing copay for Eliquis will be $40.  Pt has HHPT, OT, speech therapy and aide by [facility].  CM requested resumption orders from attending and left voicemail for [facility] to make aware that pt is admitted and will discharge today   Expected Discharge Date:  06/22/18               Expected Discharge Plan:  Home/Self Care  In-House Referral:  Clinical Social Work  Discharge planning Services  CM Consult, Medication Assistance  Post Acute Care Choice:    Choice offered to:  Patient, Adult Children  DME Arranged:    DME Agency:     HH Arranged:  PT, OT, Nurse's Aide, Speech Therapy [place] Agency:  [facility] (formerly [employer])  Status of Service:  Completed, signed off  If discussed at [employer], dates discussed:    Additional Comments: [facility] called CM back - will resume services  Maryclare Labrador, RN 06/22/2018, 3:20 PM

## 2018-06-22 NOTE — Progress Notes (Signed)
  Echocardiogram 2D Echocardiogram has been performed.  Nichole [name] 06/22/2018, 11:33 AM

## 2018-06-22 NOTE — Plan of Care (Signed)
  Problem: Education: Goal: Knowledge of General Education information will improve Description Including pain rating scale, medication(s)/side effects and non-pharmacologic comfort measures 06/22/2018 1506 by Domingo Sep, RN Outcome: Adequate for Discharge 06/22/2018 1502 by Domingo Sep, RN Outcome: Progressing   Problem: Health Behavior/Discharge Planning: Goal: Ability to manage health-related needs will improve 06/22/2018 1506 by Domingo Sep, RN Outcome: Adequate for Discharge 06/22/2018 1502 by Domingo Sep, RN Outcome: Progressing   Problem: Clinical Measurements: Goal: Ability to maintain clinical measurements within normal limits will improve 06/22/2018 1506 by Domingo Sep, RN Outcome: Adequate for Discharge 06/22/2018 1502 by Domingo Sep, RN Outcome: Adequate for Discharge Goal: Will remain free from infection Outcome: Completed/Met Goal: Diagnostic test results will improve 06/22/2018 1506 by Domingo Sep, RN Outcome: Adequate for Discharge 06/22/2018 1502 by Domingo Sep, RN Outcome: Adequate for Discharge Goal: Respiratory complications will improve 06/22/2018 1506 by Domingo Sep, RN Outcome: Adequate for Discharge 06/22/2018 1502 by Domingo Sep, RN Outcome: Adequate for Discharge Goal: Cardiovascular complication will be avoided 06/22/2018 1506 by Domingo Sep, RN Outcome: Adequate for Discharge 06/22/2018 1502 by Domingo Sep, RN Outcome: Adequate for Discharge   Problem: Activity: Goal: Risk for activity intolerance will decrease 06/22/2018 1506 by Domingo Sep, RN Outcome: Adequate for Discharge 06/22/2018 1502 by Domingo Sep, RN Outcome: Adequate for Discharge   Problem: Nutrition: Goal: Adequate nutrition will be maintained 06/22/2018 1506 by Domingo Sep, RN Outcome: Adequate for Discharge 06/22/2018 1502 by Domingo Sep, RN Outcome: Adequate  for Discharge   Problem: Coping: Goal: Level of anxiety will decrease 06/22/2018 1506 by Domingo Sep, RN Outcome: Adequate for Discharge 06/22/2018 1502 by Domingo Sep, RN Outcome: Adequate for Discharge   Problem: Elimination: Goal: Will not experience complications related to bowel motility Outcome: Completed/Met Goal: Will not experience complications related to urinary retention Outcome: Completed/Met   Problem: Pain Managment: Goal: General experience of comfort will improve Outcome: Completed/Met   Problem: Safety: Goal: Ability to remain free from injury will improve Outcome: Completed/Met   Problem: Skin Integrity: Goal: Risk for impaired skin integrity will decrease 06/22/2018 1506 by Domingo Sep, RN Outcome: Adequate for Discharge 06/22/2018 1502 by Domingo Sep, RN Outcome: Adequate for Discharge

## 2018-06-22 NOTE — Progress Notes (Addendum)
CSW spoke with the patient's daughter, Fonnie Jarvis. She confirmed patient is from [facility] facility and anticipates she will return once she is discharged.   CSW called facility and confirmed patient is from [facility] living.  Thurmond Butts, [place] Social Worker [phone]

## 2018-06-22 NOTE — Progress Notes (Signed)
DC summary given to patient and daughter at bedside. VSS. Pt walked hall with RN, HR max 105 Afib, SOB with exertion-MD aware. Case management aware of DC. Planned return to [facility] [employer] via private vehicle driven by daughter. All belongings sent home with family. PIV DC, hemostasis achieved. Pt and family educated on s/s and follow up appointments, as well as, new medication regimen.

## 2018-06-22 NOTE — Telephone Encounter (Signed)
Patient to discharge 06/22/18, has TOC appt 07/08/18 @ 9:00 am with Rosaria Ferries

## 2018-06-22 NOTE — Progress Notes (Signed)
Progress Note  Patient Name: Nichole [name] Date of Encounter: 06/22/2018  Primary Cardiologist: Buford Dresser, MD, PhD (new)  Subjective   Doing well this AM. No chest pain or shortness of breath. Reviewed medication plan with patient, her daughter and son-in-law.  Inpatient Medications    Scheduled Meds: . apixaban  2.5 mg Oral BID  . diltiazem  120 mg Oral Daily  . [START ON 06/23/2018] donepezil  5 mg Oral Once per day on Mon Thu  . furosemide  20 mg Intravenous Once   Continuous Infusions: . diltiazem (CARDIZEM) infusion 5 mg/hr (06/22/18 0700)  . magnesium sulfate 1 - 4 g bolus IVPB     PRN Meds: acetaminophen **OR** acetaminophen, ondansetron **OR** ondansetron (ZOFRAN) IV   Vital Signs    Vitals:   06/22/18 0500 06/22/18 0600 06/22/18 0700 06/22/18 0739  BP: 137/77 137/78 127/69 139/73  Pulse: 78 91 (!) 29 (!) 104  Resp: 15 (!) 24 19 20   Temp:    (!) 97.5 F (36.4 C)  TempSrc:    Oral  SpO2: 91% 93% 90% 98%    Intake/Output Summary (Last 24 hours) at 06/22/2018 0845 Last data filed at 06/22/2018 0700 Gross per 24 hour  Intake 54.36 ml  Output -  Net 54.36 ml   There were no vitals filed for this visit.  Telemetry    Atrial fib, improving rate control - Personally Reviewed  ECG    No new since yesterday - Personally Reviewed  Physical Exam   GEN: No acute distress.   Neck: No JVD Cardiac: irregularly irregular, no rubs, or gallops. 1/6 SM Respiratory: Clear to auscultation bilaterally. GI: Soft, nontender, non-distended  MS: bilateral trace ankle edema; No deformity. Neuro:  Nonfocal  Psych: Normal affect   Labs    Chemistry Recent Labs  Lab 06/21/18 1525 06/22/18 0205  NA 141 140  K 4.5 4.0  CL 106 105  CO2 23 24  GLUCOSE 112* 94  BUN 27* 26*  CREATININE 1.22* 1.13*  CALCIUM 9.3 8.9  PROT  --  6.2*  ALBUMIN  --  3.1*  AST  --  25  ALT  --  21  ALKPHOS  --  100  BILITOT  --  1.3*  GFRNONAA 38* 42*  GFRAA 44*  49*  ANIONGAP 12 11     Hematology Recent Labs  Lab 06/21/18 1525 06/22/18 0205  WBC 7.3 8.0  RBC 3.65* 3.40*  3.40*  HGB 11.1* 10.6*  HCT 36.8 33.6*  MCV 100.8* 98.8  MCH 30.4 31.2  MCHC 30.2 31.5  RDW 15.4 15.3  PLT 231 247    Cardiac Enzymes Recent Labs  Lab 06/21/18 2216 06/22/18 0205  TROPONINI <0.03 0.03*    Recent Labs  Lab 06/21/18 1643  TROPIPOC 0.01     BNP Recent Labs  Lab 06/21/18 1639  BNP 534.6*     DDimer No results for input(s): DDIMER in the last 168 hours.   Radiology    Ct Head Wo Contrast  Result Date: 06/21/2018 CLINICAL DATA:  No onset atrial fibrillation.  Worsening dementia. EXAM: CT HEAD WITHOUT CONTRAST TECHNIQUE: Contiguous axial images were obtained from the base of the skull through the vertex without intravenous contrast. COMPARISON:  None. FINDINGS: Brain: Age related cerebral atrophy, ventriculomegaly and periventricular white matter disease. There are also remote lacunar type basal ganglia and left cerebellar infarcts. No extra-axial fluid collections are identified. No CT findings for acute hemispheric infarction or intracranial hemorrhage. No mass lesions.  The brainstem and cerebellum are normal. Vascular: Vascular calcifications without obvious aneurysm or hyperdense vessels. Skull: No acute bony findings or worrisome bone lesions. Sinuses/Orbits: The paranasal sinuses and mastoid air cells are clear. Globes are intact. Other: No scalp lesions or hematoma. IMPRESSION: 1. Advanced cerebral atrophy, ventriculomegaly and periventricular white matter disease. 2. No acute intracranial findings or mass lesions. Electronically Signed   By: Marijo Sanes M.D.   On: 06/21/2018 19:42   Dg Chest Port 1 View  Result Date: 06/21/2018 CLINICAL DATA:  New onset atrial fibrillation.  Shortness of breath. EXAM: PORTABLE CHEST 1 VIEW COMPARISON:  None. FINDINGS: Small bilateral pleural effusions with underlying opacities identified. Cardiomegaly.  The hila and mediastinum are unremarkable. No overt edema. IMPRESSION: Bilateral pleural effusions with underlying opacities, likely atelectasis. No overt edema. Electronically Signed   By: Dorise Bullion III M.D   On: 06/21/2018 16:52    Cardiac Studies   Echo pending  Patient Profile     84 y.o. female  with a hx of Alzheiner's, prior TIA, recent hip fracture who is being followed for the management of new onset atrial fibrillation   Assessment & Plan    1. New onset atrial fibrillation: CHADSVASC=5. Plan for rate control.        -can consolidate to 120 mg daily dose. Would stop dilt drip after she receives AM oral dose of diltiazem and have her ambulate. If rates stay <110 with ambulation, should be good to continue on this dose       -started apixaban 2.5 mg BID based on age and weight. Reviewed again the indications, risks, benefits with patient and her family. Amenable to continue  2. Lower extremity edema: this may be due to tachycardiac driven cardiac dysfunction.  -pending echo today. Only change would be that if EF is reduced <45%, would change from diltiazem to metoprolol  [facility] will sign off in anticipation of discharge (will watch for her echo results).   Medication Recommendations:  Diltiazem, apixaban as noted Other recommendations (labs, testing, etc):  none Follow up as an outpatient:  We will arrange post hospital follow up in our office.  For questions or updates, please contact [place] Please consult [email or web address] for contact info under   Buford Dresser, MD, PhD [facility]      Signed, Buford Dresser, MD  06/22/2018, 8:45 AM

## 2018-06-22 NOTE — Discharge Summary (Signed)
Physician Discharge Summary  Nichole [name] OZH:[id number] DOB: [birth year] DOA: 06/21/2018  PCP: Aretta Nip, MD  Admit date: 06/21/2018 Discharge date: 06/22/2018  Admitted From: [facility] Disposition: [facility] Recommendations for Outpatient Follow-up:  1. Follow up with PCP in 1-2 weeks 2. Please obtain BMP/CBC in one week  Home Health: PT OT speech and aide Equipment/Devices none   Discharge Condition: Stable CODE STATUS DO NOT RESUSCITATE Diet recommendation: Cardiac Brief/Interim Summary:84 y.o. female with medical history significant of Alzheimer's disease, breast cancer, TIA, B12 deficiency, recent right hip fracture and right hip arthroplasty about 6 weeks ago who is coming to the emergency department after incidentally she was found to have a fast and irregular heart rhythm on routine medical exam by her PCP.  She has also have lower extremity edema, dyspnea on exertion, increased confusion over the last few days.  There has not having any chest pain, palpitations, dizziness, PND or orthopnea that her daughter has noticed.  Her appetite has been less than usual.  No fever, sore throat, wheezing, hemoptysis, nausea, emesis, constipation, diarrhea, melena or hematochezia.  No dysuria, frequency or hematuria.  ED Course: Initial vital signs temperature 97.7 F, pulse 103, respirations 16, blood pressure 160/87 mmHg and O2 sat 96% on room air.  The patient was given 10 mg Cardizem IVP bolus and then was started on a Cardizem continuous infusion.  Her urinalysis show moderate hemoglobinuria, proteinuria 30 and ketonuria 5 mg/dL.  There was small leukocyte esterase.  Microscopic exam shows few bacteria with 6-10 RBC and 11-20 WBC per hpf.  CBC shows a white count of 7.3, hemoglobin 11.1 g/dL and platelets 131.  BMP shows normal electrolytes.  BUN was 27, creatinine 1.22 and glucose 112 mg/dL. Chest radiograph shows cardiomegaly,  bilateral pleural effusions with underlying opacities, likely atelectasis.  There was no overt edema. Discharge Diagnoses:  Principal Problem:   Atrial fibrillation with RVR (HCC) Active Problems:   HTN (hypertension)   Alzheimer disease (HCC)   Anemia   Renal insufficiency   Bilateral lower extremity edema  A. fib with RVR-   CHA?DS?-VASc Score of at least 5.  Was initially treated with Cardizem infusion.  Then Cardizem was changed to p.o.  Patient was seen by cardiology.  Started on apixaban 2.5 mg twice a day for stroke prophylaxis.  Echocardiogram showed ejection fraction 55%.   Active Problems:   HTN (hypertension) Continue Cardizem     Alzheimer disease ([place]) Per daughter, she has been declining rapidly lately. TSH was normal. B12 level was recently low.  CT of head-   Advanced cerebral atrophy ventriculomegaly and periventricular white matter disease.    Anemia-chronic stable.     Estimated body mass index is 22.47 kg/m as calculated from the following:   Height as of 05/09/18: 5\' 5"  (1.651 m).   Weight as of 05/09/18: 61.2 kg.  Discharge Instructions  Discharge Instructions    Call MD for:  difficulty breathing, headache or visual disturbances   Complete by:  As directed    Call MD for:  persistant nausea and vomiting   Complete by:  As directed    Call MD for:  severe uncontrolled pain   Complete by:  As directed    Diet - low sodium heart healthy   Complete by:  As directed    Increase activity slowly   Complete by:  As directed      Allergies as of 06/22/2018      Reactions  Tape Other (See Comments)   SKIN IS VERY THIN, SO IT WILL TEAR AND BRUISE EASILY!!      Medication List    STOP taking these medications   aspirin 81 MG tablet   docusate sodium 100 MG capsule Commonly known as:  COLACE   feeding supplement (ENSURE ENLIVE) Liqd   polyethylene glycol packet Commonly known as:  MIRALAX / GLYCOLAX   senna 8.6 MG Tabs tablet Commonly  known as:  SENOKOT     TAKE these medications   apixaban 2.5 MG Tabs tablet Commonly known as:  ELIQUIS Take 1 tablet (2.5 mg total) by mouth 2 (two) times daily.   diltiazem 120 MG 24 hr capsule Commonly known as:  CARDIZEM CD Take 1 capsule (120 mg total) by mouth daily. Start taking on:  June 23, 2018   donepezil 5 MG tablet Commonly known as:  ARICEPT Take 5 mg by mouth 2 (two) times a week.   traMADol 50 MG tablet Commonly known as:  ULTRAM Take 1 tablet (50 mg total) by mouth every 6 (six) hours as needed. What changed:  reasons to take this      Follow-up Information    Barrett, Evelene Croon, PA-C Follow up.   Specialties:  Cardiology, Radiology Why:  Cardiology hospital follow-up on 07/08/2018 at 9:00 AM.  Please arrive 15 minutes early for check-in. Contact information: [place] [address] [phone]          Allergies  Allergen Reactions  . Tape Other (See Comments)    SKIN IS VERY THIN, SO IT WILL TEAR AND BRUISE EASILY!!    Consultations: Cardiology  Procedures/Studies: Ct Head Wo Contrast  Result Date: 06/21/2018 CLINICAL DATA:  No onset atrial fibrillation.  Worsening dementia. EXAM: CT HEAD WITHOUT CONTRAST TECHNIQUE: Contiguous axial images were obtained from the base of the skull through the vertex without intravenous contrast. COMPARISON:  None. FINDINGS: Brain: Age related cerebral atrophy, ventriculomegaly and periventricular white matter disease. There are also remote lacunar type basal ganglia and left cerebellar infarcts. No extra-axial fluid collections are identified. No CT findings for acute hemispheric infarction or intracranial hemorrhage. No mass lesions. The brainstem and cerebellum are normal. Vascular: Vascular calcifications without obvious aneurysm or hyperdense vessels. Skull: No acute bony findings or worrisome bone lesions. Sinuses/Orbits: The paranasal sinuses and mastoid air cells are clear. Globes  are intact. Other: No scalp lesions or hematoma. IMPRESSION: 1. Advanced cerebral atrophy, ventriculomegaly and periventricular white matter disease. 2. No acute intracranial findings or mass lesions. Electronically Signed   By: Marijo Sanes M.D.   On: 06/21/2018 19:42   Dg Chest Port 1 View  Result Date: 06/21/2018 CLINICAL DATA:  New onset atrial fibrillation.  Shortness of breath. EXAM: PORTABLE CHEST 1 VIEW COMPARISON:  None. FINDINGS: Small bilateral pleural effusions with underlying opacities identified. Cardiomegaly. The hila and mediastinum are unremarkable. No overt edema. IMPRESSION: Bilateral pleural effusions with underlying opacities, likely atelectasis. No overt edema. Electronically Signed   By: Dorise Bullion III M.D   On: 06/21/2018 16:52    (Echo, Carotid, EGD, Colonoscopy, ERCP)    Subjective: Reports feeling better no chest pain shortness of breath anxious to go home  Discharge Exam: Vitals:   06/22/18 1000 06/22/18 1127  BP: 125/74 123/73  Pulse: 93 73  Resp: (!) 21 20  Temp:  (!) 97.4 F (36.3 C)  SpO2: 95% 94%   Vitals:   06/22/18 0739 06/22/18 0800 06/22/18 1000 06/22/18 1127  BP: 139/73 131/60 125/74 123/73  Pulse: (!) 104 71 93 73  Resp: 20 (!) 24 (!) 21 20  Temp: (!) 97.5 F (36.4 C)   (!) 97.4 F (36.3 C)  TempSrc: Oral   Oral  SpO2: 98% 94% 95% 94%    General: Pt is alert, awake, not in acute distress Cardiovascular: irreg irreg  S1/S2 +, no rubs, no gallops Respiratory: CTA bilaterally, no wheezing, no rhonchi Abdominal: Soft, NT, ND, bowel sounds + Extremities: no edema, no cyanosis    The results of significant diagnostics from this hospitalization (including imaging, microbiology, ancillary and laboratory) are listed below for reference.     Microbiology: Recent Results (from the past 240 hour(s))  MRSA PCR Screening     Status: None   Collection Time: 06/21/18 10:28 PM  Result Value Ref Range Status   MRSA by PCR NEGATIVE  NEGATIVE Final    Comment:        The GeneXpert MRSA Assay (FDA approved for NASAL specimens only), is one component of a comprehensive MRSA colonization surveillance program. It is not intended to diagnose MRSA infection nor to guide or monitor treatment for MRSA infections. Performed at [facility], [place] [address], [place], [place] [address]      Labs: BNP (last 3 results) Recent Labs    06/21/18 1639  BNP 831.5*   Basic Metabolic Panel: Recent Labs  Lab 06/21/18 1525 06/22/18 0205  NA 141 140  K 4.5 4.0  CL 106 105  CO2 23 24  GLUCOSE 112* 94  BUN 27* 26*  CREATININE 1.22* 1.13*  CALCIUM 9.3 8.9  MG 2.4  --    Liver Function Tests: Recent Labs  Lab 06/22/18 0205  AST 25  ALT 21  ALKPHOS 100  BILITOT 1.3*  PROT 6.2*  ALBUMIN 3.1*   No results for input(s): LIPASE, AMYLASE in the last 168 hours. No results for input(s): AMMONIA in the last 168 hours. CBC: Recent Labs  Lab 06/21/18 1525 06/22/18 0205  WBC 7.3 8.0  NEUTROABS  --  6.0  HGB 11.1* 10.6*  HCT 36.8 33.6*  MCV 100.8* 98.8  PLT 231 247   Cardiac Enzymes: Recent Labs  Lab 06/21/18 2216 06/22/18 0205  TROPONINI <0.03 0.03*   BNP: Invalid input(s): POCBNP CBG: No results for input(s): GLUCAP in the last 168 hours. D-Dimer No results for input(s): DDIMER in the last 72 hours. Hgb A1c No results for input(s): HGBA1C in the last 72 hours. Lipid Profile No results for input(s): CHOL, HDL, LDLCALC, TRIG, CHOLHDL, LDLDIRECT in the last 72 hours. Thyroid function studies Recent Labs    06/21/18 1639  TSH 3.696   Anemia work up Recent Labs    06/22/18 0205  VITAMINB12 676  FOLATE 15.2  FERRITIN 84  TIBC 294  IRON 54  RETICCTPCT 2.3   Urinalysis    Component Value Date/Time   COLORURINE YELLOW 06/21/2018 1616   APPEARANCEUR HAZY (A) 06/21/2018 1616   LABSPEC 1.026 06/21/2018 1616   PHURINE 5.0 06/21/2018 1616   GLUCOSEU NEGATIVE 06/21/2018 1616   HGBUR  MODERATE (A) 06/21/2018 1616   BILIRUBINUR NEGATIVE 06/21/2018 1616   KETONESUR 5 (A) 06/21/2018 1616   PROTEINUR 30 (A) 06/21/2018 1616   NITRITE NEGATIVE 06/21/2018 1616   LEUKOCYTESUR SMALL (A) 06/21/2018 1616   Sepsis Labs Invalid input(s): PROCALCITONIN,  WBC,  LACTICIDVEN Microbiology Recent Results (from the past 240 hour(s))  MRSA PCR Screening     Status: None  Collection Time: 06/21/18 10:28 PM  Result Value Ref Range Status   MRSA by PCR NEGATIVE NEGATIVE Final    Comment:        The GeneXpert MRSA Assay (FDA approved for NASAL specimens only), is one component of a comprehensive MRSA colonization surveillance program. It is not intended to diagnose MRSA infection nor to guide or monitor treatment for MRSA infections. Performed at [facility], [place] [address] [place], [place] [address]      Time coordinating discharge: 33  minutes  SIGNED:   Georgette Shell, MD  [facility] 06/22/2018, 2:22 PM Pager   If 7PM-7AM, please contact night-coverage [email or web address] Password [email or web address]

## 2018-06-23 DIAGNOSIS — F0280 Dementia in other diseases classified elsewhere without behavioral disturbance: Secondary | ICD-10-CM | POA: Diagnosis not present

## 2018-06-23 DIAGNOSIS — F4325 Adjustment disorder with mixed disturbance of emotions and conduct: Secondary | ICD-10-CM | POA: Diagnosis not present

## 2018-06-23 DIAGNOSIS — S72011D Unspecified intracapsular fracture of right femur, subsequent encounter for closed fracture with routine healing: Secondary | ICD-10-CM | POA: Diagnosis not present

## 2018-06-23 DIAGNOSIS — D500 Iron deficiency anemia secondary to blood loss (chronic): Secondary | ICD-10-CM | POA: Diagnosis not present

## 2018-06-23 DIAGNOSIS — G309 Alzheimer's disease, unspecified: Secondary | ICD-10-CM | POA: Diagnosis not present

## 2018-06-23 DIAGNOSIS — Z96651 Presence of right artificial knee joint: Secondary | ICD-10-CM | POA: Diagnosis not present

## 2018-06-23 NOTE — Telephone Encounter (Signed)
Patient contacted regarding discharge from [facility] on 06/22/18.  Patient understands to follow up with provider Barrett on 07/08/18 at 9 am  at [facility]. Patient understands discharge instructions? yes Patient understands medications and regiment? yes Patient understands to bring all medications to this visit? yes  I spoke with daughter as mother has dementia

## 2018-06-24 DIAGNOSIS — S72011D Unspecified intracapsular fracture of right femur, subsequent encounter for closed fracture with routine healing: Secondary | ICD-10-CM | POA: Diagnosis not present

## 2018-06-24 DIAGNOSIS — G309 Alzheimer's disease, unspecified: Secondary | ICD-10-CM | POA: Diagnosis not present

## 2018-06-24 DIAGNOSIS — F4325 Adjustment disorder with mixed disturbance of emotions and conduct: Secondary | ICD-10-CM | POA: Diagnosis not present

## 2018-06-24 DIAGNOSIS — F0280 Dementia in other diseases classified elsewhere without behavioral disturbance: Secondary | ICD-10-CM | POA: Diagnosis not present

## 2018-06-24 DIAGNOSIS — D500 Iron deficiency anemia secondary to blood loss (chronic): Secondary | ICD-10-CM | POA: Diagnosis not present

## 2018-06-24 DIAGNOSIS — Z96651 Presence of right artificial knee joint: Secondary | ICD-10-CM | POA: Diagnosis not present

## 2018-06-27 DIAGNOSIS — F0280 Dementia in other diseases classified elsewhere without behavioral disturbance: Secondary | ICD-10-CM | POA: Diagnosis not present

## 2018-06-27 DIAGNOSIS — D500 Iron deficiency anemia secondary to blood loss (chronic): Secondary | ICD-10-CM | POA: Diagnosis not present

## 2018-06-27 DIAGNOSIS — S72011D Unspecified intracapsular fracture of right femur, subsequent encounter for closed fracture with routine healing: Secondary | ICD-10-CM | POA: Diagnosis not present

## 2018-06-27 DIAGNOSIS — G309 Alzheimer's disease, unspecified: Secondary | ICD-10-CM | POA: Diagnosis not present

## 2018-06-27 DIAGNOSIS — F4325 Adjustment disorder with mixed disturbance of emotions and conduct: Secondary | ICD-10-CM | POA: Diagnosis not present

## 2018-06-27 DIAGNOSIS — Z96651 Presence of right artificial knee joint: Secondary | ICD-10-CM | POA: Diagnosis not present

## 2018-06-28 DIAGNOSIS — G309 Alzheimer's disease, unspecified: Secondary | ICD-10-CM | POA: Diagnosis not present

## 2018-06-28 DIAGNOSIS — D500 Iron deficiency anemia secondary to blood loss (chronic): Secondary | ICD-10-CM | POA: Diagnosis not present

## 2018-06-28 DIAGNOSIS — S72011D Unspecified intracapsular fracture of right femur, subsequent encounter for closed fracture with routine healing: Secondary | ICD-10-CM | POA: Diagnosis not present

## 2018-06-28 DIAGNOSIS — F4325 Adjustment disorder with mixed disturbance of emotions and conduct: Secondary | ICD-10-CM | POA: Diagnosis not present

## 2018-06-28 DIAGNOSIS — F0280 Dementia in other diseases classified elsewhere without behavioral disturbance: Secondary | ICD-10-CM | POA: Diagnosis not present

## 2018-06-28 DIAGNOSIS — Z96651 Presence of right artificial knee joint: Secondary | ICD-10-CM | POA: Diagnosis not present

## 2018-06-30 DIAGNOSIS — G309 Alzheimer's disease, unspecified: Secondary | ICD-10-CM | POA: Diagnosis not present

## 2018-06-30 DIAGNOSIS — Z96651 Presence of right artificial knee joint: Secondary | ICD-10-CM | POA: Diagnosis not present

## 2018-06-30 DIAGNOSIS — S72011D Unspecified intracapsular fracture of right femur, subsequent encounter for closed fracture with routine healing: Secondary | ICD-10-CM | POA: Diagnosis not present

## 2018-06-30 DIAGNOSIS — Z09 Encounter for follow-up examination after completed treatment for conditions other than malignant neoplasm: Secondary | ICD-10-CM | POA: Diagnosis not present

## 2018-06-30 DIAGNOSIS — F0390 Unspecified dementia without behavioral disturbance: Secondary | ICD-10-CM | POA: Diagnosis not present

## 2018-06-30 DIAGNOSIS — M7989 Other specified soft tissue disorders: Secondary | ICD-10-CM | POA: Diagnosis not present

## 2018-06-30 DIAGNOSIS — F4325 Adjustment disorder with mixed disturbance of emotions and conduct: Secondary | ICD-10-CM | POA: Diagnosis not present

## 2018-06-30 DIAGNOSIS — F0280 Dementia in other diseases classified elsewhere without behavioral disturbance: Secondary | ICD-10-CM | POA: Diagnosis not present

## 2018-06-30 DIAGNOSIS — I4891 Unspecified atrial fibrillation: Secondary | ICD-10-CM | POA: Diagnosis not present

## 2018-06-30 DIAGNOSIS — D500 Iron deficiency anemia secondary to blood loss (chronic): Secondary | ICD-10-CM | POA: Diagnosis not present

## 2018-07-01 DIAGNOSIS — S72011D Unspecified intracapsular fracture of right femur, subsequent encounter for closed fracture with routine healing: Secondary | ICD-10-CM | POA: Diagnosis not present

## 2018-07-01 DIAGNOSIS — F4325 Adjustment disorder with mixed disturbance of emotions and conduct: Secondary | ICD-10-CM | POA: Diagnosis not present

## 2018-07-01 DIAGNOSIS — D500 Iron deficiency anemia secondary to blood loss (chronic): Secondary | ICD-10-CM | POA: Diagnosis not present

## 2018-07-01 DIAGNOSIS — F0280 Dementia in other diseases classified elsewhere without behavioral disturbance: Secondary | ICD-10-CM | POA: Diagnosis not present

## 2018-07-01 DIAGNOSIS — G309 Alzheimer's disease, unspecified: Secondary | ICD-10-CM | POA: Diagnosis not present

## 2018-07-01 DIAGNOSIS — Z96651 Presence of right artificial knee joint: Secondary | ICD-10-CM | POA: Diagnosis not present

## 2018-07-04 DIAGNOSIS — F4325 Adjustment disorder with mixed disturbance of emotions and conduct: Secondary | ICD-10-CM | POA: Diagnosis not present

## 2018-07-04 DIAGNOSIS — D500 Iron deficiency anemia secondary to blood loss (chronic): Secondary | ICD-10-CM | POA: Diagnosis not present

## 2018-07-04 DIAGNOSIS — G309 Alzheimer's disease, unspecified: Secondary | ICD-10-CM | POA: Diagnosis not present

## 2018-07-04 DIAGNOSIS — F0280 Dementia in other diseases classified elsewhere without behavioral disturbance: Secondary | ICD-10-CM | POA: Diagnosis not present

## 2018-07-04 DIAGNOSIS — Z96641 Presence of right artificial hip joint: Secondary | ICD-10-CM | POA: Diagnosis not present

## 2018-07-04 DIAGNOSIS — S72031D Displaced midcervical fracture of right femur, subsequent encounter for closed fracture with routine healing: Secondary | ICD-10-CM | POA: Diagnosis not present

## 2018-07-04 DIAGNOSIS — S72011D Unspecified intracapsular fracture of right femur, subsequent encounter for closed fracture with routine healing: Secondary | ICD-10-CM | POA: Diagnosis not present

## 2018-07-04 DIAGNOSIS — Z96651 Presence of right artificial knee joint: Secondary | ICD-10-CM | POA: Diagnosis not present

## 2018-07-05 DIAGNOSIS — G309 Alzheimer's disease, unspecified: Secondary | ICD-10-CM | POA: Diagnosis not present

## 2018-07-05 DIAGNOSIS — F4325 Adjustment disorder with mixed disturbance of emotions and conduct: Secondary | ICD-10-CM | POA: Diagnosis not present

## 2018-07-05 DIAGNOSIS — D500 Iron deficiency anemia secondary to blood loss (chronic): Secondary | ICD-10-CM | POA: Diagnosis not present

## 2018-07-05 DIAGNOSIS — F0280 Dementia in other diseases classified elsewhere without behavioral disturbance: Secondary | ICD-10-CM | POA: Diagnosis not present

## 2018-07-05 DIAGNOSIS — S72011D Unspecified intracapsular fracture of right femur, subsequent encounter for closed fracture with routine healing: Secondary | ICD-10-CM | POA: Diagnosis not present

## 2018-07-05 DIAGNOSIS — Z96651 Presence of right artificial knee joint: Secondary | ICD-10-CM | POA: Diagnosis not present

## 2018-07-07 DIAGNOSIS — D500 Iron deficiency anemia secondary to blood loss (chronic): Secondary | ICD-10-CM | POA: Diagnosis not present

## 2018-07-07 DIAGNOSIS — F4325 Adjustment disorder with mixed disturbance of emotions and conduct: Secondary | ICD-10-CM | POA: Diagnosis not present

## 2018-07-07 DIAGNOSIS — F0280 Dementia in other diseases classified elsewhere without behavioral disturbance: Secondary | ICD-10-CM | POA: Diagnosis not present

## 2018-07-07 DIAGNOSIS — G309 Alzheimer's disease, unspecified: Secondary | ICD-10-CM | POA: Diagnosis not present

## 2018-07-07 DIAGNOSIS — S72011D Unspecified intracapsular fracture of right femur, subsequent encounter for closed fracture with routine healing: Secondary | ICD-10-CM | POA: Diagnosis not present

## 2018-07-07 DIAGNOSIS — Z96651 Presence of right artificial knee joint: Secondary | ICD-10-CM | POA: Diagnosis not present

## 2018-07-08 ENCOUNTER — Ambulatory Visit (INDEPENDENT_AMBULATORY_CARE_PROVIDER_SITE_OTHER): Payer: Medicare Other | Admitting: Physician Assistant

## 2018-07-08 ENCOUNTER — Encounter: Payer: Self-pay | Admitting: Physician Assistant

## 2018-07-08 VITALS — BP 146/68 | HR 92 | Ht 65.0 in | Wt 136.4 lb

## 2018-07-08 DIAGNOSIS — F0280 Dementia in other diseases classified elsewhere without behavioral disturbance: Secondary | ICD-10-CM | POA: Diagnosis not present

## 2018-07-08 DIAGNOSIS — Z79899 Other long term (current) drug therapy: Secondary | ICD-10-CM

## 2018-07-08 DIAGNOSIS — D500 Iron deficiency anemia secondary to blood loss (chronic): Secondary | ICD-10-CM | POA: Diagnosis not present

## 2018-07-08 DIAGNOSIS — Z96651 Presence of right artificial knee joint: Secondary | ICD-10-CM | POA: Diagnosis not present

## 2018-07-08 DIAGNOSIS — I350 Nonrheumatic aortic (valve) stenosis: Secondary | ICD-10-CM

## 2018-07-08 DIAGNOSIS — I4819 Other persistent atrial fibrillation: Secondary | ICD-10-CM | POA: Diagnosis not present

## 2018-07-08 DIAGNOSIS — F4325 Adjustment disorder with mixed disturbance of emotions and conduct: Secondary | ICD-10-CM | POA: Diagnosis not present

## 2018-07-08 DIAGNOSIS — I5031 Acute diastolic (congestive) heart failure: Secondary | ICD-10-CM

## 2018-07-08 DIAGNOSIS — I10 Essential (primary) hypertension: Secondary | ICD-10-CM

## 2018-07-08 DIAGNOSIS — G309 Alzheimer's disease, unspecified: Secondary | ICD-10-CM | POA: Diagnosis not present

## 2018-07-08 DIAGNOSIS — S72011D Unspecified intracapsular fracture of right femur, subsequent encounter for closed fracture with routine healing: Secondary | ICD-10-CM | POA: Diagnosis not present

## 2018-07-08 MED ORDER — FUROSEMIDE 20 MG PO TABS: ORAL_TABLET | ORAL | 3 refills | Status: DC

## 2018-07-08 MED ORDER — POTASSIUM CHLORIDE ER 10 MEQ PO TBCR: 10.0000 meq | EXTENDED_RELEASE_TABLET | Freq: Every day | ORAL | 3 refills | Status: DC

## 2018-07-08 NOTE — Patient Instructions (Signed)
Medication Instructions:  Your physician has recommended you make the following change in your medication:  1.  START Lasix 20 mg taking 1 tablet by mouth daily for 3 days, then go to taking 1 tablet on Mondays, Wednesdays, & Fridays only. 2.  START Potassium 10 meq taking 1 tablet daily  If you need a refill on your cardiac medications before your next appointment, please call your pharmacy.   Lab work: 1 WEEK:  BMET (I have given a lab slip, if Dr. Zadie Rhine can't draw it at the facility, please bring her by the office Next Friday for it.  If you have labs (blood work) drawn today and your tests are completely normal, you will receive your results only by: Marland Kitchen MyChart Message (if you have MyChart) OR . A paper copy in the mail If you have any lab test that is abnormal or we need to change your treatment, we will call you to review the results.  Testing/Procedures: None ordered  Follow-Up: Your physician recommends that you schedule a follow-up appointment in: 3-4 WEEKS WITH DR. Harrell Gave    Any Other Special Instructions Will Be Listed Below (If Applicable).

## 2018-07-08 NOTE — Progress Notes (Signed)
Cardiology Office Note   Date:  07/08/2018   ID:  Nichole [name], DOB September 16, 1922, MRN [id number]  PCP:  Aretta Nip, MD Cardiologist:  Buford Dresser, MD  06/22/2018 in hospital Rosaria Ferries, PA-C   No chief complaint on file.   History of Present Illness: Nichole [name] is a 85 y.o. female with a history of Alzheimer's, TIA, breast cancer, A. Fib dx 06/21/2018 on Eliquis and Dilt, CHA2DS2-VASc = 5 (age x 2, TIA x 2, female)  Nichole [name] presents for cardiology follow up. She lives in [facility] independent living, family is scheduling helper to make sure she takes rx.  Her son-in-law is here with her today.  Pt denies awareness of skipping heart rate, denies presyncope or syncope.   HHRN found elevated HR and BP, but pt had not taken the Dilt. After rx, BP/HR improved.   Has had LE edema, PCP recommended compression stockings, per son-in-law pt will not wear.  However, she has had some weeping in her lower legs due to the edema.  Granddaughter goes by every morning and make sure she takes her meds, daughter or son-in-law visit in the evening to make sure she takes her evening medications.  Patient states she is not on any medications and does not take any pills.  She denies chest pain or shortness of breath.  She denies orthopnea or PND.  Son-in-law states that the doctor mentioned a heart fluttering when she was having hip surgery after a fall in November 2019.  Prior to that, no one had ever mentioned an irregular heartbeat.   Past Medical History:  Diagnosis Date  . Alzheimer disease ([place]) 08/25/2017  . Breast CA ([place])   . HTN (hypertension)   . Persistent atrial fibrillation 05/2018  . TIA (transient ischemic attack) 2009    Past Surgical History:  Procedure Laterality Date  . ANTERIOR APPROACH HEMI HIP ARTHROPLASTY Right 05/10/2018   Procedure: ANTERIOR APPROACH HEMI HIP ARTHROPLASTY;  Surgeon: Rod Can, MD;  Location: [facility];   Service: Orthopedics;  Laterality: Right;  . BREAST SURGERY    . MASTECTOMY Left 1995    Current Outpatient Medications  Medication Sig Dispense Refill  . apixaban (ELIQUIS) 2.5 MG TABS tablet Take 1 tablet (2.5 mg total) by mouth 2 (two) times daily. 60 tablet 1  . diltiazem (CARDIZEM CD) 120 MG 24 hr capsule Take 1 capsule (120 mg total) by mouth daily. 30 capsule 1  . donepezil (ARICEPT) 5 MG tablet Take 5 mg by mouth 2 (two) times a week.   0   No current facility-administered medications for this visit.     Allergies:   Tape    Social History:  The patient  reports that she has never smoked. She has never used smokeless tobacco. She reports that she does not drink alcohol or use drugs.   Family History:  The patient's family history includes AAA (abdominal aortic aneurysm) in her mother; Breast cancer in her sister; Colon cancer in her mother; Coronary artery disease in her father; Ovarian cancer in her sister.  She indicated that her mother is deceased. She indicated that her father is deceased. She indicated that her sister is deceased. She indicated that both of her brothers are deceased.    ROS:  Please see the history of present illness. All other systems are reviewed and negative.    PHYSICAL EXAM: VS:  BP (!) 146/68   Pulse 92   Ht 5\' 5"  (1.651 m)  Wt 136 lb 6.4 oz (61.9 kg)   SpO2 92%   BMI 22.70 kg/m  , BMI Body mass index is 22.7 kg/m. GEN: Well nourished, well developed, female in no acute distress HEENT: normal for age  Neck: JVD 10 cm, no carotid bruit, no masses Cardiac: Irregular rate and rhythm; 2/6 murmur, no rubs, or gallops Respiratory: Decreased breath sounds bases bilaterally, normal work of breathing GI: soft, nontender, nondistended, + BS MS: no deformity or atrophy; 1-2+ edema; distal pulses are 2+ in all 4 extremities  Skin: warm and dry, no rash Neuro:  Strength and sensation are intact Psych: euthymic mood, full affect   EKG:  EKG is  not ordered today.   ECHO: 06/22/2018 - Left ventricle: The cavity size was normal. Wall thickness was   normal. Systolic function was normal. The estimated ejection   fraction was in the range of 55% to 60%. - Aortic valve: There was mild stenosis. There was mild   regurgitation. Valve area (VTI): 0.6 cm^2. Valve area (Vmax):   0.67 cm^2. Valve area (Vmean): 0.63 cm^2. - Mitral valve: Calcified annulus. There was moderate   regurgitation. Valve area by continuity equation (using LVOT   flow): 0.54 cm^2. Mean gradient (S): 9 mm Hg. Peak gradient (S): 17 mm Hg. - Left atrium: The atrium was moderately dilated. - Right atrium: The atrium was mildly dilated. - Atrial septum: No defect or patent foramen ovale was identified. - Tricuspid valve: There was severe regurgitation. - Pulmonary arteries: PA peak pressure: 61 mm Hg (S).  Recent Labs: 06/21/2018: B Natriuretic Peptide 534.6; Magnesium 2.4; TSH 3.696 06/22/2018: ALT 21; BUN 26; Creatinine, Ser 1.13; Hemoglobin 10.6; Platelets 247; Potassium 4.0; Sodium 140  CBC    Component Value Date/Time   WBC 8.0 06/22/2018 0205   RBC 3.40 (L) 06/22/2018 0205   RBC 3.40 (L) 06/22/2018 0205   HGB 10.6 (L) 06/22/2018 0205   HCT 33.6 (L) 06/22/2018 0205   PLT 247 06/22/2018 0205   MCV 98.8 06/22/2018 0205   MCH 31.2 06/22/2018 0205   MCHC 31.5 06/22/2018 0205   RDW 15.3 06/22/2018 0205   LYMPHSABS 1.1 06/22/2018 0205   MONOABS 0.7 06/22/2018 0205   EOSABS 0.1 06/22/2018 0205   BASOSABS 0.0 06/22/2018 0205   CMP Latest Ref Rng & Units 06/22/2018 06/21/2018 05/12/2018  Glucose 70 - 99 mg/dL 94 112(H) 96  BUN 8 - 23 mg/dL 26(H) 27(H) 20  Creatinine 0.44 - 1.00 mg/dL 1.13(H) 1.22(H) 0.94  Sodium 135 - 145 mmol/L 140 141 135  Potassium 3.5 - 5.1 mmol/L 4.0 4.5 4.2  Chloride 98 - 111 mmol/L 105 106 102  CO2 22 - 32 mmol/L 24 23 27   Calcium 8.9 - 10.3 mg/dL 8.9 9.3 8.7(L)  Total Protein 6.5 - 8.1 g/dL 6.2(L) - -  Total Bilirubin 0.3 - 1.2  mg/dL 1.3(H) - -  Alkaline Phos 38 - 126 U/L 100 - -  AST 15 - 41 U/L 25 - -  ALT 0 - 44 U/L 21 - -     Lipid Panel No results found for: CHOL, HDL, LDLCALC, LDLDIRECT, TRIG, CHOLHDL    Wt Readings from Last 3 Encounters:  07/08/18 136 lb 6.4 oz (61.9 kg)  05/09/18 135 lb (61.2 kg)  08/25/17 136 lb (61.7 kg)     Other studies Reviewed: Additional studies/ records that were reviewed today include: Office notes, hospital records and testing.  ASSESSMENT AND PLAN:  1.  Acute diastolic CHF: She has volume  overload by exam and her PAS was elevated at 61 on recent echo. - As she is Lasix nave and takes very little medication, I will try low-dose Lasix and see how well that works.  Take it 3 days in a row day Wednesday and Friday only. -Supplement the Lasix with potassium -She will need a BMET in a week. -She was having lower extremity edema before she started the diltiazem, but if the edema does not improve, at some point the dilt may have to be changed.  2.  Persistent atrial fibrillation: She is getting some heart rate control from the diltiazem. - Continue Cardizem at the current dose for now. - If the diltiazem is felt to be causing the lower extremity edema, it will have to be changed to metoprolol  3.  Hypertension: -Her blood pressure is up some today, but I am not sure that she has taken her morning medications. - Additionally, the Lasix will decrease her blood pressure - Continue to follow on the Lasix - With her aortic stenosis, trying to avoid hypotension  4.  Aortic stenosis: -See echo report above.  Her valve area is quite small, but her gradients are not very high and her EF is normal. -Continue to follow for symptoms. -  given age, comorbidities, and frailty, not sure she would be a candidate for TAVR, not sure the family would want to pursue aggressive treatment    Current medicines are reviewed at length with the patient today.  The patient does not have  concerns regarding medicines.  The following changes have been made: Take Lasix for 3 days, then Monday Wednesday and Friday.  Start potassium.  Make sure she gets a BMET in a week  Labs/ tests ordered today include:   Orders Placed This Encounter  Procedures  . Basic metabolic panel     Disposition:   FU with Buford Dresser, MD  Signed, Rosaria Ferries, PA-C  07/08/2018 10:06 AM    Nichole [name] Phone: [phone]; Fax: [phone]

## 2018-07-12 DIAGNOSIS — G309 Alzheimer's disease, unspecified: Secondary | ICD-10-CM | POA: Diagnosis not present

## 2018-07-12 DIAGNOSIS — F0280 Dementia in other diseases classified elsewhere without behavioral disturbance: Secondary | ICD-10-CM | POA: Diagnosis not present

## 2018-07-12 DIAGNOSIS — Z8781 Personal history of (healed) traumatic fracture: Secondary | ICD-10-CM | POA: Diagnosis not present

## 2018-07-12 DIAGNOSIS — F0390 Unspecified dementia without behavioral disturbance: Secondary | ICD-10-CM | POA: Diagnosis not present

## 2018-07-12 DIAGNOSIS — I4891 Unspecified atrial fibrillation: Secondary | ICD-10-CM | POA: Diagnosis not present

## 2018-07-12 DIAGNOSIS — I5030 Unspecified diastolic (congestive) heart failure: Secondary | ICD-10-CM | POA: Diagnosis not present

## 2018-07-12 DIAGNOSIS — Z96651 Presence of right artificial knee joint: Secondary | ICD-10-CM | POA: Diagnosis not present

## 2018-07-12 DIAGNOSIS — Z7901 Long term (current) use of anticoagulants: Secondary | ICD-10-CM | POA: Diagnosis not present

## 2018-07-12 DIAGNOSIS — F4325 Adjustment disorder with mixed disturbance of emotions and conduct: Secondary | ICD-10-CM | POA: Diagnosis not present

## 2018-07-12 DIAGNOSIS — S72011D Unspecified intracapsular fracture of right femur, subsequent encounter for closed fracture with routine healing: Secondary | ICD-10-CM | POA: Diagnosis not present

## 2018-07-12 DIAGNOSIS — D500 Iron deficiency anemia secondary to blood loss (chronic): Secondary | ICD-10-CM | POA: Diagnosis not present

## 2018-07-12 DIAGNOSIS — E538 Deficiency of other specified B group vitamins: Secondary | ICD-10-CM | POA: Diagnosis not present

## 2018-07-13 DIAGNOSIS — D500 Iron deficiency anemia secondary to blood loss (chronic): Secondary | ICD-10-CM | POA: Diagnosis not present

## 2018-07-13 DIAGNOSIS — F0280 Dementia in other diseases classified elsewhere without behavioral disturbance: Secondary | ICD-10-CM | POA: Diagnosis not present

## 2018-07-13 DIAGNOSIS — S72011D Unspecified intracapsular fracture of right femur, subsequent encounter for closed fracture with routine healing: Secondary | ICD-10-CM | POA: Diagnosis not present

## 2018-07-13 DIAGNOSIS — G309 Alzheimer's disease, unspecified: Secondary | ICD-10-CM | POA: Diagnosis not present

## 2018-07-13 DIAGNOSIS — F4325 Adjustment disorder with mixed disturbance of emotions and conduct: Secondary | ICD-10-CM | POA: Diagnosis not present

## 2018-07-13 DIAGNOSIS — Z96651 Presence of right artificial knee joint: Secondary | ICD-10-CM | POA: Diagnosis not present

## 2018-07-15 DIAGNOSIS — F0280 Dementia in other diseases classified elsewhere without behavioral disturbance: Secondary | ICD-10-CM | POA: Diagnosis not present

## 2018-07-15 DIAGNOSIS — S72011D Unspecified intracapsular fracture of right femur, subsequent encounter for closed fracture with routine healing: Secondary | ICD-10-CM | POA: Diagnosis not present

## 2018-07-15 DIAGNOSIS — D500 Iron deficiency anemia secondary to blood loss (chronic): Secondary | ICD-10-CM | POA: Diagnosis not present

## 2018-07-15 DIAGNOSIS — F4325 Adjustment disorder with mixed disturbance of emotions and conduct: Secondary | ICD-10-CM | POA: Diagnosis not present

## 2018-07-15 DIAGNOSIS — Z96651 Presence of right artificial knee joint: Secondary | ICD-10-CM | POA: Diagnosis not present

## 2018-07-15 DIAGNOSIS — G309 Alzheimer's disease, unspecified: Secondary | ICD-10-CM | POA: Diagnosis not present

## 2018-07-18 DIAGNOSIS — S72011D Unspecified intracapsular fracture of right femur, subsequent encounter for closed fracture with routine healing: Secondary | ICD-10-CM | POA: Diagnosis not present

## 2018-07-18 DIAGNOSIS — F4325 Adjustment disorder with mixed disturbance of emotions and conduct: Secondary | ICD-10-CM | POA: Diagnosis not present

## 2018-07-18 DIAGNOSIS — Z96651 Presence of right artificial knee joint: Secondary | ICD-10-CM | POA: Diagnosis not present

## 2018-07-18 DIAGNOSIS — G309 Alzheimer's disease, unspecified: Secondary | ICD-10-CM | POA: Diagnosis not present

## 2018-07-18 DIAGNOSIS — F0280 Dementia in other diseases classified elsewhere without behavioral disturbance: Secondary | ICD-10-CM | POA: Diagnosis not present

## 2018-07-18 DIAGNOSIS — D500 Iron deficiency anemia secondary to blood loss (chronic): Secondary | ICD-10-CM | POA: Diagnosis not present

## 2018-07-19 DIAGNOSIS — S72011D Unspecified intracapsular fracture of right femur, subsequent encounter for closed fracture with routine healing: Secondary | ICD-10-CM | POA: Diagnosis not present

## 2018-07-19 DIAGNOSIS — Z96651 Presence of right artificial knee joint: Secondary | ICD-10-CM | POA: Diagnosis not present

## 2018-07-19 DIAGNOSIS — G309 Alzheimer's disease, unspecified: Secondary | ICD-10-CM | POA: Diagnosis not present

## 2018-07-19 DIAGNOSIS — D500 Iron deficiency anemia secondary to blood loss (chronic): Secondary | ICD-10-CM | POA: Diagnosis not present

## 2018-07-19 DIAGNOSIS — F0280 Dementia in other diseases classified elsewhere without behavioral disturbance: Secondary | ICD-10-CM | POA: Diagnosis not present

## 2018-07-19 DIAGNOSIS — F4325 Adjustment disorder with mixed disturbance of emotions and conduct: Secondary | ICD-10-CM | POA: Diagnosis not present

## 2018-07-20 DIAGNOSIS — G309 Alzheimer's disease, unspecified: Secondary | ICD-10-CM | POA: Diagnosis not present

## 2018-07-20 DIAGNOSIS — Z96651 Presence of right artificial knee joint: Secondary | ICD-10-CM | POA: Diagnosis not present

## 2018-07-20 DIAGNOSIS — D500 Iron deficiency anemia secondary to blood loss (chronic): Secondary | ICD-10-CM | POA: Diagnosis not present

## 2018-07-20 DIAGNOSIS — F0280 Dementia in other diseases classified elsewhere without behavioral disturbance: Secondary | ICD-10-CM | POA: Diagnosis not present

## 2018-07-20 DIAGNOSIS — S72011D Unspecified intracapsular fracture of right femur, subsequent encounter for closed fracture with routine healing: Secondary | ICD-10-CM | POA: Diagnosis not present

## 2018-07-20 DIAGNOSIS — F4325 Adjustment disorder with mixed disturbance of emotions and conduct: Secondary | ICD-10-CM | POA: Diagnosis not present

## 2018-07-25 DIAGNOSIS — G309 Alzheimer's disease, unspecified: Secondary | ICD-10-CM | POA: Diagnosis not present

## 2018-07-25 DIAGNOSIS — D500 Iron deficiency anemia secondary to blood loss (chronic): Secondary | ICD-10-CM | POA: Diagnosis not present

## 2018-07-25 DIAGNOSIS — F4325 Adjustment disorder with mixed disturbance of emotions and conduct: Secondary | ICD-10-CM | POA: Diagnosis not present

## 2018-07-25 DIAGNOSIS — F0280 Dementia in other diseases classified elsewhere without behavioral disturbance: Secondary | ICD-10-CM | POA: Diagnosis not present

## 2018-07-25 DIAGNOSIS — S72011D Unspecified intracapsular fracture of right femur, subsequent encounter for closed fracture with routine healing: Secondary | ICD-10-CM | POA: Diagnosis not present

## 2018-07-25 DIAGNOSIS — Z96651 Presence of right artificial knee joint: Secondary | ICD-10-CM | POA: Diagnosis not present

## 2018-07-27 DIAGNOSIS — F4325 Adjustment disorder with mixed disturbance of emotions and conduct: Secondary | ICD-10-CM | POA: Diagnosis not present

## 2018-07-27 DIAGNOSIS — Z96651 Presence of right artificial knee joint: Secondary | ICD-10-CM | POA: Diagnosis not present

## 2018-07-27 DIAGNOSIS — G309 Alzheimer's disease, unspecified: Secondary | ICD-10-CM | POA: Diagnosis not present

## 2018-07-27 DIAGNOSIS — F0280 Dementia in other diseases classified elsewhere without behavioral disturbance: Secondary | ICD-10-CM | POA: Diagnosis not present

## 2018-07-27 DIAGNOSIS — S72011D Unspecified intracapsular fracture of right femur, subsequent encounter for closed fracture with routine healing: Secondary | ICD-10-CM | POA: Diagnosis not present

## 2018-07-27 DIAGNOSIS — D500 Iron deficiency anemia secondary to blood loss (chronic): Secondary | ICD-10-CM | POA: Diagnosis not present

## 2018-07-29 DIAGNOSIS — F4325 Adjustment disorder with mixed disturbance of emotions and conduct: Secondary | ICD-10-CM | POA: Diagnosis not present

## 2018-07-29 DIAGNOSIS — Z96651 Presence of right artificial knee joint: Secondary | ICD-10-CM | POA: Diagnosis not present

## 2018-07-29 DIAGNOSIS — S72011D Unspecified intracapsular fracture of right femur, subsequent encounter for closed fracture with routine healing: Secondary | ICD-10-CM | POA: Diagnosis not present

## 2018-07-29 DIAGNOSIS — F0280 Dementia in other diseases classified elsewhere without behavioral disturbance: Secondary | ICD-10-CM | POA: Diagnosis not present

## 2018-07-29 DIAGNOSIS — G309 Alzheimer's disease, unspecified: Secondary | ICD-10-CM | POA: Diagnosis not present

## 2018-07-29 DIAGNOSIS — D500 Iron deficiency anemia secondary to blood loss (chronic): Secondary | ICD-10-CM | POA: Diagnosis not present

## 2018-08-02 DIAGNOSIS — G309 Alzheimer's disease, unspecified: Secondary | ICD-10-CM | POA: Diagnosis not present

## 2018-08-02 DIAGNOSIS — F0280 Dementia in other diseases classified elsewhere without behavioral disturbance: Secondary | ICD-10-CM | POA: Diagnosis not present

## 2018-08-02 DIAGNOSIS — F4325 Adjustment disorder with mixed disturbance of emotions and conduct: Secondary | ICD-10-CM | POA: Diagnosis not present

## 2018-08-02 DIAGNOSIS — Z96651 Presence of right artificial knee joint: Secondary | ICD-10-CM | POA: Diagnosis not present

## 2018-08-02 DIAGNOSIS — S72011D Unspecified intracapsular fracture of right femur, subsequent encounter for closed fracture with routine healing: Secondary | ICD-10-CM | POA: Diagnosis not present

## 2018-08-02 DIAGNOSIS — D500 Iron deficiency anemia secondary to blood loss (chronic): Secondary | ICD-10-CM | POA: Diagnosis not present

## 2018-08-03 DIAGNOSIS — D500 Iron deficiency anemia secondary to blood loss (chronic): Secondary | ICD-10-CM | POA: Diagnosis not present

## 2018-08-03 DIAGNOSIS — F4325 Adjustment disorder with mixed disturbance of emotions and conduct: Secondary | ICD-10-CM | POA: Diagnosis not present

## 2018-08-03 DIAGNOSIS — G309 Alzheimer's disease, unspecified: Secondary | ICD-10-CM | POA: Diagnosis not present

## 2018-08-03 DIAGNOSIS — S72011D Unspecified intracapsular fracture of right femur, subsequent encounter for closed fracture with routine healing: Secondary | ICD-10-CM | POA: Diagnosis not present

## 2018-08-03 DIAGNOSIS — Z96651 Presence of right artificial knee joint: Secondary | ICD-10-CM | POA: Diagnosis not present

## 2018-08-03 DIAGNOSIS — F0280 Dementia in other diseases classified elsewhere without behavioral disturbance: Secondary | ICD-10-CM | POA: Diagnosis not present

## 2018-08-05 ENCOUNTER — Other Ambulatory Visit: Payer: Self-pay | Admitting: Family Medicine

## 2018-08-05 ENCOUNTER — Ambulatory Visit
Admission: RE | Admit: 2018-08-05 | Discharge: 2018-08-05 | Disposition: A | Discharge status: Home / Self Care | Payer: Medicare Other | Source: Ambulatory Visit | Attending: Family Medicine | Admitting: Family Medicine

## 2018-08-05 DIAGNOSIS — S0003XA Contusion of scalp, initial encounter: Secondary | ICD-10-CM | POA: Diagnosis not present

## 2018-08-05 DIAGNOSIS — S0093XS Contusion of unspecified part of head, sequela: Secondary | ICD-10-CM

## 2018-08-05 DIAGNOSIS — Z7901 Long term (current) use of anticoagulants: Secondary | ICD-10-CM | POA: Diagnosis not present

## 2018-08-05 DIAGNOSIS — I4891 Unspecified atrial fibrillation: Secondary | ICD-10-CM | POA: Diagnosis not present

## 2018-08-05 DIAGNOSIS — S0093XA Contusion of unspecified part of head, initial encounter: Secondary | ICD-10-CM | POA: Diagnosis not present

## 2018-08-06 DIAGNOSIS — F4325 Adjustment disorder with mixed disturbance of emotions and conduct: Secondary | ICD-10-CM | POA: Diagnosis not present

## 2018-08-06 DIAGNOSIS — Z96651 Presence of right artificial knee joint: Secondary | ICD-10-CM | POA: Diagnosis not present

## 2018-08-06 DIAGNOSIS — G309 Alzheimer's disease, unspecified: Secondary | ICD-10-CM | POA: Diagnosis not present

## 2018-08-06 DIAGNOSIS — D500 Iron deficiency anemia secondary to blood loss (chronic): Secondary | ICD-10-CM | POA: Diagnosis not present

## 2018-08-06 DIAGNOSIS — F0280 Dementia in other diseases classified elsewhere without behavioral disturbance: Secondary | ICD-10-CM | POA: Diagnosis not present

## 2018-08-06 DIAGNOSIS — S72011D Unspecified intracapsular fracture of right femur, subsequent encounter for closed fracture with routine healing: Secondary | ICD-10-CM | POA: Diagnosis not present

## 2018-08-08 DIAGNOSIS — I4891 Unspecified atrial fibrillation: Secondary | ICD-10-CM | POA: Diagnosis not present

## 2018-08-08 DIAGNOSIS — G309 Alzheimer's disease, unspecified: Secondary | ICD-10-CM | POA: Diagnosis not present

## 2018-08-08 DIAGNOSIS — Z7901 Long term (current) use of anticoagulants: Secondary | ICD-10-CM | POA: Diagnosis not present

## 2018-08-08 DIAGNOSIS — F4325 Adjustment disorder with mixed disturbance of emotions and conduct: Secondary | ICD-10-CM | POA: Diagnosis not present

## 2018-08-08 DIAGNOSIS — Z9181 History of falling: Secondary | ICD-10-CM | POA: Diagnosis not present

## 2018-08-08 DIAGNOSIS — F0280 Dementia in other diseases classified elsewhere without behavioral disturbance: Secondary | ICD-10-CM | POA: Diagnosis not present

## 2018-08-08 DIAGNOSIS — I509 Heart failure, unspecified: Secondary | ICD-10-CM | POA: Diagnosis not present

## 2018-08-08 DIAGNOSIS — Z96651 Presence of right artificial knee joint: Secondary | ICD-10-CM | POA: Diagnosis not present

## 2018-08-08 DIAGNOSIS — Z8673 Personal history of transient ischemic attack (TIA), and cerebral infarction without residual deficits: Secondary | ICD-10-CM | POA: Diagnosis not present

## 2018-08-10 DIAGNOSIS — F4325 Adjustment disorder with mixed disturbance of emotions and conduct: Secondary | ICD-10-CM | POA: Diagnosis not present

## 2018-08-10 DIAGNOSIS — G309 Alzheimer's disease, unspecified: Secondary | ICD-10-CM | POA: Diagnosis not present

## 2018-08-10 DIAGNOSIS — I4891 Unspecified atrial fibrillation: Secondary | ICD-10-CM | POA: Diagnosis not present

## 2018-08-10 DIAGNOSIS — F0280 Dementia in other diseases classified elsewhere without behavioral disturbance: Secondary | ICD-10-CM | POA: Diagnosis not present

## 2018-08-10 DIAGNOSIS — Z7901 Long term (current) use of anticoagulants: Secondary | ICD-10-CM | POA: Diagnosis not present

## 2018-08-10 DIAGNOSIS — I509 Heart failure, unspecified: Secondary | ICD-10-CM | POA: Diagnosis not present

## 2018-08-11 ENCOUNTER — Ambulatory Visit (INDEPENDENT_AMBULATORY_CARE_PROVIDER_SITE_OTHER): Payer: Medicare Other | Admitting: Cardiology

## 2018-08-11 ENCOUNTER — Encounter: Payer: Self-pay | Admitting: Cardiology

## 2018-08-11 VITALS — BP 102/54 | HR 102 | Ht 64.0 in | Wt 131.4 lb

## 2018-08-11 DIAGNOSIS — I5032 Chronic diastolic (congestive) heart failure: Secondary | ICD-10-CM

## 2018-08-11 DIAGNOSIS — W19XXXS Unspecified fall, sequela: Secondary | ICD-10-CM | POA: Diagnosis not present

## 2018-08-11 DIAGNOSIS — G301 Alzheimer's disease with late onset: Secondary | ICD-10-CM | POA: Diagnosis not present

## 2018-08-11 DIAGNOSIS — F0280 Dementia in other diseases classified elsewhere without behavioral disturbance: Secondary | ICD-10-CM | POA: Diagnosis not present

## 2018-08-11 DIAGNOSIS — I480 Paroxysmal atrial fibrillation: Secondary | ICD-10-CM | POA: Diagnosis not present

## 2018-08-11 MED ORDER — FUROSEMIDE 20 MG PO TABS: ORAL_TABLET | ORAL | 3 refills | Status: DC

## 2018-08-11 MED ORDER — POTASSIUM CHLORIDE ER 10 MEQ PO TBCR: 10.0000 meq | EXTENDED_RELEASE_TABLET | Freq: Every day | ORAL | 3 refills | Status: DC | PRN

## 2018-08-11 NOTE — Patient Instructions (Signed)
Medication Instructions:  Stop: Eliquis 2.5 mg Change: Lasix 20 mg as needed     Potassium 10 meq as needed If you need a refill on your cardiac medications before your next appointment, please call your pharmacy.   Lab work: None  Testing/Procedures:   Follow-Up: At [facility], you and your health needs are our priority.  As part of our continuing mission to provide you with exceptional heart care, we have created designated Provider Care Teams.  These Care Teams include your primary Cardiologist (physician) and Advanced Practice Providers (APPs -  Physician Assistants and Nurse Practitioners) who all work together to provide you with the care you need, when you need it. You will need a follow up appointment in 6 months.  Please call our office 2 months in advance to schedule this appointment.  You may see Buford Dresser, MD or one of the following Advanced Practice Providers on your designated Care Team:   Rosaria Ferries, PA-C . Jory Sims, DNP, ANP

## 2018-08-11 NOTE — Progress Notes (Signed)
Cardiology Office Note:    Date:  08/11/2018   ID:  Nichole [name], DOB 09-May-1923, MRN [id number]  PCP:  Aretta Nip, MD  Cardiologist:  Buford Dresser, MD PhD  Referring MD: Aretta Nip, MD   CC: follow up  History of Present Illness:    Nichole [name] is a 85 y.o. female with a hx of Alzheimer's, TIA, atrial fibrillation who is seen as a new consult at the request of Rankins, Bill Salinas, MD for the evaluation and management of atrial fibrillation. She was last seen by Rosaria Ferries on 07/08/18, and I saw her 06/22/18 in the hospital.   She was newly diagnosed with atrial fibrillation while in the hospital in 06/2018. She is managed on diltiazem and apixaban. Echo also showed aortic stenosis.  At her follow up with Suanne Marker, she had LE edema with weeping but does not like compression stockings. She is in independent living, but family helps her with medications. She was started on low dose lasix.   Today: has a large bruise over her right temple. Does not remember falling. Went to physician, had CT head which was negative for intracranial bleed. CV=6 today based on treatment for diastolic heart failure. Discussed that this is about a 10% risk per year of stroke. We discussed at length with family members the risk of stroke and the risk of life threatening bleeding. Had a long discussion about pros/cons. Given that she has multiple unexplained bruises suggestive of falls, in addition to large bruise on her forehead from recent fall, the decision is to stop anticoagulation at this time. Family understands risks of stroke.  Past Medical History:  Diagnosis Date  . Alzheimer disease ([place]) 08/25/2017  . Breast CA ([place])   . HTN (hypertension)   . Persistent atrial fibrillation 05/2018  . TIA (transient ischemic attack) 2009    Past Surgical History:  Procedure Laterality Date  . ANTERIOR APPROACH HEMI HIP ARTHROPLASTY Right 05/10/2018   Procedure: ANTERIOR APPROACH  HEMI HIP ARTHROPLASTY;  Surgeon: Rod Can, MD;  Location: [facility];  Service: Orthopedics;  Laterality: Right;  . BREAST SURGERY    . MASTECTOMY Left 1995    Current Medications: Current Outpatient Medications on File Prior to Visit  Medication Sig  . diltiazem (CARDIZEM CD) 120 MG 24 hr capsule Take 1 capsule (120 mg total) by mouth daily.  Marland Kitchen donepezil (ARICEPT) 5 MG tablet Take 5 mg by mouth 2 (two) times a week.    No current facility-administered medications on file prior to visit.      Allergies:   Tape   Social History   Socioeconomic History  . Marital status: Widowed    Spouse name: Not on file  . Number of children: 2  . Years of education: 68  . Highest education level: Not on file  Occupational History  . Occupation: Retired  Scientific laboratory technician  . Financial resource strain: Not on file  . Food insecurity:    Worry: Not on file    Inability: Not on file  . Transportation needs:    Medical: Not on file    Non-medical: Not on file  Tobacco Use  . Smoking status: Never Smoker  . Smokeless tobacco: Never Used  Substance and Sexual Activity  . Alcohol use: No  . Drug use: No  . Sexual activity: Never    Birth control/protection: None  Lifestyle  . Physical activity:    Days per week: Not on file    Minutes per session:  Not on file  . Stress: Not on file  Relationships  . Social connections:    Talks on phone: Not on file    Gets together: Not on file    Attends religious service: Not on file    Active member of club or organization: Not on file    Attends meetings of clubs or organizations: Not on file    Relationship status: Not on file  Other Topics Concern  . Not on file  Social History Narrative   Granddaughter live with her   Caffeine use: 1 serving per day, 1 coke per day   Right handed      Family History: The patient's family history includes AAA (abdominal aortic aneurysm) in her mother; Breast cancer in her sister; Colon cancer in her  mother; Coronary artery disease in her father; Ovarian cancer in her sister.  ROS:   Please see the history of present illness.  Additional pertinent ROS:  Constitutional: Negative for chills, fever, night sweats, unintentional weight loss  HENT: Negative for ear pain and hearing loss.   Eyes: Negative for loss of vision and eye pain.  Respiratory: Negative for cough, sputum, shortness of breath, wheezing.   Cardiovascular: See HPI. Gastrointestinal: Negative for abdominal pain, melena, and hematochezia.  Genitourinary: Negative for dysuria and hematuria.  Musculoskeletal: Negative for falls and myalgias.  Skin: Negative for itching and rash.  Neurological: Negative for focal weakness, focal sensory changes and loss of consciousness.  Endo/Heme/Allergies: Does not bruise/bleed easily.    EKGs/Labs/Other Studies Reviewed:    The following studies were reviewed today: Echo 06/22/18 - Left ventricle: The cavity size was normal. Wall thickness was   normal. Systolic function was normal. The estimated ejection   fraction was in the range of 55% to 60%. - Aortic valve: There was mild stenosis. There was mild   regurgitation. Valve area (VTI): 0.6 cm^2. Valve area (Vmax):   0.67 cm^2. Valve area (Vmean): 0.63 cm^2. - Mitral valve: Calcified annulus. There was moderate   regurgitation. Valve area by continuity equation (using LVOT   flow): 0.54 cm^2. - Left atrium: The atrium was moderately dilated. - Right atrium: The atrium was mildly dilated. - Atrial septum: No defect or patent foramen ovale was identified. - Tricuspid valve: There was severe regurgitation. - Pulmonary arteries: PA peak pressure: 61 mm Hg (S).  EKG:  EKG is personally reviewed.  The ekg ordered today demonstrates atrial fibrillation, HR 102  Recent Labs: 06/21/2018: B Natriuretic Peptide 534.6; Magnesium 2.4; TSH 3.696 06/22/2018: ALT 21; BUN 26; Creatinine, Ser 1.13; Hemoglobin 10.6; Platelets 247; Potassium  4.0; Sodium 140  Recent Lipid Panel No results found for: CHOL, TRIG, HDL, CHOLHDL, VLDL, LDLCALC, LDLDIRECT   Reviewed [facility] from [facility], Cr stable  Physical Exam:    VS:  BP (!) 102/54   Pulse (!) 102   Ht 5\' 4"  (1.626 m)   Wt 131 lb 6.4 oz (59.6 kg)   BMI 22.55 kg/m     Wt Readings from Last 3 Encounters:  08/11/18 131 lb 6.4 oz (59.6 kg)  07/08/18 136 lb 6.4 oz (61.9 kg)  05/09/18 135 lb (61.2 kg)     GEN: frail appearing, healing bruise over right temple, multiple bruises over both arms included large bruises over both elbows HEENT: Normal NECK: No JVD; No carotid bruits LYMPHATICS: No lymphadenopathy CARDIAC: irregularly irregular rhythm, normal S1 and S2, no rubs, gallops. 2/6 SEM at RUSB. Radial and DP pulses 2+ bilaterally. RESPIRATORY:  Clear to auscultation without rales, wheezing or rhonchi  ABDOMEN: Soft, non-tender, non-distended MUSCULOSKELETAL:  Trace bilateral LE edema; No deformity  SKIN: Warm and dry NEUROLOGIC:  Alert, not oriented, poor historian PSYCHIATRIC:  Normal affect   ASSESSMENT:    1. Paroxysmal atrial fibrillation (HCC)   2. Chronic diastolic (congestive) heart failure ([place])   3. Fall, sequela   4. Late onset Alzheimer's disease without behavioral disturbance ([place])    PLAN:    1. Paroxysmal atrial fibrillation, but history of major fall: had a discussion with family at length about risk of stroke (Chadsvasc=6, so 10% per year risk) versus risk of life threatening bleeding with falls. Discussed at length today. Given that she does not remember falling, did not call for help, and had serious head bruise, family agrees to stop anticoagulation. They are aware of the risk of stroke. If her likelihood of falls changes, we can always reassess risk of anticoagulation in the future. -continue diltiazem for rate control -given her severe memory issues with her Alzheimer's disease, she is at increased risk of falls. Recommend continued home health  for assistance  2. Chronic diastolic heart failure: edema improved, no JVD today.   -changed to weight and symptom based furosemide dosing, with potassium to be taken when furosemide taken  Plan for follow up: 6 mos or sooner as needed, given that it is very difficult to get her out for doctor's visits  TIME SPENT WITH PATIENT: >40 minutes of direct patient care. More than 50% of that time was spent on coordination of care and counseling regarding risks/benefits of anticoagulation with recent falls and high chadsvasc score.  Buford Dresser, MD, PhD [place]  [facility]   Medication Adjustments/Labs and Tests Ordered: Current medicines are reviewed at length with the patient today.  Concerns regarding medicines are outlined above.  Orders Placed This Encounter  Procedures  . EKG 12-Lead   Meds ordered this encounter  Medications  . furosemide (LASIX) 20 MG tablet    Sig: Take one dose for leg swelling or weight gain of more than three pounds as needed.    Dispense:  90 tablet    Refill:  3  . potassium chloride (K-DUR) 10 MEQ tablet    Sig: Take 1 tablet (10 mEq total) by mouth daily as needed. When furosemide is taken    Dispense:  90 tablet    Refill:  3    Patient Instructions  Medication Instructions:  Stop: Eliquis 2.5 mg Change: Lasix 20 mg as needed     Potassium 10 meq as needed If you need a refill on your cardiac medications before your next appointment, please call your pharmacy.   Lab work: None  Testing/Procedures:   Follow-Up: At [facility], you and your health needs are our priority.  As part of our continuing mission to provide you with exceptional heart care, we have created designated Provider Care Teams.  These Care Teams include your primary Cardiologist (physician) and Advanced Practice Providers (APPs -  Physician Assistants and Nurse Practitioners) who all work together to provide you with the care you need, when you need it. You  will need a follow up appointment in 6 months.  Please call our office 2 months in advance to schedule this appointment.  You may see Buford Dresser, MD or one of the following Advanced Practice Providers on your designated Care Team:   Rosaria Ferries, PA-C . Jory Sims, DNP, ANP  Signed, Buford Dresser, MD PhD 08/11/2018 1:27 PM    [place]

## 2018-08-22 ENCOUNTER — Telehealth: Payer: Self-pay | Admitting: Cardiology

## 2018-08-22 NOTE — Telephone Encounter (Signed)
Clint Lipps called from [facility]) called to verify medications for the pt. Debbie got information from the patient's Daughter about some medications added/discontinued, but Jackelyn Poling wants to verify with the Cardiologist. Jackelyn Poling is only seeing the patient 1x a week.    Pt is being monitored for Edema/SOB/ Heart Irregularity/possible Oncoming CHF  Debbie would like the latest office note faxed to her at: [phone]

## 2018-08-22 NOTE — Telephone Encounter (Signed)
LEFT DETAILED MESSAGE AS REQUESTED WITH MEDICATIONS AND DOSES

## 2018-08-26 ENCOUNTER — Telehealth: Payer: Self-pay | Admitting: Cardiology

## 2018-08-26 NOTE — Telephone Encounter (Signed)
Have her take furosemide 20 mg qd x 3 days, then back to prn.  She will need to take the potassium with each dose.  Daily weights if possible and call back next week if still edematous

## 2018-08-26 NOTE — Telephone Encounter (Signed)
  Pt c/o swelling: STAT is pt has developed SOB within 24 hours  1) How much weight have you gained and in what time span? Don't know  2) If swelling, where is the swelling located? Right leg 2-3 plus pitting, left leg 2 plus but not pitting  3) Are you currently taking a fluid pill? yes  4) Are you currently SOB? no  5) Do you have a log of your daily weights (if so, list)? no  6) Have you gained 3 pounds in a day or 5 pounds in a week? NA  7) Have you traveled recently? no   Lungs sounds are diminished on the left side, BP 160/90. They should have a scale next week. Would like advice on lasix. Can leave a detailed message on voicemail. It is confidential.

## 2018-08-26 NOTE — Telephone Encounter (Signed)
Can you please advise.  I attempted to contact Nichole [name] with [facility], she did no answer, I left a voicemail. Patient is having pitting edema, and swelling in legs. She is currently taking lasix 20 PRN, they are unsure of any weight gain.  Patient labs: 07/21/2018- Creatinine 1.08 BUN- 18 Non GFR- 44 GFR- 51

## 2018-08-26 NOTE — Telephone Encounter (Signed)
Called, spoke with Debbie.  They will go by the information from PharmD, and will get as many weights as they can, and will call back next week if the same issues persist.

## 2018-09-07 DIAGNOSIS — Z7901 Long term (current) use of anticoagulants: Secondary | ICD-10-CM | POA: Diagnosis not present

## 2018-09-07 DIAGNOSIS — Z96651 Presence of right artificial knee joint: Secondary | ICD-10-CM | POA: Diagnosis not present

## 2018-09-07 DIAGNOSIS — Z9181 History of falling: Secondary | ICD-10-CM | POA: Diagnosis not present

## 2018-09-07 DIAGNOSIS — I509 Heart failure, unspecified: Secondary | ICD-10-CM | POA: Diagnosis not present

## 2018-09-07 DIAGNOSIS — Z8673 Personal history of transient ischemic attack (TIA), and cerebral infarction without residual deficits: Secondary | ICD-10-CM | POA: Diagnosis not present

## 2018-09-07 DIAGNOSIS — I4891 Unspecified atrial fibrillation: Secondary | ICD-10-CM | POA: Diagnosis not present

## 2018-09-07 DIAGNOSIS — G309 Alzheimer's disease, unspecified: Secondary | ICD-10-CM | POA: Diagnosis not present

## 2018-09-07 DIAGNOSIS — F0280 Dementia in other diseases classified elsewhere without behavioral disturbance: Secondary | ICD-10-CM | POA: Diagnosis not present

## 2018-09-07 DIAGNOSIS — F4325 Adjustment disorder with mixed disturbance of emotions and conduct: Secondary | ICD-10-CM | POA: Diagnosis not present

## 2018-09-08 DIAGNOSIS — Z7901 Long term (current) use of anticoagulants: Secondary | ICD-10-CM | POA: Diagnosis not present

## 2018-09-08 DIAGNOSIS — I4891 Unspecified atrial fibrillation: Secondary | ICD-10-CM | POA: Diagnosis not present

## 2018-09-08 DIAGNOSIS — G309 Alzheimer's disease, unspecified: Secondary | ICD-10-CM | POA: Diagnosis not present

## 2018-09-08 DIAGNOSIS — F4325 Adjustment disorder with mixed disturbance of emotions and conduct: Secondary | ICD-10-CM | POA: Diagnosis not present

## 2018-09-08 DIAGNOSIS — I509 Heart failure, unspecified: Secondary | ICD-10-CM | POA: Diagnosis not present

## 2018-09-08 DIAGNOSIS — F0280 Dementia in other diseases classified elsewhere without behavioral disturbance: Secondary | ICD-10-CM | POA: Diagnosis not present

## 2018-10-18 ENCOUNTER — Other Ambulatory Visit: Payer: Self-pay | Admitting: Cardiology

## 2018-10-18 MED ORDER — DILTIAZEM HCL ER COATED BEADS 120 MG PO CP24: 120.0000 mg | ORAL_CAPSULE | Freq: Every day | ORAL | 2 refills | Status: DC

## 2018-10-18 NOTE — Telephone Encounter (Signed)
Diltiazem refilled. 

## 2018-10-18 NOTE — Telephone Encounter (Signed)
New Message    *STAT* If patient is at the pharmacy, call can be transferred to refill team.   1. Which medications need to be refilled? (please list name of each medication and dose if known) Diltiazem   2. Which pharmacy/location (including street and city if local pharmacy) is medication to be sent to? [facility] on [address]   3. Do they need a 30 day or 90 day supply? 90 day supply

## 2018-11-11 ENCOUNTER — Encounter

## 2018-11-11 ENCOUNTER — Ambulatory Visit: Payer: Self-pay | Admitting: Neurology

## 2019-01-09 ENCOUNTER — Other Ambulatory Visit: Payer: Self-pay | Admitting: Cardiology

## 2019-01-31 ENCOUNTER — Emergency Department (HOSPITAL_COMMUNITY): Payer: Medicare Other

## 2019-01-31 ENCOUNTER — Other Ambulatory Visit: Payer: Self-pay

## 2019-01-31 ENCOUNTER — Encounter (HOSPITAL_COMMUNITY): Payer: Self-pay | Admitting: Obstetrics and Gynecology

## 2019-01-31 ENCOUNTER — Inpatient Hospital Stay (HOSPITAL_COMMUNITY)
Admission: EM | Admit: 2019-01-31 | Discharge: 2019-02-03 | DRG: 469 | Disposition: A | Discharge status: Skilled Nursing Facility | Payer: Medicare Other | Source: Skilled Nursing Facility | Attending: Internal Medicine | Admitting: Internal Medicine

## 2019-01-31 DIAGNOSIS — S72002A Fracture of unspecified part of neck of left femur, initial encounter for closed fracture: Secondary | ICD-10-CM | POA: Diagnosis present

## 2019-01-31 DIAGNOSIS — Z91048 Other nonmedicinal substance allergy status: Secondary | ICD-10-CM

## 2019-01-31 DIAGNOSIS — Z9012 Acquired absence of left breast and nipple: Secondary | ICD-10-CM | POA: Diagnosis not present

## 2019-01-31 DIAGNOSIS — Z800 Family history of malignant neoplasm of digestive organs: Secondary | ICD-10-CM | POA: Diagnosis not present

## 2019-01-31 DIAGNOSIS — L89151 Pressure ulcer of sacral region, stage 1: Secondary | ICD-10-CM | POA: Diagnosis present

## 2019-01-31 DIAGNOSIS — Z96643 Presence of artificial hip joint, bilateral: Secondary | ICD-10-CM | POA: Diagnosis not present

## 2019-01-31 DIAGNOSIS — Z79899 Other long term (current) drug therapy: Secondary | ICD-10-CM

## 2019-01-31 DIAGNOSIS — R911 Solitary pulmonary nodule: Secondary | ICD-10-CM | POA: Diagnosis present

## 2019-01-31 DIAGNOSIS — Z03818 Encounter for observation for suspected exposure to other biological agents ruled out: Secondary | ICD-10-CM | POA: Diagnosis not present

## 2019-01-31 DIAGNOSIS — W19XXXD Unspecified fall, subsequent encounter: Secondary | ICD-10-CM | POA: Diagnosis not present

## 2019-01-31 DIAGNOSIS — J9601 Acute respiratory failure with hypoxia: Secondary | ICD-10-CM | POA: Diagnosis not present

## 2019-01-31 DIAGNOSIS — R296 Repeated falls: Secondary | ICD-10-CM | POA: Diagnosis present

## 2019-01-31 DIAGNOSIS — Z8673 Personal history of transient ischemic attack (TIA), and cerebral infarction without residual deficits: Secondary | ICD-10-CM

## 2019-01-31 DIAGNOSIS — G301 Alzheimer's disease with late onset: Secondary | ICD-10-CM | POA: Diagnosis not present

## 2019-01-31 DIAGNOSIS — G309 Alzheimer's disease, unspecified: Secondary | ICD-10-CM | POA: Diagnosis present

## 2019-01-31 DIAGNOSIS — W1830XA Fall on same level, unspecified, initial encounter: Secondary | ICD-10-CM | POA: Diagnosis present

## 2019-01-31 DIAGNOSIS — S0990XA Unspecified injury of head, initial encounter: Secondary | ICD-10-CM | POA: Diagnosis not present

## 2019-01-31 DIAGNOSIS — S199XXA Unspecified injury of neck, initial encounter: Secondary | ICD-10-CM | POA: Diagnosis not present

## 2019-01-31 DIAGNOSIS — Z853 Personal history of malignant neoplasm of breast: Secondary | ICD-10-CM | POA: Diagnosis not present

## 2019-01-31 DIAGNOSIS — I10 Essential (primary) hypertension: Secondary | ICD-10-CM | POA: Diagnosis not present

## 2019-01-31 DIAGNOSIS — R52 Pain, unspecified: Secondary | ICD-10-CM | POA: Diagnosis not present

## 2019-01-31 DIAGNOSIS — Z803 Family history of malignant neoplasm of breast: Secondary | ICD-10-CM | POA: Diagnosis not present

## 2019-01-31 DIAGNOSIS — IMO0001 Reserved for inherently not codable concepts without codable children: Secondary | ICD-10-CM | POA: Diagnosis present

## 2019-01-31 DIAGNOSIS — S7292XA Unspecified fracture of left femur, initial encounter for closed fracture: Secondary | ICD-10-CM | POA: Diagnosis not present

## 2019-01-31 DIAGNOSIS — Z7401 Bed confinement status: Secondary | ICD-10-CM | POA: Diagnosis not present

## 2019-01-31 DIAGNOSIS — Z96641 Presence of right artificial hip joint: Secondary | ICD-10-CM | POA: Diagnosis present

## 2019-01-31 DIAGNOSIS — M25552 Pain in left hip: Secondary | ICD-10-CM | POA: Diagnosis not present

## 2019-01-31 DIAGNOSIS — Z8041 Family history of malignant neoplasm of ovary: Secondary | ICD-10-CM

## 2019-01-31 DIAGNOSIS — Z471 Aftercare following joint replacement surgery: Secondary | ICD-10-CM | POA: Diagnosis not present

## 2019-01-31 DIAGNOSIS — Y92129 Unspecified place in nursing home as the place of occurrence of the external cause: Secondary | ICD-10-CM | POA: Diagnosis not present

## 2019-01-31 DIAGNOSIS — R0902 Hypoxemia: Secondary | ICD-10-CM | POA: Diagnosis not present

## 2019-01-31 DIAGNOSIS — N183 Chronic kidney disease, stage 3 unspecified: Secondary | ICD-10-CM | POA: Diagnosis present

## 2019-01-31 DIAGNOSIS — E861 Hypovolemia: Secondary | ICD-10-CM | POA: Diagnosis present

## 2019-01-31 DIAGNOSIS — M2550 Pain in unspecified joint: Secondary | ICD-10-CM | POA: Diagnosis not present

## 2019-01-31 DIAGNOSIS — S72012A Unspecified intracapsular fracture of left femur, initial encounter for closed fracture: Secondary | ICD-10-CM | POA: Diagnosis not present

## 2019-01-31 DIAGNOSIS — E871 Hypo-osmolality and hyponatremia: Secondary | ICD-10-CM | POA: Diagnosis present

## 2019-01-31 DIAGNOSIS — Z01811 Encounter for preprocedural respiratory examination: Secondary | ICD-10-CM | POA: Diagnosis not present

## 2019-01-31 DIAGNOSIS — S72002D Fracture of unspecified part of neck of left femur, subsequent encounter for closed fracture with routine healing: Secondary | ICD-10-CM | POA: Diagnosis not present

## 2019-01-31 DIAGNOSIS — Z96642 Presence of left artificial hip joint: Secondary | ICD-10-CM | POA: Diagnosis not present

## 2019-01-31 DIAGNOSIS — I4819 Other persistent atrial fibrillation: Secondary | ICD-10-CM | POA: Diagnosis present

## 2019-01-31 DIAGNOSIS — I5033 Acute on chronic diastolic (congestive) heart failure: Secondary | ICD-10-CM | POA: Diagnosis present

## 2019-01-31 DIAGNOSIS — Z8249 Family history of ischemic heart disease and other diseases of the circulatory system: Secondary | ICD-10-CM

## 2019-01-31 DIAGNOSIS — I130 Hypertensive heart and chronic kidney disease with heart failure and stage 1 through stage 4 chronic kidney disease, or unspecified chronic kidney disease: Secondary | ICD-10-CM | POA: Diagnosis present

## 2019-01-31 DIAGNOSIS — J90 Pleural effusion, not elsewhere classified: Secondary | ICD-10-CM | POA: Diagnosis not present

## 2019-01-31 DIAGNOSIS — L8990 Pressure ulcer of unspecified site, unspecified stage: Secondary | ICD-10-CM | POA: Insufficient documentation

## 2019-01-31 DIAGNOSIS — J811 Chronic pulmonary edema: Secondary | ICD-10-CM | POA: Diagnosis not present

## 2019-01-31 DIAGNOSIS — R2689 Other abnormalities of gait and mobility: Secondary | ICD-10-CM | POA: Diagnosis not present

## 2019-01-31 DIAGNOSIS — F0280 Dementia in other diseases classified elsewhere without behavioral disturbance: Secondary | ICD-10-CM | POA: Diagnosis present

## 2019-01-31 DIAGNOSIS — S72042A Displaced fracture of base of neck of left femur, initial encounter for closed fracture: Secondary | ICD-10-CM | POA: Diagnosis not present

## 2019-01-31 DIAGNOSIS — M6281 Muscle weakness (generalized): Secondary | ICD-10-CM | POA: Diagnosis not present

## 2019-01-31 DIAGNOSIS — Z419 Encounter for procedure for purposes other than remedying health state, unspecified: Secondary | ICD-10-CM

## 2019-01-31 DIAGNOSIS — R404 Transient alteration of awareness: Secondary | ICD-10-CM | POA: Diagnosis not present

## 2019-01-31 DIAGNOSIS — W19XXXA Unspecified fall, initial encounter: Secondary | ICD-10-CM | POA: Diagnosis not present

## 2019-01-31 DIAGNOSIS — F0390 Unspecified dementia without behavioral disturbance: Secondary | ICD-10-CM | POA: Diagnosis not present

## 2019-01-31 DIAGNOSIS — Z20828 Contact with and (suspected) exposure to other viral communicable diseases: Secondary | ICD-10-CM | POA: Diagnosis present

## 2019-01-31 DIAGNOSIS — I959 Hypotension, unspecified: Secondary | ICD-10-CM | POA: Diagnosis not present

## 2019-01-31 DIAGNOSIS — G300 Alzheimer's disease with early onset: Secondary | ICD-10-CM | POA: Diagnosis not present

## 2019-01-31 DIAGNOSIS — I110 Hypertensive heart disease with heart failure: Secondary | ICD-10-CM | POA: Diagnosis not present

## 2019-01-31 LAB — CBC WITH DIFFERENTIAL/PLATELET
Abs Immature Granulocytes: 0.08 10*3/uL — ABNORMAL HIGH (ref 0.00–0.07)
Basophils Absolute: 0 10*3/uL (ref 0.0–0.1)
Basophils Relative: 0 %
Eosinophils Absolute: 0 10*3/uL (ref 0.0–0.5)
Eosinophils Relative: 0 %
HCT: 40.1 % (ref 36.0–46.0)
Hemoglobin: 12.6 g/dL (ref 12.0–15.0)
Immature Granulocytes: 1 %
Lymphocytes Relative: 4 %
Lymphs Abs: 0.5 10*3/uL — ABNORMAL LOW (ref 0.7–4.0)
MCH: 31.3 pg (ref 26.0–34.0)
MCHC: 31.4 g/dL (ref 30.0–36.0)
MCV: 99.8 fL (ref 80.0–100.0)
Monocytes Absolute: 0.8 10*3/uL (ref 0.1–1.0)
Monocytes Relative: 7 %
Neutro Abs: 9.8 10*3/uL — ABNORMAL HIGH (ref 1.7–7.7)
Neutrophils Relative %: 88 %
Platelets: 213 10*3/uL (ref 150–400)
RBC: 4.02 MIL/uL (ref 3.87–5.11)
RDW: 14.6 % (ref 11.5–15.5)
WBC: 11.1 10*3/uL — ABNORMAL HIGH (ref 4.0–10.5)
nRBC: 0 % (ref 0.0–0.2)

## 2019-01-31 LAB — COMPREHENSIVE METABOLIC PANEL
ALT: 26 U/L (ref 0–44)
AST: 25 U/L (ref 15–41)
Albumin: 3.5 g/dL (ref 3.5–5.0)
Alkaline Phosphatase: 108 U/L (ref 38–126)
Anion gap: 12 (ref 5–15)
BUN: 15 mg/dL (ref 8–23)
CO2: 27 mmol/L (ref 22–32)
Calcium: 8.9 mg/dL (ref 8.9–10.3)
Chloride: 93 mmol/L — ABNORMAL LOW (ref 98–111)
Creatinine, Ser: 0.95 mg/dL (ref 0.44–1.00)
GFR calc Af Amer: 59 mL/min — ABNORMAL LOW (ref >60)
GFR calc non Af Amer: 51 mL/min — ABNORMAL LOW (ref >60)
Glucose, Bld: 121 mg/dL — ABNORMAL HIGH (ref 70–99)
Potassium: 4.1 mmol/L (ref 3.5–5.1)
Sodium: 132 mmol/L — ABNORMAL LOW (ref 135–145)
Total Bilirubin: 1.6 mg/dL — ABNORMAL HIGH (ref 0.3–1.2)
Total Protein: 6.9 g/dL (ref 6.5–8.1)

## 2019-01-31 LAB — SARS CORONAVIRUS 2 BY RT PCR (HOSPITAL ORDER, PERFORMED IN ~~LOC~~ HOSPITAL LAB): SARS Coronavirus 2: NEGATIVE

## 2019-01-31 LAB — BRAIN NATRIURETIC PEPTIDE: B Natriuretic Peptide: 521.9 pg/mL — ABNORMAL HIGH (ref 0.0–100.0)

## 2019-01-31 MED ORDER — FUROSEMIDE 10 MG/ML IJ SOLN
20.0000 mg | Freq: Once | INTRAMUSCULAR | Status: AC
  Administered 2019-02-01: 01:00:00 20 mg via INTRAVENOUS
  Filled 2019-01-31: qty 4

## 2019-01-31 MED ORDER — ONDANSETRON HCL 4 MG/2ML IJ SOLN: 4.0000 mg | Freq: Four times a day (QID) | INTRAMUSCULAR | Status: DC | PRN

## 2019-01-31 MED ORDER — MORPHINE SULFATE (PF) 2 MG/ML IV SOLN: 0.5000 mg | INTRAVENOUS | Status: DC | PRN

## 2019-01-31 NOTE — ED Notes (Signed)
Pt moved to room to change her sheets and cleaned after urinating in the bed. A brief was placed on the patient and pt's clothing was placed in a belongings bag.

## 2019-01-31 NOTE — ED Provider Notes (Signed)
[place] DEPT Provider Note   CSN: [id number] Arrival date & time: 01/31/19  1251     History   Chief Complaint Chief Complaint  Patient presents with   Fall   Hip Pain    HPI Nichole [name] is a 85 y.o. female.     Patient is from [facility] range.  Patient known to have a degree of dementia.  Patient reportedly had a fall yesterday.  Continue to complain of left hip pain.  Patient arrived with cervical collar in place.  Patient has a history of TIAs breast cancer Alzheimer's disease hypertension and persistent atrial fibrillation.  Patient not on blood thinners.     Past Medical History:  Diagnosis Date   Alzheimer disease ([place]) 08/25/2017   Breast CA ([place])    HTN (hypertension)    Persistent atrial fibrillation 05/2018   TIA (transient ischemic attack) 2009    Patient Active Problem List   Diagnosis Date Noted   Atrial fibrillation with RVR ([place]) 06/21/2018   Anemia 06/21/2018   Renal insufficiency 06/21/2018   Bilateral lower extremity edema 06/21/2018   Fall at home, initial encounter 05/12/2018   Adjustment disorder with mixed disturbance of emotions and conduct    Closed displaced fracture of right femoral neck ([name]) 05/10/2018   Hip fracture ([name]) 05/09/2018   Alzheimer disease ([name]) 08/25/2017   SIADH (syndrome of inappropriate ADH production) ([place])    HTN (hypertension) 11/16/2015   Hyponatremia 11/13/2015    Past Surgical History:  Procedure Laterality Date   ANTERIOR APPROACH HEMI HIP ARTHROPLASTY Right 05/10/2018   Procedure: ANTERIOR APPROACH HEMI HIP ARTHROPLASTY;  Surgeon: Rod Can, MD;  [facility]: [facility];  Service: Orthopedics;  Laterality: Right;   BREAST SURGERY     MASTECTOMY Left 1995     OB History   No obstetric history on file.      Home Medications    Prior to Admission medications   Medication Sig Start Date End Date Taking? Authorizing Provider  diltiazem  (CARDIZEM CD) 120 MG 24 hr capsule TAKE 1 CAPSULE BY MOUTH EVERY DAY Patient taking differently: Take 120 mg by mouth every evening.  01/09/19  Yes Buford Dresser, MD  donepezil (ARICEPT) 5 MG tablet Take 5 mg by mouth daily.  04/23/18  Yes [provider]    Family History Family History  Problem Relation Age of Onset   Ovarian cancer Sister    Breast cancer Sister    Colon cancer Mother    AAA (abdominal aortic aneurysm) Mother    Coronary artery disease Father     Social History Social History   Tobacco Use   Smoking status: Never Smoker   Smokeless tobacco: Never Used  Substance Use Topics   Alcohol use: No   Drug use: No     Allergies   Tape   Review of Systems Review of Systems  Unable to perform ROS: Dementia     Physical Exam Updated Vital Signs BP 128/82 (BP Location: Left Arm)    Pulse 91    Temp 97.8 F (36.6 C) (Oral)    Resp 18    SpO2 94%   Physical Exam Vitals signs and nursing note reviewed.  Constitutional:      General: She is not in acute distress.    Appearance: Normal appearance. She is well-developed.  HENT:     Head: Normocephalic.     Comments: Patient with a bruise to the right cheek area patient in cervical collar. Eyes:  Extraocular Movements: Extraocular movements intact.     Conjunctiva/sclera: Conjunctivae normal.     Pupils: Pupils are equal, round, and reactive to light.  Neck:     Comments: Cervical collar in place. Cardiovascular:     Rate and Rhythm: Normal rate and regular rhythm.     Heart sounds: No murmur.  Pulmonary:     Effort: Pulmonary effort is normal. No respiratory distress.     Breath sounds: Normal breath sounds.  Abdominal:     Palpations: Abdomen is soft.     Tenderness: There is no abdominal tenderness.  Musculoskeletal: Normal range of motion.        General: Swelling, tenderness and signs of injury present.     Comments: Tenderness to palpation to the left hip area.  No  significant pain with rotation of the leg.  Bilateral lower extremity edema.'s some erythema to both legs.  Skin:    General: Skin is warm and dry.     Capillary Refill: Capillary refill takes less than 2 seconds.  Neurological:     Mental Status: She is alert. Mental status is at baseline.      ED Treatments / Results  Labs (all labs ordered are listed, but only abnormal results are displayed) Labs Reviewed  SARS CORONAVIRUS 2 (HOSPITAL ORDER, [facility])  COMPREHENSIVE METABOLIC PANEL  CBC WITH DIFFERENTIAL/PLATELET  BRAIN NATRIURETIC PEPTIDE  URINALYSIS, ROUTINE W REFLEX MICROSCOPIC    EKG EKG Interpretation  Date/Time:  Tuesday January 31 2019 17:09:01 EDT Ventricular Rate:  91 PR Interval:    QRS Duration: 104 QT Interval:  386 QTC Calculation: 475 R Axis:   89 Text Interpretation:  Atrial fibrillation Borderline right axis deviation Confirmed by Fredia Sorrow [id number]) on 01/31/2019 5:26:39 PM   Radiology Ct Head Wo Contrast  Result Date: 01/31/2019 CLINICAL DATA:  Status post fall yesterday. EXAM: CT HEAD WITHOUT CONTRAST CT CERVICAL SPINE WITHOUT CONTRAST TECHNIQUE: Multidetector CT imaging of the head and cervical spine was performed following the standard protocol without intravenous contrast. Multiplanar CT image reconstructions of the cervical spine were also generated. COMPARISON:  August 05, 2018 FINDINGS: CT HEAD FINDINGS Brain: No evidence of acute infarction, hemorrhage, hydrocephalus, extra-axial collection or mass lesion/mass effect. There is chronic diffuse atrophy. Chronic bilateral periventricular white matter small vessel ischemic changes are noted. Old infarcts identified in the right basal ganglia and in the left cerebellum. Vascular: No hyperdense vessel is noted. Skull: Normal. Negative for fracture or focal lesion. Sinuses/Orbits: No acute finding. Other: None. CT CERVICAL SPINE FINDINGS Alignment: Normal. Skull base and  vertebrae: No acute fracture. No primary bone lesion or focal pathologic process. Soft tissues and spinal canal: No prevertebral fluid or swelling. No visible canal hematoma. Disc levels: There are degenerative joint changes noted throughout the cervical spine with narrowed joint space and osteophyte formation. Partial fusion of C3 and C4 is noted. Upper chest: There is a spiculated nodule in the right apex measuring 1.1 cm. Other: None. IMPRESSION: No focal acute intracranial abnormality identified. No acute fracture or dislocation of cervical spine. Spiculated nodule in the right apex measuring 1.1 cm. Neoplasm is not excluded. Further evaluation with a chest CT on outpatient basis is recommended. Electronically Signed   By: Abelardo Diesel M.D.   On: 01/31/2019 15:00   Ct Cervical Spine Wo Contrast  Result Date: 01/31/2019 CLINICAL DATA:  Status post fall yesterday. EXAM: CT HEAD WITHOUT CONTRAST CT CERVICAL SPINE WITHOUT CONTRAST TECHNIQUE: Multidetector CT imaging of  the head and cervical spine was performed following the standard protocol without intravenous contrast. Multiplanar CT image reconstructions of the cervical spine were also generated. COMPARISON:  August 05, 2018 FINDINGS: CT HEAD FINDINGS Brain: No evidence of acute infarction, hemorrhage, hydrocephalus, extra-axial collection or mass lesion/mass effect. There is chronic diffuse atrophy. Chronic bilateral periventricular white matter small vessel ischemic changes are noted. Old infarcts identified in the right basal ganglia and in the left cerebellum. Vascular: No hyperdense vessel is noted. Skull: Normal. Negative for fracture or focal lesion. Sinuses/Orbits: No acute finding. Other: None. CT CERVICAL SPINE FINDINGS Alignment: Normal. Skull base and vertebrae: No acute fracture. No primary bone lesion or focal pathologic process. Soft tissues and spinal canal: No prevertebral fluid or swelling. No visible canal hematoma. Disc levels: There are  degenerative joint changes noted throughout the cervical spine with narrowed joint space and osteophyte formation. Partial fusion of C3 and C4 is noted. Upper chest: There is a spiculated nodule in the right apex measuring 1.1 cm. Other: None. IMPRESSION: No focal acute intracranial abnormality identified. No acute fracture or dislocation of cervical spine. Spiculated nodule in the right apex measuring 1.1 cm. Neoplasm is not excluded. Further evaluation with a chest CT on outpatient basis is recommended. Electronically Signed   By: Abelardo Diesel M.D.   On: 01/31/2019 15:00   Dg Chest Port 1 View  Result Date: 01/31/2019 CLINICAL DATA:  Preoperative assessment for hip fracture. EXAM: PORTABLE CHEST 1 VIEW COMPARISON:  June 21, 2018 FINDINGS: There is a moderate pleural effusion on the left with a much smaller pleural effusion on the right. There is mild cardiomegaly with pulmonary venous hypertension. There is mild hazy opacity in the upper lobes which likely represents mild edema. No frank consolidation evident. No pneumothorax. No adenopathy. No bone lesions. IMPRESSION: Pulmonary vascular congestion with a likely degree of congestive heart failure. Pleural effusions bilaterally, larger on the left than on the right. No frank consolidation. Electronically Signed   By: Lowella Grip III M.D.   On: 01/31/2019 16:50   Dg Hips Bilat W Or Wo Pelvis 3-4 Views  Result Date: 01/31/2019 CLINICAL DATA:  Status post fall yesterday with left hip pain. EXAM: DG HIP (WITH OR WITHOUT PELVIS) 3-4V BILAT COMPARISON:  None. FINDINGS: There is displaced fracture of the left femoral neck. No other acute fracture or dislocation is identified. There is a right hip replacement without malalignment. IMPRESSION: Displaced fracture of the left femoral neck. Electronically Signed   By: Abelardo Diesel M.D.   On: 01/31/2019 15:06    Procedures Procedures (including critical care time)  Medications Ordered in  ED Medications - No data to display   Initial Impression / Assessment and Plan / ED Course  I have reviewed the triage vital signs and the nursing notes.  Pertinent labs & imaging results that were available during my care of the patient were reviewed by me and considered in my medical decision making (see chart for details).       Patient with new left femoral neck fracture.  Discussed with Dr. Lyla Glassing from orthopedics he had refaxed her right hip in the fall.  He will see her and plans to repair it tomorrow.  Once hospitalist to do the admission.  Patient not on blood thinners.  EKG consistent with atrial for but rate controlled.  CT head neck without any acute findings.  COVID testing negative.  Chest x-ray suggestive abuse mild CHF.  Also questionable pleural effusions.  No signs  of pneumonia.  BNP was elevated at 521.   Final Clinical Impressions(s) / ED Diagnoses   Final diagnoses:  Closed fracture of left hip, initial encounter [facility])    ED Discharge Orders    None       Fredia Sorrow, MD 01/31/19 2030

## 2019-01-31 NOTE — ED Notes (Signed)
RN spoke to patient's granddaughter in person and let her know patient's plan of care. Family was concerned that patient can get very confused and combative, RN explained that that is why patient is in the hall and that it gives [place] better visual access and we can reassure patient more often and try to assist her in staying calm. Pt has been cooperative and calm so far.  Pt's granddaughter was concerned that the nurse at [facility] told her that her grandmother was "immobile"  RN explained that patient was able to move her arms and legs, but she is just sore and uncomfortable.  RN assured pt's granddaughter that we would let them know the results of the scans.

## 2019-01-31 NOTE — ED Triage Notes (Signed)
Pt reportedly fell yesterday. Pt is from [facility]. Pt has no formal diagnosis of dementia but has some "memory impairment" per facility.  Pt reportedly has left leg/hip pain and they decided to send her out today.  No rotation noted per EMS.  Pt denies bloodthinners.  164/84 90 HR (Hx of afib) RR 16 133 CBG

## 2019-01-31 NOTE — Progress Notes (Signed)
Consult received. Patient known to me for previous R hip fx. Now has displaced left femoral neck fracture. Plan for L hip hemiarthroplasty tomorrow. NPO after MN. Hold chemical DVT ppx. Will see patient in am.

## 2019-01-31 NOTE — H&P (Signed)
History and Physical    Nichole [name] PRF:[id number] DOB: 12-13-1922 DOA: 01/31/2019  PCP: Aretta Nip, MD   Patient coming from: SNF   Chief Complaint: Fall, left hip pain   HPI: Nichole [name] is a 85 y.o. female with medical history significant for atrial fibrillation no longer on anticoagulation, Alzheimer dementia, and hypertension, now presenting to emergency department with left hip pain after a fall yesterday.  Patient does not remember falling but acknowledges pain in the left leg.  She denies any chest pain, headache, or shortness of breath.  EMS was called to the SNF today when the patient was complaining of left leg and hip pain.  She was placed in a cervical collar and transported to the ED.  ED Course: Upon arrival to the ED, patient is found to be afebrile, saturating mid 90s on room air, and with remaining vitals also stable.  EKG features atrial fibrillation with rate 91.  Noncontrast head CT is negative for acute intracranial abnormality and cervical spine CT negative for fracture or dislocation.  Chest x-ray notable for spiculated nodule at the right apex, pulmonary vascular congestion, and bilateral pleural effusions.  Radiographs of the hips demonstrate displaced left femoral neck fracture.  Chemistry panel notable for sodium of 132.  CBC features a mild leukocytosis.  BNP is elevated 522.  COVID-19 screening test is negative.  Orthopedic surgery was consulted by the ED physician and recommends medical admission.  Review of Systems:  ROS is limited by the patient's clinical condition.  Past Medical History:  Diagnosis Date  . Alzheimer disease ([place]) 08/25/2017  . Breast CA ([place])   . HTN (hypertension)   . Persistent atrial fibrillation 05/2018  . TIA (transient ischemic attack) 2009    Past Surgical History:  Procedure Laterality Date  . ANTERIOR APPROACH HEMI HIP ARTHROPLASTY Right 05/10/2018   Procedure: ANTERIOR APPROACH HEMI HIP ARTHROPLASTY;  Surgeon:  Rod Can, MD;  Location: [facility];  Service: Orthopedics;  Laterality: Right;  . BREAST SURGERY    . MASTECTOMY Left 1995     reports that she has never smoked. She has never used smokeless tobacco. She reports that she does not drink alcohol or use drugs.  Allergies  Allergen Reactions  . Tape Other (See Comments)    SKIN IS VERY THIN, SO IT WILL TEAR AND BRUISE EASILY!!    Family History  Problem Relation Age of Onset  . Ovarian cancer Sister   . Breast cancer Sister   . Colon cancer Mother   . AAA (abdominal aortic aneurysm) Mother   . Coronary artery disease Father      Prior to Admission medications   Medication Sig Start Date End Date Taking? Authorizing Provider  diltiazem (CARDIZEM CD) 120 MG 24 hr capsule TAKE 1 CAPSULE BY MOUTH EVERY DAY Patient taking differently: Take 120 mg by mouth every evening.  01/09/19  Yes Buford Dresser, MD  donepezil (ARICEPT) 5 MG tablet Take 5 mg by mouth daily.  04/23/18  Yes [provider]    Physical Exam: Vitals:   01/31/19 1309 01/31/19 1904  BP: 128/82 (!) 168/83  Pulse: 91 95  Resp: 18 16  Temp: 97.8 F (36.6 C)   TempSrc: Oral   SpO2: 94% 95%    Constitutional: NAD, calm  Eyes: PERTLA, lids and conjunctivae normal ENMT: Mucous membranes are moist. Posterior pharynx clear of any exudate or lesions.   Neck: normal, supple, no masses, no thyromegaly Respiratory: no wheezing, no crackles. Normal respiratory  effort. No accessory muscle use.  Cardiovascular: Rate ~75 and irregularly irregular. Pretibial pitting edema.   Abdomen: No distension, no tenderness, soft. Bowel sounds normal.  Musculoskeletal: no clubbing / cyanosis. Left hip tenderness, neurovascularly intact distally.     Skin: Faint erythema overlies lower LE's, Rt > Lt. Ulceration at dorsal aspect of first MTP on right without drainage. Warm, dry, well-perfused. Neurologic: No gross facial asymmetry. Sensation intact. Moving all extremities.   Psychiatric: Alert and oriented to person and place only. Calm, cooperative.    Labs on Admission: I have personally reviewed following labs and imaging studies  CBC: Recent Labs  Lab 01/31/19 1716  WBC 11.1*  NEUTROABS 9.8*  HGB 12.6  HCT 40.1  MCV 99.8  PLT 161   Basic Metabolic Panel: Recent Labs  Lab 01/31/19 1716  NA 132*  K 4.1  CL 93*  CO2 27  GLUCOSE 121*  BUN 15  CREATININE 0.95  CALCIUM 8.9   GFR: CrCl cannot be calculated (Unknown ideal weight.). Liver Function Tests: Recent Labs  Lab 01/31/19 1716  AST 25  ALT 26  ALKPHOS 108  BILITOT 1.6*  PROT 6.9  ALBUMIN 3.5   No results for input(s): LIPASE, AMYLASE in the last 168 hours. No results for input(s): AMMONIA in the last 168 hours. Coagulation Profile: No results for input(s): INR, PROTIME in the last 168 hours. Cardiac Enzymes: No results for input(s): CKTOTAL, CKMB, CKMBINDEX, TROPONINI in the last 168 hours. BNP (last 3 results) No results for input(s): PROBNP in the last 8760 hours. HbA1C: No results for input(s): HGBA1C in the last 72 hours. CBG: No results for input(s): GLUCAP in the last 168 hours. Lipid Profile: No results for input(s): CHOL, HDL, LDLCALC, TRIG, CHOLHDL, LDLDIRECT in the last 72 hours. Thyroid Function Tests: No results for input(s): TSH, T4TOTAL, FREET4, T3FREE, THYROIDAB in the last 72 hours. Anemia Panel: No results for input(s): VITAMINB12, FOLATE, FERRITIN, TIBC, IRON, RETICCTPCT in the last 72 hours. Urine analysis:    Component Value Date/Time   COLORURINE YELLOW 06/21/2018 1616   APPEARANCEUR HAZY (A) 06/21/2018 1616   LABSPEC 1.026 06/21/2018 1616   PHURINE 5.0 06/21/2018 1616   GLUCOSEU NEGATIVE 06/21/2018 1616   HGBUR MODERATE (A) 06/21/2018 1616   BILIRUBINUR NEGATIVE 06/21/2018 1616   KETONESUR 5 (A) 06/21/2018 1616   PROTEINUR 30 (A) 06/21/2018 1616   NITRITE NEGATIVE 06/21/2018 1616   LEUKOCYTESUR SMALL (A) 06/21/2018 1616   Sepsis Labs:  @LABRCNTIP (procalcitonin:4,lacticidven:4) ) Recent Results (from the past 240 hour(s))  SARS Coronavirus 2 (CEPHEID - Performed in [place] hospital lab), Hosp Order     Status: None   Collection Time: 01/31/19  5:16 PM   Specimen: Nasopharyngeal Swab  Result Value Ref Range Status   SARS Coronavirus 2 NEGATIVE NEGATIVE Final    Comment: (NOTE) If result is NEGATIVE SARS-CoV-2 target nucleic acids are NOT DETECTED. The SARS-CoV-2 RNA is generally detectable in upper and lower  respiratory specimens during the acute phase of infection. The lowest  concentration of SARS-CoV-2 viral copies this assay can detect is 250  copies / mL. A negative result does not preclude SARS-CoV-2 infection  and should not be used as the sole basis for treatment or other  patient management decisions.  A negative result may occur with  improper specimen collection / handling, submission of specimen other  than nasopharyngeal swab, presence of viral mutation(s) within the  areas targeted by this assay, and inadequate number of viral copies  (<250 copies /  mL). A negative result must be combined with clinical  observations, patient history, and epidemiological information. If result is POSITIVE SARS-CoV-2 target nucleic acids are DETECTED. The SARS-CoV-2 RNA is generally detectable in upper and lower  respiratory specimens dur ing the acute phase of infection.  Positive  results are indicative of active infection with SARS-CoV-2.  Clinical  correlation with patient history and other diagnostic information is  necessary to determine patient infection status.  Positive results do  not rule out bacterial infection or co-infection with other viruses. If result is PRESUMPTIVE POSTIVE SARS-CoV-2 nucleic acids MAY BE PRESENT.   A presumptive positive result was obtained on the submitted specimen  and confirmed on repeat testing.  While 2019 novel coronavirus  (SARS-CoV-2) nucleic acids may be present in the  submitted sample  additional confirmatory testing may be necessary for epidemiological  and / or clinical management purposes  to differentiate between  SARS-CoV-2 and other Sarbecovirus currently known to infect humans.  If clinically indicated additional testing with an alternate test  methodology [phone]) is advised. The SARS-CoV-2 RNA is generally  detectable in upper and lower respiratory sp ecimens during the acute  phase of infection. The expected result is Negative. Fact Sheet for Patients:  StrictlyIdeas.no Fact Sheet for Healthcare Providers: BankingDealers.co.za This test is not yet approved or cleared by the [place] FDA and has been authorized for detection and/or diagnosis of SARS-CoV-2 by FDA under an Emergency Use Authorization (EUA).  This EUA will remain in effect (meaning this test can be used) for the duration of the COVID-19 declaration under Section 564(b)(1) of the Act, 21 U.S.C. section 360bbb-3(b)(1), unless the authorization is terminated or revoked sooner. Performed at [facility], [address], [place], [place] [address]      Radiological Exams on Admission: Ct Head Wo Contrast  Result Date: 01/31/2019 CLINICAL DATA:  Status post fall yesterday. EXAM: CT HEAD WITHOUT CONTRAST CT CERVICAL SPINE WITHOUT CONTRAST TECHNIQUE: Multidetector CT imaging of the head and cervical spine was performed following the standard protocol without intravenous contrast. Multiplanar CT image reconstructions of the cervical spine were also generated. COMPARISON:  August 05, 2018 FINDINGS: CT HEAD FINDINGS Brain: No evidence of acute infarction, hemorrhage, hydrocephalus, extra-axial collection or mass lesion/mass effect. There is chronic diffuse atrophy. Chronic bilateral periventricular white matter small vessel ischemic changes are noted. Old infarcts identified in the right basal ganglia and in the left  cerebellum. Vascular: No hyperdense vessel is noted. Skull: Normal. Negative for fracture or focal lesion. Sinuses/Orbits: No acute finding. Other: None. CT CERVICAL SPINE FINDINGS Alignment: Normal. Skull base and vertebrae: No acute fracture. No primary bone lesion or focal pathologic process. Soft tissues and spinal canal: No prevertebral fluid or swelling. No visible canal hematoma. Disc levels: There are degenerative joint changes noted throughout the cervical spine with narrowed joint space and osteophyte formation. Partial fusion of C3 and C4 is noted. Upper chest: There is a spiculated nodule in the right apex measuring 1.1 cm. Other: None. IMPRESSION: No focal acute intracranial abnormality identified. No acute fracture or dislocation of cervical spine. Spiculated nodule in the right apex measuring 1.1 cm. Neoplasm is not excluded. Further evaluation with a chest CT on outpatient basis is recommended. Electronically Signed   By: Abelardo Diesel M.D.   On: 01/31/2019 15:00   Ct Cervical Spine Wo Contrast  Result Date: 01/31/2019 CLINICAL DATA:  Status post fall yesterday. EXAM: CT HEAD WITHOUT CONTRAST CT CERVICAL SPINE WITHOUT CONTRAST TECHNIQUE: Multidetector  CT imaging of the head and cervical spine was performed following the standard protocol without intravenous contrast. Multiplanar CT image reconstructions of the cervical spine were also generated. COMPARISON:  August 05, 2018 FINDINGS: CT HEAD FINDINGS Brain: No evidence of acute infarction, hemorrhage, hydrocephalus, extra-axial collection or mass lesion/mass effect. There is chronic diffuse atrophy. Chronic bilateral periventricular white matter small vessel ischemic changes are noted. Old infarcts identified in the right basal ganglia and in the left cerebellum. Vascular: No hyperdense vessel is noted. Skull: Normal. Negative for fracture or focal lesion. Sinuses/Orbits: No acute finding. Other: None. CT CERVICAL SPINE FINDINGS Alignment:  Normal. Skull base and vertebrae: No acute fracture. No primary bone lesion or focal pathologic process. Soft tissues and spinal canal: No prevertebral fluid or swelling. No visible canal hematoma. Disc levels: There are degenerative joint changes noted throughout the cervical spine with narrowed joint space and osteophyte formation. Partial fusion of C3 and C4 is noted. Upper chest: There is a spiculated nodule in the right apex measuring 1.1 cm. Other: None. IMPRESSION: No focal acute intracranial abnormality identified. No acute fracture or dislocation of cervical spine. Spiculated nodule in the right apex measuring 1.1 cm. Neoplasm is not excluded. Further evaluation with a chest CT on outpatient basis is recommended. Electronically Signed   By: Abelardo Diesel M.D.   On: 01/31/2019 15:00   Dg Chest Port 1 View  Result Date: 01/31/2019 CLINICAL DATA:  Preoperative assessment for hip fracture. EXAM: PORTABLE CHEST 1 VIEW COMPARISON:  June 21, 2018 FINDINGS: There is a moderate pleural effusion on the left with a much smaller pleural effusion on the right. There is mild cardiomegaly with pulmonary venous hypertension. There is mild hazy opacity in the upper lobes which likely represents mild edema. No frank consolidation evident. No pneumothorax. No adenopathy. No bone lesions. IMPRESSION: Pulmonary vascular congestion with a likely degree of congestive heart failure. Pleural effusions bilaterally, larger on the left than on the right. No frank consolidation. Electronically Signed   By: Lowella Grip III M.D.   On: 01/31/2019 16:50   Dg Hips Bilat W Or Wo Pelvis 3-4 Views  Result Date: 01/31/2019 CLINICAL DATA:  Status post fall yesterday with left hip pain. EXAM: DG HIP (WITH OR WITHOUT PELVIS) 3-4V BILAT COMPARISON:  None. FINDINGS: There is displaced fracture of the left femoral neck. No other acute fracture or dislocation is identified. There is a right hip replacement without malalignment.  IMPRESSION: Displaced fracture of the left femoral neck. Electronically Signed   By: Abelardo Diesel M.D.   On: 01/31/2019 15:06    EKG: Independently reviewed. Atrial fibrillation, rate 91, QTc 475 ms.   Assessment/Plan   1. Left hip fracture  - Presents with left hip pain after a fall at her SNF  - Radiographs reveal displaced left femoral neck fracture  - Orthopedic surgery is consulting and much appreciated  - Based on the available data, Nichole [name] presents an estimated 2.2% risk of perioperative MI or cardiac arrest; risk is d/t her advanced age and chronic conditions, no further testing is indicated   - Keep NPO after midnight, optimize fluid-status, continue pain-control and supportive care  2. Acute on chronic diastolic CHF  - There is peripheral edema, elevated BNP, and CXR findings consistent with CHF  - EF was 55-60% in December 2019, at which time there was mild AS and AI, moderate MR and LAE, and severe TR  - SLIV, give a dose of Lasix 20 mg IV, follow I/O's  and daily wt, repeat chem panel in am    3. Atrial fibrillation  - In rate-controlled a fib on admission  - CHADS-VASc is 26 (age x2, TIA x2, gender, CHF, HTN) - She is no longer anticoagulated d/t recurrent falls  - Continue diltiazem    4. Dementia  - Continue Aricept, monitor for sundowning, fall precautions    5. Hyponatremia  - Serum sodium is 132 on admission in setting of hypervolemia  - She is receiving a dose of Lasix and chemistries will be repeated in am    6. Lung nodule  - Spiculated 1.1 cm nodule incidentally noted in right apex on preoperative CXR - Outpatient follow-up recommended    7. CKD III  - SCr is 0.95 on admission, lower than priors in setting of mild hypervolemia  - She is receiving a dose of Lasix and chem panel will be repeated in am    PPE:  Mask, face shield. Patient wearing mask.  DVT prophylaxis: SCD's  Code Status: Full  Family Communication: Discussed with patient   Consults called: Orthopedic surgery  Admission status: Inpatient     Vianne Bulls, MD [facility] Pager [phone]  If 7PM-7AM, please contact night-coverage [email or web address] Password [email or web address]  01/31/2019, 8:40 PM

## 2019-02-01 ENCOUNTER — Inpatient Hospital Stay (HOSPITAL_COMMUNITY): Payer: Medicare Other | Admitting: Anesthesiology

## 2019-02-01 ENCOUNTER — Encounter (HOSPITAL_COMMUNITY)
Admission: EM | Disposition: A | Discharge status: Skilled Nursing Facility | Payer: Self-pay | Source: Skilled Nursing Facility | Attending: Internal Medicine

## 2019-02-01 ENCOUNTER — Inpatient Hospital Stay (HOSPITAL_COMMUNITY): Payer: Medicare Other

## 2019-02-01 ENCOUNTER — Other Ambulatory Visit: Payer: Self-pay

## 2019-02-01 ENCOUNTER — Encounter (HOSPITAL_COMMUNITY): Payer: Self-pay | Admitting: *Deleted

## 2019-02-01 DIAGNOSIS — I10 Essential (primary) hypertension: Secondary | ICD-10-CM

## 2019-02-01 HISTORY — PX: ANTERIOR APPROACH HEMI HIP ARTHROPLASTY: SHX6690

## 2019-02-01 LAB — BASIC METABOLIC PANEL
Anion gap: 13 (ref 5–15)
BUN: 16 mg/dL (ref 8–23)
CO2: 26 mmol/L (ref 22–32)
Calcium: 8.7 mg/dL — ABNORMAL LOW (ref 8.9–10.3)
Chloride: 93 mmol/L — ABNORMAL LOW (ref 98–111)
Creatinine, Ser: 0.84 mg/dL (ref 0.44–1.00)
GFR calc Af Amer: >60 mL/min (ref >60)
GFR calc non Af Amer: 59 mL/min — ABNORMAL LOW (ref >60)
Glucose, Bld: 94 mg/dL (ref 70–99)
Potassium: 3.8 mmol/L (ref 3.5–5.1)
Sodium: 132 mmol/L — ABNORMAL LOW (ref 135–145)

## 2019-02-01 LAB — URINALYSIS, ROUTINE W REFLEX MICROSCOPIC
Bilirubin Urine: NEGATIVE
Glucose, UA: 50 mg/dL — AB
Ketones, ur: 5 mg/dL — AB
Leukocytes,Ua: NEGATIVE
Nitrite: NEGATIVE
Protein, ur: 30 mg/dL — AB
Specific Gravity, Urine: 1.019 (ref 1.005–1.030)
pH: 6 (ref 5.0–8.0)

## 2019-02-01 LAB — MAGNESIUM: Magnesium: 1.9 mg/dL (ref 1.7–2.4)

## 2019-02-01 LAB — CBC
HCT: 40 % (ref 36.0–46.0)
Hemoglobin: 12.8 g/dL (ref 12.0–15.0)
MCH: 32.2 pg (ref 26.0–34.0)
MCHC: 32 g/dL (ref 30.0–36.0)
MCV: 100.5 fL — ABNORMAL HIGH (ref 80.0–100.0)
Platelets: 209 10*3/uL (ref 150–400)
RBC: 3.98 MIL/uL (ref 3.87–5.11)
RDW: 14.6 % (ref 11.5–15.5)
WBC: 11.3 10*3/uL — ABNORMAL HIGH (ref 4.0–10.5)
nRBC: 0 % (ref 0.0–0.2)

## 2019-02-01 LAB — TYPE AND SCREEN
ABO/RH(D): O POS
Antibody Screen: NEGATIVE

## 2019-02-01 LAB — MRSA PCR SCREENING: MRSA by PCR: NEGATIVE

## 2019-02-01 SURGERY — HEMIARTHROPLASTY, HIP, DIRECT ANTERIOR APPROACH, FOR FRACTURE
Anesthesia: Spinal | Laterality: Left

## 2019-02-01 MED ORDER — ONDANSETRON HCL 4 MG/2ML IJ SOLN
INTRAMUSCULAR | Status: AC
  Filled 2019-02-01: qty 2

## 2019-02-01 MED ORDER — FUROSEMIDE 10 MG/ML IJ SOLN
INTRAMUSCULAR | Status: AC
  Filled 2019-02-01: qty 2

## 2019-02-01 MED ORDER — DONEPEZIL HCL 5 MG PO TABS
5.0000 mg | ORAL_TABLET | Freq: Every day | ORAL | Status: DC
  Administered 2019-02-02 – 2019-02-03 (×2): 5 mg via ORAL
  Filled 2019-02-01 (×2): qty 1

## 2019-02-01 MED ORDER — TRANEXAMIC ACID-NACL 1000-0.7 MG/100ML-% IV SOLN
1000.0000 mg | INTRAVENOUS | Status: AC
  Administered 2019-02-01: 16:00:00 1000 mg via INTRAVENOUS
  Filled 2019-02-01: qty 100

## 2019-02-01 MED ORDER — FENTANYL CITRATE (PF) 100 MCG/2ML IJ SOLN
INTRAMUSCULAR | Status: AC
  Filled 2019-02-01: qty 2

## 2019-02-01 MED ORDER — ENOXAPARIN SODIUM 40 MG/0.4ML ~~LOC~~ SOLN
40.0000 mg | SUBCUTANEOUS | Status: DC
  Administered 2019-02-02: 40 mg via SUBCUTANEOUS
  Filled 2019-02-01: qty 0.4

## 2019-02-01 MED ORDER — BUPIVACAINE-EPINEPHRINE (PF) 0.25% -1:200000 IJ SOLN
INTRAMUSCULAR | Status: AC
  Filled 2019-02-01: qty 30

## 2019-02-01 MED ORDER — PHENYLEPHRINE 40 MCG/ML (10ML) SYRINGE FOR IV PUSH (FOR BLOOD PRESSURE SUPPORT)
PREFILLED_SYRINGE | INTRAVENOUS | Status: DC | PRN
  Administered 2019-02-01: 120 ug via INTRAVENOUS

## 2019-02-01 MED ORDER — MORPHINE SULFATE (PF) 2 MG/ML IV SOLN
0.5000 mg | INTRAVENOUS | Status: DC | PRN
  Administered 2019-02-02: 1 mg via INTRAVENOUS
  Filled 2019-02-01: qty 1

## 2019-02-01 MED ORDER — POLYETHYLENE GLYCOL 3350 17 G PO PACK
17.0000 g | PACK | Freq: Every day | ORAL | Status: DC | PRN
  Administered 2019-02-02: 17 g via ORAL
  Filled 2019-02-01: qty 1

## 2019-02-01 MED ORDER — CHLORHEXIDINE GLUCONATE 4 % EX LIQD: 60.0000 mL | Freq: Once | CUTANEOUS | Status: DC

## 2019-02-01 MED ORDER — HYDROMORPHONE HCL 1 MG/ML IJ SOLN: 0.2500 mg | INTRAMUSCULAR | Status: DC | PRN

## 2019-02-01 MED ORDER — DEXAMETHASONE SODIUM PHOSPHATE 10 MG/ML IJ SOLN
INTRAMUSCULAR | Status: DC | PRN
  Administered 2019-02-01: 4 mg via INTRAVENOUS

## 2019-02-01 MED ORDER — KETOROLAC TROMETHAMINE 30 MG/ML IJ SOLN
INTRAMUSCULAR | Status: DC | PRN
  Administered 2019-02-01: 30 mg via INTRA_ARTICULAR

## 2019-02-01 MED ORDER — PROPOFOL 500 MG/50ML IV EMUL
INTRAVENOUS | Status: DC | PRN
  Administered 2019-02-01: 75 ug/kg/min via INTRAVENOUS

## 2019-02-01 MED ORDER — ONDANSETRON HCL 4 MG/2ML IJ SOLN: 4.0000 mg | Freq: Four times a day (QID) | INTRAMUSCULAR | Status: DC | PRN

## 2019-02-01 MED ORDER — POVIDONE-IODINE 10 % EX SWAB
2.0000 "application " | Freq: Once | CUTANEOUS | Status: AC
  Administered 2019-02-01: 2 via TOPICAL

## 2019-02-01 MED ORDER — PHENYLEPHRINE 40 MCG/ML (10ML) SYRINGE FOR IV PUSH (FOR BLOOD PRESSURE SUPPORT)
PREFILLED_SYRINGE | INTRAVENOUS | Status: AC
  Filled 2019-02-01: qty 10

## 2019-02-01 MED ORDER — DILTIAZEM HCL ER COATED BEADS 120 MG PO CP24
120.0000 mg | ORAL_CAPSULE | Freq: Every evening | ORAL | Status: DC
  Administered 2019-02-01: 120 mg via ORAL
  Filled 2019-02-01: qty 1

## 2019-02-01 MED ORDER — FUROSEMIDE 10 MG/ML IJ SOLN
20.0000 mg | Freq: Two times a day (BID) | INTRAMUSCULAR | Status: DC
  Administered 2019-02-01 – 2019-02-02 (×3): 20 mg via INTRAVENOUS
  Filled 2019-02-01 (×2): qty 2

## 2019-02-01 MED ORDER — SODIUM CHLORIDE 0.9 % IR SOLN
Status: DC | PRN
  Administered 2019-02-01: 2000 mL

## 2019-02-01 MED ORDER — METOCLOPRAMIDE HCL 5 MG PO TABS: 5.0000 mg | ORAL_TABLET | Freq: Three times a day (TID) | ORAL | Status: DC | PRN

## 2019-02-01 MED ORDER — HYDROCODONE-ACETAMINOPHEN 7.5-325 MG PO TABS
1.0000 | ORAL_TABLET | ORAL | Status: DC | PRN
  Administered 2019-02-02: 1 via ORAL
  Filled 2019-02-01 (×2): qty 1

## 2019-02-01 MED ORDER — PROMETHAZINE HCL 25 MG/ML IJ SOLN: 6.2500 mg | INTRAMUSCULAR | Status: DC | PRN

## 2019-02-01 MED ORDER — ONDANSETRON HCL 4 MG/2ML IJ SOLN
INTRAMUSCULAR | Status: DC | PRN
  Administered 2019-02-01: 4 mg via INTRAVENOUS

## 2019-02-01 MED ORDER — LACTATED RINGERS IV SOLN
INTRAVENOUS | Status: DC
  Administered 2019-02-01: 14:00:00 via INTRAVENOUS

## 2019-02-01 MED ORDER — SODIUM CHLORIDE 0.9 % IV SOLN
INTRAVENOUS | Status: DC | PRN
  Administered 2019-02-01: 15 ug/min via INTRAVENOUS

## 2019-02-01 MED ORDER — DEXAMETHASONE SODIUM PHOSPHATE 10 MG/ML IJ SOLN
INTRAMUSCULAR | Status: AC
  Filled 2019-02-01: qty 1

## 2019-02-01 MED ORDER — CEFAZOLIN SODIUM-DEXTROSE 2-4 GM/100ML-% IV SOLN
2.0000 g | Freq: Four times a day (QID) | INTRAVENOUS | Status: AC
  Administered 2019-02-01 – 2019-02-02 (×2): 2 g via INTRAVENOUS
  Filled 2019-02-01 (×3): qty 100

## 2019-02-01 MED ORDER — PROPOFOL 10 MG/ML IV BOLUS
INTRAVENOUS | Status: DC | PRN
  Administered 2019-02-01 (×2): 10 mg via INTRAVENOUS

## 2019-02-01 MED ORDER — HYDROCODONE-ACETAMINOPHEN 5-325 MG PO TABS: 1.0000 | ORAL_TABLET | ORAL | Status: DC | PRN

## 2019-02-01 MED ORDER — 0.9 % SODIUM CHLORIDE (POUR BTL) OPTIME
TOPICAL | Status: DC | PRN
  Administered 2019-02-01: 17:00:00 1000 mL

## 2019-02-01 MED ORDER — KETOROLAC TROMETHAMINE 30 MG/ML IJ SOLN
INTRAMUSCULAR | Status: AC
  Filled 2019-02-01: qty 1

## 2019-02-01 MED ORDER — BUPIVACAINE-EPINEPHRINE 0.25% -1:200000 IJ SOLN
INTRAMUSCULAR | Status: DC | PRN
  Administered 2019-02-01: 30 mL

## 2019-02-01 MED ORDER — SODIUM CHLORIDE (PF) 0.9 % IJ SOLN
INTRAMUSCULAR | Status: DC | PRN
  Administered 2019-02-01: 50 mL

## 2019-02-01 MED ORDER — PROPOFOL 10 MG/ML IV BOLUS
INTRAVENOUS | Status: AC
  Filled 2019-02-01: qty 20

## 2019-02-01 MED ORDER — ONDANSETRON HCL 4 MG PO TABS: 4.0000 mg | ORAL_TABLET | Freq: Four times a day (QID) | ORAL | Status: DC | PRN

## 2019-02-01 MED ORDER — BUPIVACAINE IN DEXTROSE 0.75-8.25 % IT SOLN
INTRATHECAL | Status: DC | PRN
  Administered 2019-02-01: 1.8 mL via INTRATHECAL

## 2019-02-01 MED ORDER — SENNA 8.6 MG PO TABS
1.0000 | ORAL_TABLET | Freq: Two times a day (BID) | ORAL | Status: DC
  Administered 2019-02-01 – 2019-02-03 (×4): 8.6 mg via ORAL
  Filled 2019-02-01 (×4): qty 1

## 2019-02-01 MED ORDER — ENSURE ENLIVE PO LIQD
237.0000 mL | Freq: Two times a day (BID) | ORAL | Status: DC
  Administered 2019-02-01 – 2019-02-03 (×4): 237 mL via ORAL

## 2019-02-01 MED ORDER — ACETAMINOPHEN 325 MG PO TABS
325.0000 mg | ORAL_TABLET | Freq: Four times a day (QID) | ORAL | Status: DC | PRN
  Filled 2019-02-01: qty 2

## 2019-02-01 MED ORDER — PHENOL 1.4 % MT LIQD: 1.0000 | OROMUCOSAL | Status: DC | PRN

## 2019-02-01 MED ORDER — SODIUM CHLORIDE (PF) 0.9 % IJ SOLN
INTRAMUSCULAR | Status: AC
  Filled 2019-02-01: qty 50

## 2019-02-01 MED ORDER — POVIDONE-IODINE 10 % EX SWAB: 2.0000 "application " | Freq: Once | CUTANEOUS | Status: DC

## 2019-02-01 MED ORDER — ENSURE PRE-SURGERY PO LIQD
296.0000 mL | Freq: Once | ORAL | Status: AC
  Administered 2019-02-01: 296 mL via ORAL
  Filled 2019-02-01: qty 296

## 2019-02-01 MED ORDER — PROPOFOL 10 MG/ML IV BOLUS
INTRAVENOUS | Status: AC
  Filled 2019-02-01: qty 80

## 2019-02-01 MED ORDER — CEFAZOLIN SODIUM-DEXTROSE 2-4 GM/100ML-% IV SOLN
2.0000 g | INTRAVENOUS | Status: AC
  Administered 2019-02-01: 2 g via INTRAVENOUS
  Filled 2019-02-01: qty 100

## 2019-02-01 MED ORDER — DOCUSATE SODIUM 100 MG PO CAPS
100.0000 mg | ORAL_CAPSULE | Freq: Two times a day (BID) | ORAL | Status: DC
  Administered 2019-02-01 – 2019-02-03 (×4): 100 mg via ORAL
  Filled 2019-02-01 (×4): qty 1

## 2019-02-01 MED ORDER — MENTHOL 3 MG MT LOZG
1.0000 | LOZENGE | OROMUCOSAL | Status: DC | PRN
  Filled 2019-02-01: qty 9

## 2019-02-01 MED ORDER — METOCLOPRAMIDE HCL 5 MG/ML IJ SOLN: 5.0000 mg | Freq: Three times a day (TID) | INTRAMUSCULAR | Status: DC | PRN

## 2019-02-01 MED ORDER — FENTANYL CITRATE (PF) 100 MCG/2ML IJ SOLN
INTRAMUSCULAR | Status: DC | PRN
  Administered 2019-02-01: 25 ug via INTRAVENOUS

## 2019-02-01 SURGICAL SUPPLY — 58 items
BLADE CLIPPER SURG (BLADE) IMPLANT
CHLORAPREP W/TINT 26 (MISCELLANEOUS) ×3 IMPLANT
COVER SURGICAL LIGHT HANDLE (MISCELLANEOUS) ×3 IMPLANT
COVER WAND RF STERILE (DRAPES) IMPLANT
DERMABOND ADVANCED (GAUZE/BANDAGES/DRESSINGS) ×4
DERMABOND ADVANCED .7 DNX12 (GAUZE/BANDAGES/DRESSINGS) ×2 IMPLANT
DRAPE C-ARM 42X120 X-RAY (DRAPES) ×3 IMPLANT
DRAPE IMP U-DRAPE 54X76 (DRAPES) ×6 IMPLANT
DRAPE SHEET LG 3/4 BI-LAMINATE (DRAPES) ×6 IMPLANT
DRAPE STERI IOBAN 125X83 (DRAPES) ×3 IMPLANT
DRAPE TOP 10253 STERILE (DRAPES) ×3 IMPLANT
DRAPE U-SHAPE 47X51 STRL (DRAPES) ×9 IMPLANT
DRSG AQUACEL AG ADV 3.5X10 (GAUZE/BANDAGES/DRESSINGS) ×3 IMPLANT
ELECT NEEDLE TIP 2.8 STRL (NEEDLE) ×3 IMPLANT
ELECT PENCIL ROCKER SW 15FT (MISCELLANEOUS) IMPLANT
ELECT REM PT RETURN 15FT ADLT (MISCELLANEOUS) ×3 IMPLANT
EVACUATOR 1/8 PVC DRAIN (DRAIN) IMPLANT
GLOVE BIO SURGEON STRL SZ8.5 (GLOVE) ×18 IMPLANT
GLOVE BIOGEL PI IND STRL 8.5 (GLOVE) ×1 IMPLANT
GLOVE BIOGEL PI INDICATOR 8.5 (GLOVE) ×2
GOWN STRL REUS W/ TWL LRG LVL3 (GOWN DISPOSABLE) IMPLANT
GOWN STRL REUS W/TWL 2XL LVL3 (GOWN DISPOSABLE) ×12 IMPLANT
GOWN STRL REUS W/TWL LRG LVL3 (GOWN DISPOSABLE)
HANDPIECE INTERPULSE COAX TIP (DISPOSABLE) ×2
HEAD FEM UNIPOLAR 47 OD STRL (Hips) ×3 IMPLANT
HOOD PEEL AWAY FLYTE STAYCOOL (MISCELLANEOUS) ×12 IMPLANT
JET LAVAGE IRRISEPT WOUND (IRRIGATION / IRRIGATOR) ×3
KIT BASIN OR (CUSTOM PROCEDURE TRAY) ×3 IMPLANT
KIT TURNOVER KIT A (KITS) ×3 IMPLANT
LAVAGE JET IRRISEPT WOUND (IRRIGATION / IRRIGATOR) ×1 IMPLANT
MANIFOLD NEPTUNE II (INSTRUMENTS) ×3 IMPLANT
MARKER SKIN DUAL TIP RULER LAB (MISCELLANEOUS) ×3 IMPLANT
NEEDLE SPNL 18GX3.5 QUINCKE PK (NEEDLE) ×3 IMPLANT
NS IRRIG 1000ML POUR BTL (IV SOLUTION) ×3 IMPLANT
PACK ANTERIOR HIP CUSTOM (KITS) ×3 IMPLANT
PACK TOTAL JOINT (CUSTOM PROCEDURE TRAY) IMPLANT
SAW OSC TIP CART 19.5X105X1.3 (SAW) ×3 IMPLANT
SEALER BIPOLAR AQUA 6.0 (INSTRUMENTS) ×3 IMPLANT
SET HNDPC FAN SPRY TIP SCT (DISPOSABLE) ×1 IMPLANT
SPACER DEPUY (Hips) ×3 IMPLANT
STEM TRI LOC GRIPTION SZ 5 STD ×1 IMPLANT
SUCTION FRAZIER HANDLE 10FR (MISCELLANEOUS) ×2
SUCTION TUBE FRAZIER 10FR DISP (MISCELLANEOUS) ×1 IMPLANT
SUT ETHIBOND NAB CT1 #1 30IN (SUTURE) ×6 IMPLANT
SUT MNCRL AB 3-0 PS2 18 (SUTURE) ×3 IMPLANT
SUT MON AB 2-0 CT1 36 (SUTURE) ×3 IMPLANT
SUT STRATAFIX PDO 1 14 VIOLET (SUTURE) ×2
SUT STRATFX PDO 1 14 VIOLET (SUTURE) ×1
SUT VIC AB 1 CT1 27 (SUTURE) ×2
SUT VIC AB 1 CT1 27XBRD ANTBC (SUTURE) ×1 IMPLANT
SUT VIC AB 2-0 CT1 27 (SUTURE) ×2
SUT VIC AB 2-0 CT1 TAPERPNT 27 (SUTURE) ×1 IMPLANT
SUTURE STRATFX PDO 1 14 VIOLET (SUTURE) ×1 IMPLANT
SYR 50ML LL SCALE MARK (SYRINGE) ×3 IMPLANT
TOWEL OR 17X26 10 PK STRL BLUE (TOWEL DISPOSABLE) ×3 IMPLANT
TRAY FOLEY MTR SLVR 16FR STAT (SET/KITS/TRAYS/PACK) IMPLANT
TRI LOC GRIPTION SZ 5 STD ×3 IMPLANT
WATER STERILE IRR 1000ML POUR (IV SOLUTION) ×6 IMPLANT

## 2019-02-01 NOTE — Anesthesia Preprocedure Evaluation (Addendum)
Anesthesia Evaluation  Patient identified by MRN, date of birth, ID band Patient awake    Reviewed: Allergy & Precautions, NPO status , Patient's Chart, lab work & pertinent test results  History of Anesthesia Complications Negative for: history of anesthetic complications  Airway Mallampati: II  TM Distance: >3 FB Neck ROM: Full    Dental  (+) Edentulous Upper, Edentulous Lower   Pulmonary neg pulmonary ROS,    Pulmonary exam normal breath sounds clear to auscultation       Cardiovascular hypertension, Pt. on medications Normal cardiovascular exam+ dysrhythmias Atrial Fibrillation  Rhythm:Regular Rate:Normal  with CHF  - EF was 55-60% in December 2019, at which time there was mild AS and AI, moderate MR and LAE, and severe TR    Neuro/Psych PSYCHIATRIC DISORDERS Dementia TIA   GI/Hepatic negative GI ROS, Neg liver ROS,   Endo/Other  negative endocrine ROS  Renal/GU negative Renal ROS     Musculoskeletal negative musculoskeletal ROS (+)   Abdominal   Peds  Hematology negative hematology ROS (+)   Anesthesia Other Findings   Reproductive/Obstetrics                            Anesthesia Physical  Anesthesia Plan  ASA: III  Anesthesia Plan: Spinal   Post-op Pain Management:    Induction: Intravenous  PONV Risk Score and Plan: Ondansetron and Propofol infusion  Airway Management Planned: Natural Airway  Additional Equipment:   Intra-op Plan:   Post-operative Plan:   Informed Consent: I have reviewed the patients History and Physical, chart, labs and discussed the procedure including the risks, benefits and alternatives for the proposed anesthesia with the patient or authorized representative who has indicated his/her understanding and acceptance.     Consent reviewed with POA and Dental advisory given  Plan Discussed with: CRNA and Anesthesiologist  Anesthesia Plan  Comments: (Discussed with daughter 02/01/2019 at 1411 via phone. Consent given.)       Anesthesia Quick Evaluation

## 2019-02-01 NOTE — Progress Notes (Signed)
Despite the patients noted history of memory impairment she is alert and oriented to person, place, time and situation during my initial assessment. I called the patients POA to update her on the patients pending surgery this afternoon. Myself and the POA agreed the patient is safe to sign her own consent form. Patient understood the procedure and had no questions.

## 2019-02-01 NOTE — Consult Note (Signed)
ORTHOPAEDIC CONSULTATION  REQUESTING PHYSICIAN: Eugenie Filler, MD  PCP:  Aretta Nip, MD  Chief Complaint: Left hip fracture  HPI: Nichole [name] is a 85 y.o. female with dementia who sustained an unwitnessed ground-level fall at her memory care facility.  The next day, she was unable to weight-bear.  She was brought to the emergency department at [facility], where x-rays revealed a displaced left femoral neck fracture.  She is known to me for previous right hip hemiarthroplasty for femoral neck fracture.  She was admitted by the hospitalist service and underwent perioperative risk ratification medical optimization.  She was indicated for left hip hemiarthroplasty.  I discussed the situation with her daughter, who is actually on vacation in [facility]  Past Medical History:  Diagnosis Date   Alzheimer disease ([place]) 08/25/2017   Breast CA ([place])    HTN (hypertension)    Persistent atrial fibrillation 05/2018   TIA (transient ischemic attack) 2009   Past Surgical History:  Procedure Laterality Date   ANTERIOR APPROACH HEMI HIP ARTHROPLASTY Right 05/10/2018   Procedure: ANTERIOR APPROACH HEMI HIP ARTHROPLASTY;  Surgeon: Rod Can, MD;  Location: [facility];  Service: Orthopedics;  Laterality: Right;   BREAST SURGERY     MASTECTOMY Left 1995   Social History   Socioeconomic History   Marital status: Widowed    Spouse name: Not on file   Number of children: 2   Years of education: 59   Highest education level: Not on file  Occupational History   Occupation: Retired  Scientist, product/process development strain: Not on file   Food insecurity    Worry: Not on file    Inability: Not on Lexicographer needs    Medical: Not on file    Non-medical: Not on file  Tobacco Use   Smoking status: Never Smoker   Smokeless tobacco: Never Used  Substance and Sexual Activity   Alcohol use: No   Drug use: No   Sexual activity: Never      Birth control/protection: None  Lifestyle   Physical activity    Days per week: Not on file    Minutes per session: Not on file   Stress: Not on file  Relationships   Social connections    Talks on phone: Not on file    Gets together: Not on file    Attends religious service: Not on file    Active member of club or organization: Not on file    Attends meetings of clubs or organizations: Not on file    Relationship status: Not on file  Other Topics Concern   Not on file  Social History Narrative   Granddaughter live with her   Caffeine use: 1 serving per day, 1 coke per day   Right handed    Family History  Problem Relation Age of Onset   Ovarian cancer Sister    Breast cancer Sister    Colon cancer Mother    AAA (abdominal aortic aneurysm) Mother    Coronary artery disease Father    Allergies  Allergen Reactions   Tape Other (See Comments)    SKIN IS VERY THIN, SO IT WILL TEAR AND BRUISE EASILY!!   Prior to Admission medications   Medication Sig Start Date End Date Taking? Authorizing Provider  diltiazem (CARDIZEM CD) 120 MG 24 hr capsule TAKE 1 CAPSULE BY MOUTH EVERY DAY Patient taking differently: Take 120 mg by mouth every evening.  01/09/19  Yes Buford Dresser, MD  donepezil (ARICEPT) 5 MG tablet Take 5 mg by mouth daily.  04/23/18  Yes [provider]   Ct Head Wo Contrast  Result Date: 01/31/2019 CLINICAL DATA:  Status post fall yesterday. EXAM: CT HEAD WITHOUT CONTRAST CT CERVICAL SPINE WITHOUT CONTRAST TECHNIQUE: Multidetector CT imaging of the head and cervical spine was performed following the standard protocol without intravenous contrast. Multiplanar CT image reconstructions of the cervical spine were also generated. COMPARISON:  August 05, 2018 FINDINGS: CT HEAD FINDINGS Brain: No evidence of acute infarction, hemorrhage, hydrocephalus, extra-axial collection or mass lesion/mass effect. There is chronic diffuse atrophy. Chronic  bilateral periventricular white matter small vessel ischemic changes are noted. Old infarcts identified in the right basal ganglia and in the left cerebellum. Vascular: No hyperdense vessel is noted. Skull: Normal. Negative for fracture or focal lesion. Sinuses/Orbits: No acute finding. Other: None. CT CERVICAL SPINE FINDINGS Alignment: Normal. Skull base and vertebrae: No acute fracture. No primary bone lesion or focal pathologic process. Soft tissues and spinal canal: No prevertebral fluid or swelling. No visible canal hematoma. Disc levels: There are degenerative joint changes noted throughout the cervical spine with narrowed joint space and osteophyte formation. Partial fusion of C3 and C4 is noted. Upper chest: There is a spiculated nodule in the right apex measuring 1.1 cm. Other: None. IMPRESSION: No focal acute intracranial abnormality identified. No acute fracture or dislocation of cervical spine. Spiculated nodule in the right apex measuring 1.1 cm. Neoplasm is not excluded. Further evaluation with a chest CT on outpatient basis is recommended. Electronically Signed   By: Abelardo Diesel M.D.   On: 01/31/2019 15:00   Ct Cervical Spine Wo Contrast  Result Date: 01/31/2019 CLINICAL DATA:  Status post fall yesterday. EXAM: CT HEAD WITHOUT CONTRAST CT CERVICAL SPINE WITHOUT CONTRAST TECHNIQUE: Multidetector CT imaging of the head and cervical spine was performed following the standard protocol without intravenous contrast. Multiplanar CT image reconstructions of the cervical spine were also generated. COMPARISON:  August 05, 2018 FINDINGS: CT HEAD FINDINGS Brain: No evidence of acute infarction, hemorrhage, hydrocephalus, extra-axial collection or mass lesion/mass effect. There is chronic diffuse atrophy. Chronic bilateral periventricular white matter small vessel ischemic changes are noted. Old infarcts identified in the right basal ganglia and in the left cerebellum. Vascular: No hyperdense vessel is  noted. Skull: Normal. Negative for fracture or focal lesion. Sinuses/Orbits: No acute finding. Other: None. CT CERVICAL SPINE FINDINGS Alignment: Normal. Skull base and vertebrae: No acute fracture. No primary bone lesion or focal pathologic process. Soft tissues and spinal canal: No prevertebral fluid or swelling. No visible canal hematoma. Disc levels: There are degenerative joint changes noted throughout the cervical spine with narrowed joint space and osteophyte formation. Partial fusion of C3 and C4 is noted. Upper chest: There is a spiculated nodule in the right apex measuring 1.1 cm. Other: None. IMPRESSION: No focal acute intracranial abnormality identified. No acute fracture or dislocation of cervical spine. Spiculated nodule in the right apex measuring 1.1 cm. Neoplasm is not excluded. Further evaluation with a chest CT on outpatient basis is recommended. Electronically Signed   By: Abelardo Diesel M.D.   On: 01/31/2019 15:00   Dg Chest Port 1 View  Result Date: 01/31/2019 CLINICAL DATA:  Preoperative assessment for hip fracture. EXAM: PORTABLE CHEST 1 VIEW COMPARISON:  June 21, 2018 FINDINGS: There is a moderate pleural effusion on the left with a much smaller pleural effusion on the right. There is mild cardiomegaly  with pulmonary venous hypertension. There is mild hazy opacity in the upper lobes which likely represents mild edema. No frank consolidation evident. No pneumothorax. No adenopathy. No bone lesions. IMPRESSION: Pulmonary vascular congestion with a likely degree of congestive heart failure. Pleural effusions bilaterally, larger on the left than on the right. No frank consolidation. Electronically Signed   By: Lowella Grip III M.D.   On: 01/31/2019 16:50   Dg Hips Bilat W Or Wo Pelvis 3-4 Views  Result Date: 01/31/2019 CLINICAL DATA:  Status post fall yesterday with left hip pain. EXAM: DG HIP (WITH OR WITHOUT PELVIS) 3-4V BILAT COMPARISON:  None. FINDINGS: There is displaced  fracture of the left femoral neck. No other acute fracture or dislocation is identified. There is a right hip replacement without malalignment. IMPRESSION: Displaced fracture of the left femoral neck. Electronically Signed   By: Abelardo Diesel M.D.   On: 01/31/2019 15:06    Positive ROS: All other systems have been reviewed and were otherwise negative with the exception of those mentioned in the HPI and as above.  Physical Exam: General: Alert, no acute distress Cardiovascular: No pedal edema Respiratory: No cyanosis, no use of accessory musculature GI: No organomegaly, abdomen is soft and non-tender Skin: No lesions in the area of chief complaint Neurologic: Sensation intact distally Psychiatric: Patient is competent for consent with normal mood and affect Lymphatic: No axillary or cervical lymphadenopathy  MUSCULOSKELETAL: Examination of the left hip reveals no skin wounds or lesions.  She has some bruising over the lateral hip.  She is shortened and rotated.  She has pain with logrolling.  She has palpable pedal pulses.  Positive motor function dorsiflexion, plantarflexion, great toe extension.  She reports intact sensation.  Assessment: Displaced left femoral neck fracture.  Plan: I recommend left hip hemiarthroplasty to allow for pain control and mobilization.  The risk, benefits, and alternatives were discussed with the patient and her daughter over the phone.  Obviously, this is her second fall in the past couple of months and that is not a good prognostic indicator for her future.  Proceed with surgery today.  All questions answered.    Bertram Savin, MD Cell [phone]    02/01/2019 3:01 PM

## 2019-02-01 NOTE — Anesthesia Postprocedure Evaluation (Signed)
Anesthesia Post Note  Patient: Nichole [name]  Procedure(s) Performed: ANTERIOR APPROACH HEMI HIP ARTHROPLASTY (Left )     Patient location during evaluation: Mother Baby Anesthesia Type: Spinal Level of consciousness: oriented and awake and alert Pain management: pain level controlled Vital Signs Assessment: post-procedure vital signs reviewed and stable Respiratory status: spontaneous breathing and respiratory function stable Cardiovascular status: blood pressure returned to baseline and stable Postop Assessment: no headache, no backache, no apparent nausea or vomiting and able to ambulate Anesthetic complications: no    Last Vitals:  Vitals:   02/01/19 2000 02/01/19 2028  BP:  129/73  Pulse:  93  Resp:    Temp:    SpO2: 96% 93%    Last Pain:  Vitals:   02/01/19 1930  TempSrc:   PainSc: 0-No pain                 Dynastie Knoop DANIEL

## 2019-02-01 NOTE — ED Notes (Signed)
Pt moved onto a hospital bed with assistance from [place] and [place], [place]

## 2019-02-01 NOTE — Progress Notes (Addendum)
Daughter updated pt being transferred to tele, daughter reports pt has a cardiologist with [place], Bridgette Barstow

## 2019-02-01 NOTE — Progress Notes (Signed)
PROGRESS NOTE    Nichole [name]  NFA:[id number] DOB: Mar 27, 1923 DOA: 01/31/2019 PCP: Aretta Nip, MD    Brief Narrative:  HPI per Dr. Rolm Gala is a 85 y.o. female with medical history significant for atrial fibrillation no longer on anticoagulation, Alzheimer dementia, and hypertension, now presenting to emergency department with left hip pain after a fall yesterday.  Patient does not remember falling but acknowledges pain in the left leg.  She denies any chest pain, headache, or shortness of breath.  [facility] to the [facility] today when the patient was complaining of left leg and hip pain.  She was placed in a cervical collar and transported to the ED.  ED Course: Upon arrival to the ED, patient is found to be afebrile, saturating mid 90s on room air, and with remaining vitals also stable.  EKG features atrial fibrillation with rate 91.  Noncontrast head CT is negative for acute intracranial abnormality and cervical spine CT negative for fracture or dislocation.  Chest x-ray notable for spiculated nodule at the right apex, pulmonary vascular congestion, and bilateral pleural effusions.  Radiographs of the hips demonstrate displaced left femoral neck fracture.  Chemistry panel notable for sodium of 132.  CBC features a mild leukocytosis.  BNP is elevated 522.  COVID-19 screening test is negative.  Orthopedic surgery was consulted by the ED physician and recommends medical admission.  Assessment & Plan:   Principal Problem:   Closed left hip fracture, initial encounter ([place]) Active Problems:   Acute on chronic diastolic CHF (congestive heart failure) (HCC)   Hyponatremia   HTN (hypertension)   Alzheimer disease (HCC)   Persistent atrial fibrillation   CKD (chronic kidney disease), stage III (HCC)   Lung nodule < 6cm on CT  1 left hip fracture Secondary to mechanical fall at SNF.  Plain films which were done revealed left femoral neck fracture.  Orthopedics  consulted.  Patient made n.p.o. after midnight, fluid status to be optimized, continue pain management, supportive care.  2.  Acute on chronic diastolic heart failure Patient noted to have some peripheral edema and some diffuse crackles on examination.  Elevated BNP.  Chest x-ray findings consistent with CHF.  2D echo from December 2019 with a EF of 55 to 60% at this time there was mild left ear and AI, moderate MR and LAE and severe TR.  Patient given a dose of IV Lasix.  Urine output not recorded.  Will place on Lasix 20 mg IV every 12 hours.  Strict I's and O's.  Daily weights.  Patient noted not to be on diuretics on home med list.  May need to be discharged on oral diuretics. Follow.  3.  Persistent atrial fibrillation [id number]  Score 7.  Currently rate controlled on diltiazem.  Patient not a anticoagulation candidate due to history of recurrent falls.  Follow.  4.  Dementia Stable.  Continue Aricept.  Fall precautions.  5.  Hyponatremia Likely secondary to hypovolemic hyponatremia.  Sodium was 132 on admission.  Patient given a dose of IV Lasix.  Continue IV Lasix 20 mg every 12 hours secondary to problem #2.  Follow.  6.  Lung nodule Spiculated 1.1 cm nodule centrally noted on right apex on preop chest x-ray.  Will need outpatient follow-up with pulmonary.  7.  Chronic kidney disease stage III Patient's serum creatinine is 0.95 on admission.  Better than prior creatinines likely secondary to hypervolemia.  Patient status post dose of IV Lasix.  Patient  now on Lasix 20 mg IV every 12 hours secondary to problem #2.  Monitor renal function closely.  Follow.   DVT prophylaxis: Per orthopedics. Code Status: Full Family Communication: Updated patient.  No family at bedside. Disposition Plan: To be determined.   Consultants:   Orthopedics: Dr. Lyla Glassing 02/01/2019  Procedures:   Plain films of the right hip and pelvis 01/31/2019  CT head CT C-spine  01/31/2019  Antimicrobials:  None   Subjective: Patient laying in bed.  Denies any chest pain or shortness of breath.  Denies any abdominal pain.  Denies any right hip pain.  States she is fine.  Objective: Vitals:   02/01/19 0700 02/01/19 0900 02/01/19 0931 02/01/19 1053  BP: 133/68 113/62 (!) 149/75   Pulse: (!) 103 83 82   Resp: 17 17 14    Temp:   98.9 F (37.2 C)   TempSrc:      SpO2: 91% 96% 95%   Weight:    57.1 kg    Intake/Output Summary (Last 24 hours) at 02/01/2019 1058 Last data filed at 02/01/2019 1013 Gross per 24 hour  Intake 0 ml  Output 400 ml  Net -400 ml   Filed Weights   02/01/19 1053  Weight: 57.1 kg    Examination:  General exam: Appears calm and comfortable  Respiratory system: Diffuse crackles. Respiratory effort normal. Cardiovascular system: S1 & S2 heard, RRR. No JVD, murmurs, rubs, gallops or clicks.  1+ bilateral lower extremity edema. Gastrointestinal system: Abdomen is nondistended, soft and nontender. No organomegaly or masses felt. Normal bowel sounds heard. Central nervous system: Alert and oriented. No focal neurological deficits. Extremities: Symmetric 5 x 5 power. Skin: No rashes, lesions or ulcers Psychiatry: Judgement and insight appear normal. Mood & affect appropriate.     Data Reviewed: I have personally reviewed following labs and imaging studies  CBC: Recent Labs  Lab 01/31/19 1716 02/01/19 1016  WBC 11.1* 11.3*  NEUTROABS 9.8*  --   HGB 12.6 12.8  HCT 40.1 40.0  MCV 99.8 100.5*  PLT 213 229   Basic Metabolic Panel: Recent Labs  Lab 01/31/19 1716 02/01/19 1016  NA 132* 132*  K 4.1 3.8  CL 93* 93*  CO2 27 26  GLUCOSE 121* 94  BUN 15 16  CREATININE 0.95 0.84  CALCIUM 8.9 8.7*  MG  --  1.9   GFR: Estimated Creatinine Clearance: 35.4 mL/min (by C-G formula based on SCr of 0.84 mg/dL). Liver Function Tests: Recent Labs  Lab 01/31/19 1716  AST 25  ALT 26  ALKPHOS 108  BILITOT 1.6*  PROT 6.9   ALBUMIN 3.5   No results for input(s): LIPASE, AMYLASE in the last 168 hours. No results for input(s): AMMONIA in the last 168 hours. Coagulation Profile: No results for input(s): INR, PROTIME in the last 168 hours. Cardiac Enzymes: No results for input(s): CKTOTAL, CKMB, CKMBINDEX, TROPONINI in the last 168 hours. BNP (last 3 results) No results for input(s): PROBNP in the last 8760 hours. HbA1C: No results for input(s): HGBA1C in the last 72 hours. CBG: No results for input(s): GLUCAP in the last 168 hours. Lipid Profile: No results for input(s): CHOL, HDL, LDLCALC, TRIG, CHOLHDL, LDLDIRECT in the last 72 hours. Thyroid Function Tests: No results for input(s): TSH, T4TOTAL, FREET4, T3FREE, THYROIDAB in the last 72 hours. Anemia Panel: No results for input(s): VITAMINB12, FOLATE, FERRITIN, TIBC, IRON, RETICCTPCT in the last 72 hours. Sepsis Labs: No results for input(s): PROCALCITON, LATICACIDVEN in the last 168 hours.  Recent Results (from the past 240 hour(s))  SARS Coronavirus 2 (CEPHEID - Performed in [place] hospital lab), Hosp Order     Status: None   Collection Time: 01/31/19  5:16 PM   Specimen: Nasopharyngeal Swab  Result Value Ref Range Status   SARS Coronavirus 2 NEGATIVE NEGATIVE Final    Comment: (NOTE) If result is NEGATIVE SARS-CoV-2 target nucleic acids are NOT DETECTED. The SARS-CoV-2 RNA is generally detectable in upper and lower  respiratory specimens during the acute phase of infection. The lowest  concentration of SARS-CoV-2 viral copies this assay can detect is 250  copies / mL. A negative result does not preclude SARS-CoV-2 infection  and should not be used as the sole basis for treatment or other  patient management decisions.  A negative result may occur with  improper specimen collection / handling, submission of specimen other  than nasopharyngeal swab, presence of viral mutation(s) within the  areas targeted by this assay, and inadequate  number of viral copies  (<250 copies / mL). A negative result must be combined with clinical  observations, patient history, and epidemiological information. If result is POSITIVE SARS-CoV-2 target nucleic acids are DETECTED. The SARS-CoV-2 RNA is generally detectable in upper and lower  respiratory specimens dur ing the acute phase of infection.  Positive  results are indicative of active infection with SARS-CoV-2.  Clinical  correlation with patient history and other diagnostic information is  necessary to determine patient infection status.  Positive results do  not rule out bacterial infection or co-infection with other viruses. If result is PRESUMPTIVE POSTIVE SARS-CoV-2 nucleic acids MAY BE PRESENT.   A presumptive positive result was obtained on the submitted specimen  and confirmed on repeat testing.  While 2019 novel coronavirus  (SARS-CoV-2) nucleic acids may be present in the submitted sample  additional confirmatory testing may be necessary for epidemiological  and / or clinical management purposes  to differentiate between  SARS-CoV-2 and other Sarbecovirus currently known to infect humans.  If clinically indicated additional testing with an alternate test  methodology [phone]) is advised. The SARS-CoV-2 RNA is generally  detectable in upper and lower respiratory sp ecimens during the acute  phase of infection. The expected result is Negative. Fact Sheet for Patients:  StrictlyIdeas.no Fact Sheet for Healthcare Providers: BankingDealers.co.za This test is not yet approved or cleared by the [place] FDA and has been authorized for detection and/or diagnosis of SARS-CoV-2 by FDA under an Emergency Use Authorization (EUA).  This EUA will remain in effect (meaning this test can be used) for the duration of the COVID-19 declaration under Section 564(b)(1) of the Act, 21 U.S.C. section 360bbb-3(b)(1), unless the  authorization is terminated or revoked sooner. Performed at [facility], [place] [address], [place], [place] [address]          Radiology Studies: Ct Head Wo Contrast  Result Date: 01/31/2019 CLINICAL DATA:  Status post fall yesterday. EXAM: CT HEAD WITHOUT CONTRAST CT CERVICAL SPINE WITHOUT CONTRAST TECHNIQUE: Multidetector CT imaging of the head and cervical spine was performed following the standard protocol without intravenous contrast. Multiplanar CT image reconstructions of the cervical spine were also generated. COMPARISON:  August 05, 2018 FINDINGS: CT HEAD FINDINGS Brain: No evidence of acute infarction, hemorrhage, hydrocephalus, extra-axial collection or mass lesion/mass effect. There is chronic diffuse atrophy. Chronic bilateral periventricular white matter small vessel ischemic changes are noted. Old infarcts identified in the right basal ganglia and in the left cerebellum. Vascular: No hyperdense  vessel is noted. Skull: Normal. Negative for fracture or focal lesion. Sinuses/Orbits: No acute finding. Other: None. CT CERVICAL SPINE FINDINGS Alignment: Normal. Skull base and vertebrae: No acute fracture. No primary bone lesion or focal pathologic process. Soft tissues and spinal canal: No prevertebral fluid or swelling. No visible canal hematoma. Disc levels: There are degenerative joint changes noted throughout the cervical spine with narrowed joint space and osteophyte formation. Partial fusion of C3 and C4 is noted. Upper chest: There is a spiculated nodule in the right apex measuring 1.1 cm. Other: None. IMPRESSION: No focal acute intracranial abnormality identified. No acute fracture or dislocation of cervical spine. Spiculated nodule in the right apex measuring 1.1 cm. Neoplasm is not excluded. Further evaluation with a chest CT on outpatient basis is recommended. Electronically Signed   By: Abelardo Diesel M.D.   On: 01/31/2019 15:00   Ct Cervical Spine Wo  Contrast  Result Date: 01/31/2019 CLINICAL DATA:  Status post fall yesterday. EXAM: CT HEAD WITHOUT CONTRAST CT CERVICAL SPINE WITHOUT CONTRAST TECHNIQUE: Multidetector CT imaging of the head and cervical spine was performed following the standard protocol without intravenous contrast. Multiplanar CT image reconstructions of the cervical spine were also generated. COMPARISON:  August 05, 2018 FINDINGS: CT HEAD FINDINGS Brain: No evidence of acute infarction, hemorrhage, hydrocephalus, extra-axial collection or mass lesion/mass effect. There is chronic diffuse atrophy. Chronic bilateral periventricular white matter small vessel ischemic changes are noted. Old infarcts identified in the right basal ganglia and in the left cerebellum. Vascular: No hyperdense vessel is noted. Skull: Normal. Negative for fracture or focal lesion. Sinuses/Orbits: No acute finding. Other: None. CT CERVICAL SPINE FINDINGS Alignment: Normal. Skull base and vertebrae: No acute fracture. No primary bone lesion or focal pathologic process. Soft tissues and spinal canal: No prevertebral fluid or swelling. No visible canal hematoma. Disc levels: There are degenerative joint changes noted throughout the cervical spine with narrowed joint space and osteophyte formation. Partial fusion of C3 and C4 is noted. Upper chest: There is a spiculated nodule in the right apex measuring 1.1 cm. Other: None. IMPRESSION: No focal acute intracranial abnormality identified. No acute fracture or dislocation of cervical spine. Spiculated nodule in the right apex measuring 1.1 cm. Neoplasm is not excluded. Further evaluation with a chest CT on outpatient basis is recommended. Electronically Signed   By: Abelardo Diesel M.D.   On: 01/31/2019 15:00   Dg Chest Port 1 View  Result Date: 01/31/2019 CLINICAL DATA:  Preoperative assessment for hip fracture. EXAM: PORTABLE CHEST 1 VIEW COMPARISON:  June 21, 2018 FINDINGS: There is a moderate pleural effusion on the  left with a much smaller pleural effusion on the right. There is mild cardiomegaly with pulmonary venous hypertension. There is mild hazy opacity in the upper lobes which likely represents mild edema. No frank consolidation evident. No pneumothorax. No adenopathy. No bone lesions. IMPRESSION: Pulmonary vascular congestion with a likely degree of congestive heart failure. Pleural effusions bilaterally, larger on the left than on the right. No frank consolidation. Electronically Signed   By: Lowella Grip III M.D.   On: 01/31/2019 16:50   Dg Hips Bilat W Or Wo Pelvis 3-4 Views  Result Date: 01/31/2019 CLINICAL DATA:  Status post fall yesterday with left hip pain. EXAM: DG HIP (WITH OR WITHOUT PELVIS) 3-4V BILAT COMPARISON:  None. FINDINGS: There is displaced fracture of the left femoral neck. No other acute fracture or dislocation is identified. There is a right hip replacement without malalignment. IMPRESSION: Displaced fracture  of the left femoral neck. Electronically Signed   By: Abelardo Diesel M.D.   On: 01/31/2019 15:06        Scheduled Meds:  chlorhexidine  60 mL Topical Once   diltiazem  120 mg Oral QPM   donepezil  5 mg Oral Daily   furosemide  20 mg Intravenous Q12H   povidone-iodine  2 application Topical Once   povidone-iodine  2 application Topical Once   Continuous Infusions:   ceFAZolin (ANCEF) IV     tranexamic acid       LOS: 1 day    Time spent: 35 minutes    Irine Seal, MD [facility]  If 7PM-7AM, please contact night-coverage [email or web address] 02/01/2019, 10:58 AM

## 2019-02-01 NOTE — Progress Notes (Signed)
Patient arrives from [facility] for surgery today. Patient has signed consent form; however, she does not know year ("1990") and she states she lives alone. There is an information sheet in chart from [facility]. Called and left message with [name] , her daughter, to [facility].

## 2019-02-01 NOTE — Transfer of Care (Signed)
Immediate Anesthesia Transfer of Care Note  Patient: Nichole [name]  Procedure(s) Performed: Procedure(s): ANTERIOR APPROACH HEMI HIP ARTHROPLASTY (Left)  Patient Location: PACU  Anesthesia Type:General  Level of Consciousness:  sedated, patient cooperative and responds to stimulation  Airway & Oxygen Therapy:Patient Spontanous Breathing and Patient connected to face mask oxgen  Post-op Assessment:  Report given to PACU RN and Post -op Vital signs reviewed and stable  Post vital signs:  Reviewed and stable  Last Vitals:  Vitals:   02/01/19 0931 02/01/19 1304  BP: (!) 149/75 140/64  Pulse: 82 93  Resp: 14 14  Temp: 37.2 C 36.6 C  SpO2: 27% 73%    Complications: No apparent anesthesia complications

## 2019-02-01 NOTE — Discharge Instructions (Signed)
°Dr. Hong Moring °Joint Replacement Specialist °[facility] °[address] °[place], [place] [address] °[phone] ° ° °TOTAL HIP REPLACEMENT POSTOPERATIVE DIRECTIONS ° ° ° °Hip Rehabilitation, Guidelines Following Surgery  ° °WEIGHT BEARING °Weight bearing as tolerated with assist device (walker, cane, etc) as directed, use it as long as suggested by your surgeon or therapist, typically at least 4-6 weeks. ° °The results of a hip operation are greatly improved after range of motion and muscle strengthening exercises. Follow all safety measures which are given to protect your hip. If any of these exercises cause increased pain or swelling in your joint, decrease the amount until you are comfortable again. Then slowly increase the exercises. Call your caregiver if you have problems or questions.  ° °HOME CARE INSTRUCTIONS  °Most of the following instructions are designed to prevent the dislocation of your new hip.  °Remove items at home which could result in a fall. This includes throw rugs or furniture in walking pathways.  °Continue medications as instructed at time of discharge. °· You may have some home medications which will be placed on hold until you complete the course of blood thinner medication. °· You may start showering once you are discharged home. Do not remove your dressing. °Do not put on socks or shoes without following the instructions of your caregivers.   °Sit on chairs with arms. Use the chair arms to help push yourself up when arising.  °Arrange for the use of a toilet seat elevator so you are not sitting low.  °· Walk with walker as instructed.  °You may resume a sexual relationship in one month or when given the OK by your caregiver.  °Use walker as long as suggested by your caregivers.  °You may put full weight on your legs and walk as much as is comfortable. °Avoid periods of inactivity such as sitting longer than an hour when not asleep. This helps prevent  blood clots.  °You may return to work once you are cleared by your surgeon.  °Do not drive a car for 6 weeks or until released by your surgeon.  °Do not drive while taking narcotics.  °Wear elastic stockings for two weeks following surgery during the day but you may remove then at night.  °Make sure you keep all of your appointments after your operation with all of your doctors and caregivers. You should call the office at the above phone number and make an appointment for approximately two weeks after the date of your surgery. °Please pick up a stool softener and laxative for home use as long as you are requiring pain medications. °· ICE to the affected hip every three hours for 30 minutes at a time and then as needed for pain and swelling. Continue to use ice on the hip for pain and swelling from surgery. You may notice swelling that will progress down to the foot and ankle.  This is normal after surgery.  Elevate the leg when you are not up walking on it.   °It is important for you to complete the blood thinner medication as prescribed by your doctor. °· Continue to use the breathing machine which will help keep your temperature down.  It is common for your temperature to cycle up and down following surgery, especially at night when you are not up moving around and exerting yourself.  The breathing machine keeps your lungs expanded and your temperature down. ° °RANGE OF MOTION AND STRENGTHENING EXERCISES  °These exercises are   designed to help you keep full movement of your hip joint. Follow your caregiver's or physical therapist's instructions. Perform all exercises about fifteen times, three times per day or as directed. Exercise both hips, even if you have had only one joint replacement. These exercises can be done on a training (exercise) mat, on the floor, on a table or on a bed. Use whatever works the best and is most comfortable for you. Use music or television while you are exercising so that the exercises  are a pleasant break in your day. This will make your life better with the exercises acting as a break in routine you can look forward to.  °Lying on your back, slowly slide your foot toward your buttocks, raising your knee up off the floor. Then slowly slide your foot back down until your leg is straight again.  °Lying on your back spread your legs as far apart as you can without causing discomfort.  °Lying on your side, raise your upper leg and foot straight up from the floor as far as is comfortable. Slowly lower the leg and repeat.  °Lying on your back, tighten up the muscle in the front of your thigh (quadriceps muscles). You can do this by keeping your leg straight and trying to raise your heel off the floor. This helps strengthen the largest muscle supporting your knee.  °Lying on your back, tighten up the muscles of your buttocks both with the legs straight and with the knee bent at a comfortable angle while keeping your heel on the floor.  ° °SKILLED REHAB INSTRUCTIONS: °If the patient is transferred to a skilled rehab facility following release from the hospital, a list of the current medications will be sent to the facility for the patient to continue.  When discharged from the skilled rehab facility, please have the facility set up the patient's Home Health Physical Therapy prior to being released. Also, the skilled facility will be responsible for providing the patient with their medications at time of release from the facility to include their pain medication and their blood thinner medication. If the patient is still at the rehab facility at time of the two week follow up appointment, the skilled rehab facility will also need to assist the patient in arranging follow up appointment in our office and any transportation needs. ° °MAKE SURE YOU:  °Understand these instructions.  °Will watch your condition.  °Will get help right away if you are not doing well or get worse. ° °Pick up stool softner and  laxative for home use following surgery while on pain medications. °Do not remove your dressing. °The dressing is waterproof--it is OK to take showers. °Continue to use ice for pain and swelling after surgery. °Do not use any lotions or creams on the incision until instructed by your surgeon. °Total Hip Protocol. ° ° °

## 2019-02-01 NOTE — Progress Notes (Signed)
Initial Nutrition Assessment  RD working remotely.   DOCUMENTATION CODES:   Not applicable  INTERVENTION:  - diet advancement as medically feasible.  - will order Ensure Enlive BID, each supplement provides 350 kcal and 20 grams of protein. - will order daily multivitamin with minerals.   NUTRITION DIAGNOSIS:   Increased nutrient needs related to post-op healing as evidenced by estimated needs.  GOAL:   Patient will meet greater than or equal to 90% of their needs  MONITOR:   PO intake, Supplement acceptance, Labs, Weight trends  REASON FOR ASSESSMENT:   Consult Hip fracture protocol  ASSESSMENT:   85 y.o. female with medical history significant for afib no longer on anticoagulation, Alzheimer's dementia, and HTN. She presented to the ED with L hip pain after a fall the day prior. She resides at a SNF.  She was on a Heart Healthy diet with 1.5L fluid restriction from 7/28 at 7:55 PM until midnight and has been NPO since midnight. RN flow sheet indicates that patient is a/o to self only. Patient was in the ED when RD consult was placed last night, in the OR earlier, and is currently in PACU.   Per chart review, current weight is 126 lb and weight on 2/6 was 131 lb. This indicates 5 lb weight loss in 4 months; not significant for time frame.   Per notes: - L hip fx  - acute on CHF - afib - hx of dementia - lung nodule with outpatient follow-up recommended    Labs reviewed; Na: 132 mmol/l, Cl: 93 mmol/l, Ca: 8.7 mg/dl. Medications reviewed; 20 mg IV lasix BID,  IVF; LR @ 75 ml/hr.      NUTRITION - FOCUSED PHYSICAL EXAM:  unable to complete at this time.   Diet Order:   Diet Order            Diet NPO time specified Except for: Sips with Meds  Diet effective midnight        Diet NPO time specified Except for: Ice Chips  Diet effective midnight              EDUCATION NEEDS:   No education needs have been identified at this time  Skin:  Skin  Assessment: Skin Integrity Issues: Skin Integrity Issues:: Incisions Incisions: L hip (7/29)  Last BM:  PTA/unknown  Height:   Ht Readings from Last 1 Encounters:  02/01/19 5\' 4"  (1.626 m)    Weight:   Wt Readings from Last 1 Encounters:  02/01/19 57.1 kg    Ideal Body Weight:  54.5 kg  BMI:  Body mass index is 21.61 kg/m.  Estimated Nutritional Needs:   Kcal:  1425-1655 kcal  Protein:  70-80 grams  Fluid:  >/= 1.7 L/day     Jarome Matin, MS, RD, LDN, [facility] Inpatient Clinical Dietitian Pager # [phone] After hours/weekend pager # [phone]

## 2019-02-01 NOTE — ED Notes (Signed)
ED TO INPATIENT HANDOFF REPORT  ED Nurse Name and Phone #: [phone]  S Name/Age/Gender Nichole [name] 85 y.o. female Room/Bed: WA25/WA25  Code Status   Code Status: Full Code  Home/SNF/Other Nursing Home Patient oriented to: self Is this baseline? Yes   Triage Complete: Triage complete  Chief Complaint fall left hip pain  Triage Note Pt reportedly fell yesterday. Pt is from [facility]. Pt has no formal diagnosis of dementia but has some "memory impairment" per facility.  Pt reportedly has left leg/hip pain and they decided to send her out today.  No rotation noted per EMS.  Pt denies bloodthinners.  164/84 90 HR (Hx of afib) RR 16 133 CBG    Allergies Allergies  Allergen Reactions  . Tape Other (See Comments)    SKIN IS VERY THIN, SO IT WILL TEAR AND BRUISE EASILY!!    Level of Care/Admitting Diagnosis ED Disposition    ED Disposition Condition [facility] Area: [place] [[id number]]  Level of Care: Med-Surg [16]  Covid Evaluation: Confirmed COVID Negative  Diagnosis: Closed left hip fracture, initial encounter [facility]) [[id number]]  Admitting Physician: Vianne Bulls [[id number]]  Attending Physician: Vianne Bulls [[id number]]  Estimated length of stay: past midnight tomorrow  Certification:: I certify this patient will need inpatient services for at least 2 midnights  PT Class (Do Not Modify): Inpatient [101]  PT Acc Code (Do Not Modify): Private [1]       B Medical/Surgery History Past Medical History:  Diagnosis Date  . Alzheimer disease ([place]) 08/25/2017  . Breast CA ([place])   . HTN (hypertension)   . Persistent atrial fibrillation 05/2018  . TIA (transient ischemic attack) 2009   Past Surgical History:  Procedure Laterality Date  . ANTERIOR APPROACH HEMI HIP ARTHROPLASTY Right 05/10/2018   Procedure: ANTERIOR APPROACH HEMI HIP ARTHROPLASTY;  Surgeon: Rod Can, MD;  Location: [facility] ORS;  Service: Orthopedics;   Laterality: Right;  . BREAST SURGERY    . MASTECTOMY Left 1995     A IV Location/Drains/Wounds Patient Lines/Drains/Airways Status   Active Line/Drains/Airways    Name:   Placement date:   Placement time:   Site:   Days:   Peripheral IV 01/31/19 Left Forearm   01/31/19    1647    Forearm   1   Urethral Catheter Mayer Camel, RN  Double-lumen;Latex 16 Fr.   02/01/19    0500    Double-lumen;Latex   less than 1   Incision (Closed) 05/10/18 Hip Right   05/10/18    1346     267          Intake/Output Last 24 hours No intake or output data in the 24 hours ending 02/01/19 0914  Labs/Imaging Results for orders placed or performed during the hospital encounter of 01/31/19 (from the past 48 hour(s))  Urinalysis, Routine w reflex microscopic     Status: Abnormal   Collection Time: 01/31/19  3:24 PM  Result Value Ref Range   Color, Urine YELLOW YELLOW   APPearance HAZY (A) CLEAR   Specific Gravity, Urine 1.019 1.005 - 1.030   pH 6.0 5.0 - 8.0   Glucose, UA 50 (A) NEGATIVE mg/dL   Hgb urine dipstick MODERATE (A) NEGATIVE   Bilirubin Urine NEGATIVE NEGATIVE   Ketones, ur 5 (A) NEGATIVE mg/dL   Protein, ur 30 (A) NEGATIVE mg/dL   Nitrite NEGATIVE NEGATIVE   Leukocytes,Ua NEGATIVE NEGATIVE   RBC / HPF 21-50 0 -  5 RBC/hpf   WBC, UA 11-20 0 - 5 WBC/hpf   Bacteria, UA RARE (A) NONE SEEN   Squamous Epithelial / LPF 11-20 0 - 5   Mucus PRESENT    Non Squamous Epithelial 0-5 (A) NONE SEEN    Comment: Performed at [facility], [place] [address] [place], [place] [address]  Comprehensive metabolic panel     Status: Abnormal   Collection Time: 01/31/19  5:16 PM  Result Value Ref Range   Sodium 132 (L) 135 - 145 mmol/L   Potassium 4.1 3.5 - 5.1 mmol/L   Chloride 93 (L) 98 - 111 mmol/L   CO2 27 22 - 32 mmol/L   Glucose, Bld 121 (H) 70 - 99 mg/dL   BUN 15 8 - 23 mg/dL   Creatinine, Ser 0.95 0.44 - 1.00 mg/dL   Calcium 8.9 8.9 - 10.3 mg/dL   Total Protein 6.9 6.5 - 8.1 g/dL    Albumin 3.5 3.5 - 5.0 g/dL   AST 25 15 - 41 U/L   ALT 26 0 - 44 U/L   Alkaline Phosphatase 108 38 - 126 U/L   Total Bilirubin 1.6 (H) 0.3 - 1.2 mg/dL   GFR calc non Af Amer 51 (L) >60 mL/min   GFR calc Af Amer 59 (L) >60 mL/min   Anion gap 12 5 - 15    Comment: Performed at [facility], [place] [address] [place], [place] [address]  CBC with Differential/Platelet     Status: Abnormal   Collection Time: 01/31/19  5:16 PM  Result Value Ref Range   WBC 11.1 (H) 4.0 - 10.5 K/uL   RBC 4.02 3.87 - 5.11 MIL/uL   Hemoglobin 12.6 12.0 - 15.0 g/dL   HCT 40.1 36.0 - 46.0 %   MCV 99.8 80.0 - 100.0 fL   MCH 31.3 26.0 - 34.0 pg   MCHC 31.4 30.0 - 36.0 g/dL   RDW 14.6 11.5 - 15.5 %   Platelets 213 150 - 400 K/uL   nRBC 0.0 0.0 - 0.2 %   Neutrophils Relative % 88 %   Neutro Abs 9.8 (H) 1.7 - 7.7 K/uL   Lymphocytes Relative 4 %   Lymphs Abs 0.5 (L) 0.7 - 4.0 K/uL   Monocytes Relative 7 %   Monocytes Absolute 0.8 0.1 - 1.0 K/uL   Eosinophils Relative 0 %   Eosinophils Absolute 0.0 0.0 - 0.5 K/uL   Basophils Relative 0 %   Basophils Absolute 0.0 0.0 - 0.1 K/uL   Immature Granulocytes 1 %   Abs Immature Granulocytes 0.08 (H) 0.00 - 0.07 K/uL    Comment: Performed at [facility], [place] [address] [place], [place] [address]  Brain natriuretic peptide     Status: Abnormal   Collection Time: 01/31/19  5:16 PM  Result Value Ref Range   B Natriuretic Peptide 521.9 (H) 0.0 - 100.0 pg/mL    Comment: Performed at [facility], [place] [address], [place], [place] [address]  SARS Coronavirus 2 (CEPHEID - Performed in [place] hospital lab), Hosp Order     Status: None   Collection Time: 01/31/19  5:16 PM   Specimen: Nasopharyngeal Swab  Result Value Ref Range   SARS Coronavirus 2 NEGATIVE NEGATIVE    Comment: (NOTE) If result is NEGATIVE SARS-CoV-2 target nucleic acids are NOT DETECTED. The SARS-CoV-2 RNA is generally detectable in upper and  lower  respiratory specimens during the acute phase of infection. The  lowest  concentration of SARS-CoV-2 viral copies this assay can detect is 250  copies / mL. A negative result does not preclude SARS-CoV-2 infection  and should not be used as the sole basis for treatment or other  patient management decisions.  A negative result may occur with  improper specimen collection / handling, submission of specimen other  than nasopharyngeal swab, presence of viral mutation(s) within the  areas targeted by this assay, and inadequate number of viral copies  (<250 copies / mL). A negative result must be combined with clinical  observations, patient history, and epidemiological information. If result is POSITIVE SARS-CoV-2 target nucleic acids are DETECTED. The SARS-CoV-2 RNA is generally detectable in upper and lower  respiratory specimens dur ing the acute phase of infection.  Positive  results are indicative of active infection with SARS-CoV-2.  Clinical  correlation with patient history and other diagnostic information is  necessary to determine patient infection status.  Positive results do  not rule out bacterial infection or co-infection with other viruses. If result is PRESUMPTIVE POSTIVE SARS-CoV-2 nucleic acids MAY BE PRESENT.   A presumptive positive result was obtained on the submitted specimen  and confirmed on repeat testing.  While 2019 novel coronavirus  (SARS-CoV-2) nucleic acids may be present in the submitted sample  additional confirmatory testing may be necessary for epidemiological  and / or clinical management purposes  to differentiate between  SARS-CoV-2 and other Sarbecovirus currently known to infect humans.  If clinically indicated additional testing with an alternate test  methodology [phone]) is advised. The SARS-CoV-2 RNA is generally  detectable in upper and lower respiratory sp ecimens during the acute  phase of infection. The expected result is  Negative. Fact Sheet for Patients:  StrictlyIdeas.no Fact Sheet for Healthcare Providers: BankingDealers.co.za This test is not yet approved or cleared by the [place] FDA and has been authorized for detection and/or diagnosis of SARS-CoV-2 by FDA under an Emergency Use Authorization (EUA).  This EUA will remain in effect (meaning this test can be used) for the duration of the COVID-19 declaration under Section 564(b)(1) of the Act, 21 U.S.C. section 360bbb-3(b)(1), unless the authorization is terminated or revoked sooner. Performed at [facility], [place] [address], [place], [place] [address]    Ct Head Wo Contrast  Result Date: 01/31/2019 CLINICAL DATA:  Status post fall yesterday. EXAM: CT HEAD WITHOUT CONTRAST CT CERVICAL SPINE WITHOUT CONTRAST TECHNIQUE: Multidetector CT imaging of the head and cervical spine was performed following the standard protocol without intravenous contrast. Multiplanar CT image reconstructions of the cervical spine were also generated. COMPARISON:  August 05, 2018 FINDINGS: CT HEAD FINDINGS Brain: No evidence of acute infarction, hemorrhage, hydrocephalus, extra-axial collection or mass lesion/mass effect. There is chronic diffuse atrophy. Chronic bilateral periventricular white matter small vessel ischemic changes are noted. Old infarcts identified in the right basal ganglia and in the left cerebellum. Vascular: No hyperdense vessel is noted. Skull: Normal. Negative for fracture or focal lesion. Sinuses/Orbits: No acute finding. Other: None. CT CERVICAL SPINE FINDINGS Alignment: Normal. Skull base and vertebrae: No acute fracture. No primary bone lesion or focal pathologic process. Soft tissues and spinal canal: No prevertebral fluid or swelling. No visible canal hematoma. Disc levels: There are degenerative joint changes noted throughout the cervical spine with narrowed joint space and osteophyte  formation. Partial fusion of C3 and C4 is noted. Upper chest: There is a spiculated nodule in the right apex measuring 1.1 cm. Other: None. IMPRESSION: No focal  acute intracranial abnormality identified. No acute fracture or dislocation of cervical spine. Spiculated nodule in the right apex measuring 1.1 cm. Neoplasm is not excluded. Further evaluation with a chest CT on outpatient basis is recommended. Electronically Signed   By: Abelardo Diesel M.D.   On: 01/31/2019 15:00   Ct Cervical Spine Wo Contrast  Result Date: 01/31/2019 CLINICAL DATA:  Status post fall yesterday. EXAM: CT HEAD WITHOUT CONTRAST CT CERVICAL SPINE WITHOUT CONTRAST TECHNIQUE: Multidetector CT imaging of the head and cervical spine was performed following the standard protocol without intravenous contrast. Multiplanar CT image reconstructions of the cervical spine were also generated. COMPARISON:  August 05, 2018 FINDINGS: CT HEAD FINDINGS Brain: No evidence of acute infarction, hemorrhage, hydrocephalus, extra-axial collection or mass lesion/mass effect. There is chronic diffuse atrophy. Chronic bilateral periventricular white matter small vessel ischemic changes are noted. Old infarcts identified in the right basal ganglia and in the left cerebellum. Vascular: No hyperdense vessel is noted. Skull: Normal. Negative for fracture or focal lesion. Sinuses/Orbits: No acute finding. Other: None. CT CERVICAL SPINE FINDINGS Alignment: Normal. Skull base and vertebrae: No acute fracture. No primary bone lesion or focal pathologic process. Soft tissues and spinal canal: No prevertebral fluid or swelling. No visible canal hematoma. Disc levels: There are degenerative joint changes noted throughout the cervical spine with narrowed joint space and osteophyte formation. Partial fusion of C3 and C4 is noted. Upper chest: There is a spiculated nodule in the right apex measuring 1.1 cm. Other: None. IMPRESSION: No focal acute intracranial abnormality  identified. No acute fracture or dislocation of cervical spine. Spiculated nodule in the right apex measuring 1.1 cm. Neoplasm is not excluded. Further evaluation with a chest CT on outpatient basis is recommended. Electronically Signed   By: Abelardo Diesel M.D.   On: 01/31/2019 15:00   Dg Chest Port 1 View  Result Date: 01/31/2019 CLINICAL DATA:  Preoperative assessment for hip fracture. EXAM: PORTABLE CHEST 1 VIEW COMPARISON:  June 21, 2018 FINDINGS: There is a moderate pleural effusion on the left with a much smaller pleural effusion on the right. There is mild cardiomegaly with pulmonary venous hypertension. There is mild hazy opacity in the upper lobes which likely represents mild edema. No frank consolidation evident. No pneumothorax. No adenopathy. No bone lesions. IMPRESSION: Pulmonary vascular congestion with a likely degree of congestive heart failure. Pleural effusions bilaterally, larger on the left than on the right. No frank consolidation. Electronically Signed   By: Lowella Grip III M.D.   On: 01/31/2019 16:50   Dg Hips Bilat W Or Wo Pelvis 3-4 Views  Result Date: 01/31/2019 CLINICAL DATA:  Status post fall yesterday with left hip pain. EXAM: DG HIP (WITH OR WITHOUT PELVIS) 3-4V BILAT COMPARISON:  None. FINDINGS: There is displaced fracture of the left femoral neck. No other acute fracture or dislocation is identified. There is a right hip replacement without malalignment. IMPRESSION: Displaced fracture of the left femoral neck. Electronically Signed   By: Abelardo Diesel M.D.   On: 01/31/2019 15:06    Pending Labs Unresulted Labs (From admission, onward)    Start     Ordered   02/01/19 0849  Magnesium  Once,   STAT     02/01/19 0848   02/01/19 3976  Basic metabolic panel  Tomorrow morning,   R     01/31/19 2043   02/01/19 0500  CBC  Tomorrow morning,   R     01/31/19 2043  Vitals/Pain Today's Vitals   02/01/19 0500 02/01/19 0600 02/01/19 0700 02/01/19 0900   BP: (!) 154/79 137/65 133/68 113/62  Pulse: (!) 105 84 (!) 103 83  Resp: 20 20 17 17   Temp:      TempSrc:      SpO2: 93% 96% 91% 96%  PainSc:        Isolation Precautions No active isolations  Medications Medications  chlorhexidine (HIBICLENS) 4 % liquid 4 application (has no administration in time range)  povidone-iodine 10 % swab 2 application (has no administration in time range)  ceFAZolin (ANCEF) IVPB 2g/100 mL premix (has no administration in time range)  povidone-iodine 10 % swab 2 application (has no administration in time range)  tranexamic acid (CYKLOKAPRON) IVPB 1,000 mg (has no administration in time range)  morphine 2 MG/ML injection 0.5-1 mg (has no administration in time range)  ondansetron (ZOFRAN) injection 4 mg (has no administration in time range)  diltiazem (CARDIZEM CD) 24 hr capsule 120 mg (has no administration in time range)  donepezil (ARICEPT) tablet 5 mg (has no administration in time range)  polyethylene glycol (MIRALAX / GLYCOLAX) packet 17 g (has no administration in time range)  furosemide (LASIX) injection 20 mg (has no administration in time range)  feeding supplement (ENSURE PRE-SURGERY) liquid 296 mL (296 mLs Oral Given 02/01/19 0304)  furosemide (LASIX) injection 20 mg (20 mg Intravenous Given 02/01/19 0115)    Mobility non-ambulatory High fall risk   Focused Assessments Neuro Assessment Handoff:  Swallow screen pass? Yes          Neuro Assessment:   Neuro Checks:      Last Documented NIHSS Modified Score:   Has TPA been given? No If patient is a Neuro Trauma and patient is going to OR before floor call report to [place] nurse: [phone] or [phone]     R Recommendations: See Admitting Provider Note  Report given to:   Additional Notes:

## 2019-02-01 NOTE — Anesthesia Procedure Notes (Signed)
Procedure Name: MAC Date/Time: 02/01/2019 3:22 PM Performed by: Niel Hummer, CRNA Pre-anesthesia Checklist: Patient identified, Emergency Drugs available, Suction available and Patient being monitored Patient Re-evaluated:Patient Re-evaluated prior to induction Oxygen Delivery Method: Simple face mask

## 2019-02-01 NOTE — Anesthesia Procedure Notes (Addendum)
Spinal  Patient location during procedure: OR Start time: 02/01/2019 3:21 PM End time: 02/01/2019 3:29 PM Staffing Anesthesiologist: Duane Boston, MD Performed: anesthesiologist  Preanesthetic Checklist Completed: patient identified, surgical consent, pre-op evaluation, timeout performed, IV checked, risks and benefits discussed and monitors and equipment checked Spinal Block Patient position: left lateral decubitus Prep: DuraPrep Patient monitoring: cardiac monitor, continuous pulse ox and blood pressure Approach: midline Location: L2-3 Injection technique: single-shot Needle Needle type: Quincke  Needle gauge: 22 G Needle length: 9 cm Additional Notes Functioning IV was confirmed and monitors were applied. Sterile prep and drape, including hand hygiene and sterile gloves were used. The patient was positioned and the spine was prepped. The skin was anesthetized with lidocaine.  Free flow of clear CSF was obtained prior to injecting local anesthetic into the CSF.  The spinal needle aspirated freely following injection.  The needle was carefully withdrawn.  The patient tolerated the procedure well.

## 2019-02-02 ENCOUNTER — Encounter (HOSPITAL_COMMUNITY): Payer: Self-pay | Admitting: Orthopedic Surgery

## 2019-02-02 DIAGNOSIS — L8990 Pressure ulcer of unspecified site, unspecified stage: Secondary | ICD-10-CM | POA: Insufficient documentation

## 2019-02-02 DIAGNOSIS — G300 Alzheimer's disease with early onset: Secondary | ICD-10-CM

## 2019-02-02 LAB — BASIC METABOLIC PANEL
Anion gap: 13 (ref 5–15)
BUN: 20 mg/dL (ref 8–23)
CO2: 28 mmol/L (ref 22–32)
Calcium: 8.6 mg/dL — ABNORMAL LOW (ref 8.9–10.3)
Chloride: 92 mmol/L — ABNORMAL LOW (ref 98–111)
Creatinine, Ser: 1.03 mg/dL — ABNORMAL HIGH (ref 0.44–1.00)
GFR calc Af Amer: 54 mL/min — ABNORMAL LOW (ref >60)
GFR calc non Af Amer: 46 mL/min — ABNORMAL LOW (ref >60)
Glucose, Bld: 146 mg/dL — ABNORMAL HIGH (ref 70–99)
Potassium: 4 mmol/L (ref 3.5–5.1)
Sodium: 133 mmol/L — ABNORMAL LOW (ref 135–145)

## 2019-02-02 LAB — CBC WITH DIFFERENTIAL/PLATELET
Abs Immature Granulocytes: 0.03 10*3/uL (ref 0.00–0.07)
Basophils Absolute: 0 10*3/uL (ref 0.0–0.1)
Basophils Relative: 0 %
Eosinophils Absolute: 0 10*3/uL (ref 0.0–0.5)
Eosinophils Relative: 0 %
HCT: 37.7 % (ref 36.0–46.0)
Hemoglobin: 12 g/dL (ref 12.0–15.0)
Immature Granulocytes: 0 %
Lymphocytes Relative: 5 %
Lymphs Abs: 0.4 10*3/uL — ABNORMAL LOW (ref 0.7–4.0)
MCH: 31.8 pg (ref 26.0–34.0)
MCHC: 31.8 g/dL (ref 30.0–36.0)
MCV: 100 fL (ref 80.0–100.0)
Monocytes Absolute: 0.6 10*3/uL (ref 0.1–1.0)
Monocytes Relative: 7 %
Neutro Abs: 7.4 10*3/uL (ref 1.7–7.7)
Neutrophils Relative %: 88 %
Platelets: 162 10*3/uL (ref 150–400)
RBC: 3.77 MIL/uL — ABNORMAL LOW (ref 3.87–5.11)
RDW: 14.5 % (ref 11.5–15.5)
WBC: 8.5 10*3/uL (ref 4.0–10.5)
nRBC: 0 % (ref 0.0–0.2)

## 2019-02-02 LAB — MAGNESIUM: Magnesium: 1.9 mg/dL (ref 1.7–2.4)

## 2019-02-02 MED ORDER — HYDROCODONE-ACETAMINOPHEN 5-325 MG PO TABS: 1.0000 | ORAL_TABLET | Freq: Four times a day (QID) | ORAL | 0 refills | Status: DC | PRN

## 2019-02-02 MED ORDER — ASPIRIN 81 MG PO CHEW: 81.0000 mg | CHEWABLE_TABLET | Freq: Two times a day (BID) | ORAL | 1 refills | Status: DC

## 2019-02-02 MED ORDER — FUROSEMIDE 10 MG/ML IJ SOLN
20.0000 mg | Freq: Every day | INTRAMUSCULAR | Status: DC
  Administered 2019-02-03: 20 mg via INTRAVENOUS
  Filled 2019-02-02: qty 2

## 2019-02-02 MED ORDER — ENOXAPARIN SODIUM 30 MG/0.3ML ~~LOC~~ SOLN
30.0000 mg | SUBCUTANEOUS | Status: DC
  Administered 2019-02-03: 30 mg via SUBCUTANEOUS
  Filled 2019-02-02: qty 0.3

## 2019-02-02 NOTE — Progress Notes (Signed)
Attempts to wean patient to RA unsuccessful. On 3L [place]. Possible causes drowsiness, IV morphine @ 6am. Will continue to monitor and practice Incentive spirometer.

## 2019-02-02 NOTE — Progress Notes (Signed)
[facility]                                                                              Patient Demographics  Nichole [name], is a 85 y.o. female, DOB - Oct 26, 1922, BMW:[id number]  Admit date - 01/31/2019   Admitting Physician Vianne Bulls, MD  Outpatient Primary MD for the patient is Rankins, Bill Salinas, MD  Outpatient specialists:   LOS - 2  days   Medical records reviewed and are as summarized below:    Chief Complaint  Patient presents with   Fall   Hip Pain       Brief summary   HPI per Dr. Janece Canterbury Morrisonis a 85 y.o.femalewith medical history significant foratrial fibrillation no longer on anticoagulation, Alzheimer dementia, and hypertension, now presenting to emergency department with left hip pain after a fall yesterday. Patient does not remember falling but acknowledges pain in the left leg. She denies any chest pain,headache, or shortness of breath. EMS was called to the SNF today when the patient was complaining of left leg and hip pain. She was placed in a cervical collar and transported to the ED. Chest x-ray noted for spiculated nodule at the right apex, pulmonary vascular congestion, bilateral pleural effusions Hip x-ray showed displaced left femoral neck fracture.  COVID-19 test negative  Assessment & Plan    Principal Problem:   Closed left hip fracture, initial encounter ([place]) -Secondary to mechanical fall at skilled nursing facility -Orthopedics consulted, patient underwent left hip arthroplasty on 7/29, postop day #1 -Pain control, DVT prophylaxis per orthopedics  Active Problems:   Hyponatremia -Likely due to hypovolemic hyponatremia, sodium was 132 on admission, improving, 133 today  Acute on chronic diastolic CHF -Patient was noted to have peripheral edema, bibasilar crackles with elevated BNP on 7/29 -Patient was given a dose of IV Lasix then placed on Lasix 20 mg IV every 12 hours, strict I's and O's  and daily weights -Not on diuretics at home, may need to be discharged on low-dose oral diuretics as tolerated  Persistent atrial fibrillation -Restarted Cardizem, heart rate now controlled - [place] score 7, not an anticoagulation candidate due to history of recurrent falls  Dementia, Alzheimer's disease -Continue Aricept    HTN (hypertension) -BP currently stable    CKD (chronic kidney disease), stage III (HCC) -Baseline creatinine 0.9-1.0 -Creatinine currently at baseline, follow closely with diuresis    Lung nodule < 6cm  -Preop chest x-ray showed spiculated 1.1 cm nodule centrally noted on right apex Will need outpatient follow-up with pulmonology    Pressure injury of skin -Stage I sacral, management per nursing  Code Status: Full CODE STATUS DVT Prophylaxis: Lovenox Family Communication: Discussed in detail with the patient, all imaging results, lab results explained to the patient    Disposition Plan: Will likely need skilled nursing facility, social work consulted, start PT  Time Spent in minutes 35 minutes  Procedures:  Left hip hemiarthroplasty  Consultants:   Orthopedics  Antimicrobials:   Anti-infectives (From admission, onward)   Start     Dose/Rate Route Frequency Ordered Stop   02/01/19 2200  ceFAZolin (ANCEF) IVPB 2g/100  mL premix     2 g 200 mL/hr over 30 Minutes Intravenous Every 6 hours 02/01/19 2057 02/02/19 0425   02/01/19 0600  ceFAZolin (ANCEF) IVPB 2g/100 mL premix     2 g 200 mL/hr over 30 Minutes Intravenous On call to O.R. 02/01/19 0009 02/01/19 1603          Medications  Scheduled Meds:  diltiazem  120 mg Oral QPM   docusate sodium  100 mg Oral BID   donepezil  5 mg Oral Daily   enoxaparin (LOVENOX) injection  40 mg Subcutaneous Q24H   feeding supplement (ENSURE ENLIVE)  237 mL Oral BID BM   furosemide  20 mg Intravenous Q12H   senna  1 tablet Oral BID   Continuous Infusions: PRN Meds:.acetaminophen,  HYDROcodone-acetaminophen, HYDROcodone-acetaminophen, menthol-cetylpyridinium **OR** phenol, metoCLOPramide **OR** metoCLOPramide (REGLAN) injection, morphine injection, ondansetron **OR** ondansetron (ZOFRAN) IV, polyethylene glycol      Subjective:   Nichole [name] was seen and examined today.  No acute complaints.  No fevers or chills.  Pain controlled in the left hip. Patient denies dizziness, chest pain, shortness of breath, abdominal pain, N/V/D/C . No acute events overnight.    Objective:   Vitals:   02/01/19 2000 02/01/19 2028 02/01/19 2048 02/02/19 0025  BP:  129/73  (!) 107/58  Pulse:  93  87  Resp:    20  Temp:   97.6 F (36.4 C) 97.7 F (36.5 C)  TempSrc:   Oral Oral  SpO2: 96% 93%  97%  Weight:      Height:        Intake/Output Summary (Last 24 hours) at 02/02/2019 1533 Last data filed at 02/02/2019 0916 Gross per 24 hour  Intake 1696.67 ml  Output 910 ml  Net 786.67 ml     Wt Readings from Last 3 Encounters:  02/01/19 57.1 kg  08/11/18 59.6 kg  07/08/18 61.9 kg     Exam  General: Alert and oriented x 2, NAD  Eyes:   HEENT:  Atraumatic, normocephalic, normal oropharynx  Cardiovascular: S1 S2 auscultated, irregularly irregular  Respiratory: Clear to auscultation bilaterally, no wheezing, rales or rhonchi  Gastrointestinal: Soft, nontender, nondistended, + bowel sounds  Ext: no pedal edema bilaterally  Neuro: AAOx3, Cr N's II- XII. Strength 5/5 upper and lower extremities bilaterally, speech clear, sensations grossly intact  Musculoskeletal: No digital cyanosis, clubbing  Skin: Faint erythema over the left lower extremities, sacral decub stage I  Psych: Calm and cooperative   Data Reviewed:  I have personally reviewed following labs and imaging studies  Micro Results Recent Results (from the past 240 hour(s))  SARS Coronavirus 2 (CEPHEID - Performed in [place] hospital lab), Hosp Order     Status: None   Collection Time: 01/31/19   5:16 PM   Specimen: Nasopharyngeal Swab  Result Value Ref Range Status   SARS Coronavirus 2 NEGATIVE NEGATIVE Final    Comment: (NOTE) If result is NEGATIVE SARS-CoV-2 target nucleic acids are NOT DETECTED. The SARS-CoV-2 RNA is generally detectable in upper and lower  respiratory specimens during the acute phase of infection. The lowest  concentration of SARS-CoV-2 viral copies this assay can detect is 250  copies / mL. A negative result does not preclude SARS-CoV-2 infection  and should not be used as the sole basis for treatment or other  patient management decisions.  A negative result may occur with  improper specimen collection / handling, submission of specimen other  than nasopharyngeal swab, presence of viral  mutation(s) within the  areas targeted by this assay, and inadequate number of viral copies  (<250 copies / mL). A negative result must be combined with clinical  observations, patient history, and epidemiological information. If result is POSITIVE SARS-CoV-2 target nucleic acids are DETECTED. The SARS-CoV-2 RNA is generally detectable in upper and lower  respiratory specimens dur ing the acute phase of infection.  Positive  results are indicative of active infection with SARS-CoV-2.  Clinical  correlation with patient history and other diagnostic information is  necessary to determine patient infection status.  Positive results do  not rule out bacterial infection or co-infection with other viruses. If result is PRESUMPTIVE POSTIVE SARS-CoV-2 nucleic acids MAY BE PRESENT.   A presumptive positive result was obtained on the submitted specimen  and confirmed on repeat testing.  While 2019 novel coronavirus  (SARS-CoV-2) nucleic acids may be present in the submitted sample  additional confirmatory testing may be necessary for epidemiological  and / or clinical management purposes  to differentiate between  SARS-CoV-2 and other Sarbecovirus currently known to infect  humans.  If clinically indicated additional testing with an alternate test  methodology [phone]) is advised. The SARS-CoV-2 RNA is generally  detectable in upper and lower respiratory sp ecimens during the acute  phase of infection. The expected result is Negative. Fact Sheet for Patients:  StrictlyIdeas.no Fact Sheet for Healthcare Providers: BankingDealers.co.za This test is not yet approved or cleared by the [place] FDA and has been authorized for detection and/or diagnosis of SARS-CoV-2 by FDA under an Emergency Use Authorization (EUA).  This EUA will remain in effect (meaning this test can be used) for the duration of the COVID-19 declaration under Section 564(b)(1) of the Act, 21 U.S.C. section 360bbb-3(b)(1), unless the authorization is terminated or revoked sooner. Performed at [facility], [place] [address], [place], [place] [address]   MRSA PCR Screening     Status: None   Collection Time: 02/01/19 12:40 PM   Specimen: Nasopharyngeal  Result Value Ref Range Status   MRSA by PCR NEGATIVE NEGATIVE Final    Comment:        The GeneXpert MRSA Assay (FDA approved for NASAL specimens only), is one component of a comprehensive MRSA colonization surveillance program. It is not intended to diagnose MRSA infection nor to guide or monitor treatment for MRSA infections. Performed at [facility], [place] [address] [place], [place] [address]     Radiology Reports Ct Head Wo Contrast  Result Date: 01/31/2019 CLINICAL DATA:  Status post fall yesterday. EXAM: CT HEAD WITHOUT CONTRAST CT CERVICAL SPINE WITHOUT CONTRAST TECHNIQUE: Multidetector CT imaging of the head and cervical spine was performed following the standard protocol without intravenous contrast. Multiplanar CT image reconstructions of the cervical spine were also generated. COMPARISON:  August 05, 2018 FINDINGS: CT HEAD FINDINGS  Brain: No evidence of acute infarction, hemorrhage, hydrocephalus, extra-axial collection or mass lesion/mass effect. There is chronic diffuse atrophy. Chronic bilateral periventricular white matter small vessel ischemic changes are noted. Old infarcts identified in the right basal ganglia and in the left cerebellum. Vascular: No hyperdense vessel is noted. Skull: Normal. Negative for fracture or focal lesion. Sinuses/Orbits: No acute finding. Other: None. CT CERVICAL SPINE FINDINGS Alignment: Normal. Skull base and vertebrae: No acute fracture. No primary bone lesion or focal pathologic process. Soft tissues and spinal canal: No prevertebral fluid or swelling. No visible canal hematoma. Disc levels: There are degenerative joint changes noted throughout the cervical spine with  narrowed joint space and osteophyte formation. Partial fusion of C3 and C4 is noted. Upper chest: There is a spiculated nodule in the right apex measuring 1.1 cm. Other: None. IMPRESSION: No focal acute intracranial abnormality identified. No acute fracture or dislocation of cervical spine. Spiculated nodule in the right apex measuring 1.1 cm. Neoplasm is not excluded. Further evaluation with a chest CT on outpatient basis is recommended. Electronically Signed   By: Abelardo Diesel M.D.   On: 01/31/2019 15:00   Ct Cervical Spine Wo Contrast  Result Date: 01/31/2019 CLINICAL DATA:  Status post fall yesterday. EXAM: CT HEAD WITHOUT CONTRAST CT CERVICAL SPINE WITHOUT CONTRAST TECHNIQUE: Multidetector CT imaging of the head and cervical spine was performed following the standard protocol without intravenous contrast. Multiplanar CT image reconstructions of the cervical spine were also generated. COMPARISON:  August 05, 2018 FINDINGS: CT HEAD FINDINGS Brain: No evidence of acute infarction, hemorrhage, hydrocephalus, extra-axial collection or mass lesion/mass effect. There is chronic diffuse atrophy. Chronic bilateral periventricular white  matter small vessel ischemic changes are noted. Old infarcts identified in the right basal ganglia and in the left cerebellum. Vascular: No hyperdense vessel is noted. Skull: Normal. Negative for fracture or focal lesion. Sinuses/Orbits: No acute finding. Other: None. CT CERVICAL SPINE FINDINGS Alignment: Normal. Skull base and vertebrae: No acute fracture. No primary bone lesion or focal pathologic process. Soft tissues and spinal canal: No prevertebral fluid or swelling. No visible canal hematoma. Disc levels: There are degenerative joint changes noted throughout the cervical spine with narrowed joint space and osteophyte formation. Partial fusion of C3 and C4 is noted. Upper chest: There is a spiculated nodule in the right apex measuring 1.1 cm. Other: None. IMPRESSION: No focal acute intracranial abnormality identified. No acute fracture or dislocation of cervical spine. Spiculated nodule in the right apex measuring 1.1 cm. Neoplasm is not excluded. Further evaluation with a chest CT on outpatient basis is recommended. Electronically Signed   By: Abelardo Diesel M.D.   On: 01/31/2019 15:00   Pelvis Portable  Result Date: 02/01/2019 CLINICAL DATA:  Postop left anterior hip EXAM: PORTABLE PELVIS 1-2 VIEWS COMPARISON:  Bilateral hip radiographs dated 01/31/2019 FINDINGS: Bilateral hip hemiarthroplasties in satisfactory position. Mild subcutaneous gas on the left. Visualized bony pelvis appears intact. IMPRESSION: Bilateral hip hemiarthroplasties in satisfactory position. Electronically Signed   By: Julian Hy M.D.   On: 02/01/2019 19:52   Dg Chest Port 1 View  Result Date: 01/31/2019 CLINICAL DATA:  Preoperative assessment for hip fracture. EXAM: PORTABLE CHEST 1 VIEW COMPARISON:  June 21, 2018 FINDINGS: There is a moderate pleural effusion on the left with a much smaller pleural effusion on the right. There is mild cardiomegaly with pulmonary venous hypertension. There is mild hazy opacity in the  upper lobes which likely represents mild edema. No frank consolidation evident. No pneumothorax. No adenopathy. No bone lesions. IMPRESSION: Pulmonary vascular congestion with a likely degree of congestive heart failure. Pleural effusions bilaterally, larger on the left than on the right. No frank consolidation. Electronically Signed   By: Lowella Grip III M.D.   On: 01/31/2019 16:50   Dg C-arm 1-60 Min-no Report  Result Date: 02/01/2019 Fluoroscopy was utilized by the requesting physician.  No radiographic interpretation.   Dg Hip Operative Unilat With Pelvis Left  Result Date: 02/01/2019 CLINICAL DATA:  Left anterior hip arthroplasty EXAM: OPERATIVE LEFT HIP (WITH PELVIS IF PERFORMED) 2 VIEWS TECHNIQUE: Fluoroscopic spot image(s) were submitted for interpretation post-operatively. COMPARISON:  Bilateral hip radiographs  dated 01/31/2019 FINDINGS: Intraoperative fluoroscopic images demonstrate a left hip hemiarthroplasty in satisfactory position. IMPRESSION: Intraoperative fluoroscopic images during left hip hemiarthroplasty. Electronically Signed   By: Julian Hy M.D.   On: 02/01/2019 19:51   Dg Hips Bilat W Or Wo Pelvis 3-4 Views  Result Date: 01/31/2019 CLINICAL DATA:  Status post fall yesterday with left hip pain. EXAM: DG HIP (WITH OR WITHOUT PELVIS) 3-4V BILAT COMPARISON:  None. FINDINGS: There is displaced fracture of the left femoral neck. No other acute fracture or dislocation is identified. There is a right hip replacement without malalignment. IMPRESSION: Displaced fracture of the left femoral neck. Electronically Signed   By: Abelardo Diesel M.D.   On: 01/31/2019 15:06    Lab Data:  CBC: Recent Labs  Lab 01/31/19 1716 02/01/19 1016 02/02/19 0539  WBC 11.1* 11.3* 8.5  NEUTROABS 9.8*  --  7.4  HGB 12.6 12.8 12.0  HCT 40.1 40.0 37.7  MCV 99.8 100.5* 100.0  PLT 213 209 096   Basic Metabolic Panel: Recent Labs  Lab 01/31/19 1716 02/01/19 1016 02/02/19 0539  NA  132* 132* 133*  K 4.1 3.8 4.0  CL 93* 93* 92*  CO2 27 26 28   GLUCOSE 121* 94 146*  BUN 15 16 20   CREATININE 0.95 0.84 1.03*  CALCIUM 8.9 8.7* 8.6*  MG  --  1.9 1.9   GFR: Estimated Creatinine Clearance: 28.8 mL/min (A) (by C-G formula based on SCr of 1.03 mg/dL (H)). Liver Function Tests: Recent Labs  Lab 01/31/19 1716  AST 25  ALT 26  ALKPHOS 108  BILITOT 1.6*  PROT 6.9  ALBUMIN 3.5   No results for input(s): LIPASE, AMYLASE in the last 168 hours. No results for input(s): AMMONIA in the last 168 hours. Coagulation Profile: No results for input(s): INR, PROTIME in the last 168 hours. Cardiac Enzymes: No results for input(s): CKTOTAL, CKMB, CKMBINDEX, TROPONINI in the last 168 hours. BNP (last 3 results) No results for input(s): PROBNP in the last 8760 hours. HbA1C: No results for input(s): HGBA1C in the last 72 hours. CBG: No results for input(s): GLUCAP in the last 168 hours. Lipid Profile: No results for input(s): CHOL, HDL, LDLCALC, TRIG, CHOLHDL, LDLDIRECT in the last 72 hours. Thyroid Function Tests: No results for input(s): TSH, T4TOTAL, FREET4, T3FREE, THYROIDAB in the last 72 hours. Anemia Panel: No results for input(s): VITAMINB12, FOLATE, FERRITIN, TIBC, IRON, RETICCTPCT in the last 72 hours. Urine analysis:    Component Value Date/Time   COLORURINE YELLOW 01/31/2019 1524   APPEARANCEUR HAZY (A) 01/31/2019 1524   LABSPEC 1.019 01/31/2019 1524   PHURINE 6.0 01/31/2019 1524   GLUCOSEU 50 (A) 01/31/2019 1524   HGBUR MODERATE (A) 01/31/2019 1524   BILIRUBINUR NEGATIVE 01/31/2019 1524   KETONESUR 5 (A) 01/31/2019 1524   PROTEINUR 30 (A) 01/31/2019 1524   NITRITE NEGATIVE 01/31/2019 1524   LEUKOCYTESUR NEGATIVE 01/31/2019 1524     Yarisa Lynam M.D. [facility] 02/02/2019, 3:33 PM  Pager: [phone] Between 7am to 7pm - call Pager - [phone]  After 7pm go to [email or web address] - password [email or web address]  Call night coverage person covering after 7pm

## 2019-02-02 NOTE — Evaluation (Signed)
Physical Therapy Evaluation Patient Details Name: Nichole [name] MRN: [id number] DOB: [birth year] Today's Date: 02/02/2019   History of Present Illness  Pt is a 85 year old female s/p left hip hemiarthroplasty anterior approach due to fracture after fall at her facility.  Pt has a PMHx of R hip hemiarthroplasty, TIA, Alzheimers, breast cancer s/p mastectomy  Clinical Impression  Pt admitted with above diagnosis. Pt currently with functional limitations due to the deficits listed below (see PT Problem List).  Pt will benefit from skilled PT to increase their independence and safety with mobility to allow discharge to the venue listed below.  Pt very pleasant and motivated.  Pt assisted OOB to recliner POD #1.  Pt from memory care unit and will likely need SNF upon d/c unless facility can provide current level of care.     Follow Up Recommendations SNF    Equipment Recommendations  None recommended by PT    Recommendations for Other Services       Precautions / Restrictions Precautions Precautions: Fall Restrictions Weight Bearing Restrictions: No Other Position/Activity Restrictions: WBAT      Mobility  Bed Mobility Overal bed mobility: Needs Assistance Bed Mobility: Supine to Sit     Supine to sit: Min assist;HOB elevated     General bed mobility comments: verbal cues for self assist, assist for trunk upright  Transfers Overall transfer level: Needs assistance Equipment used: Rolling walker (2 wheeled) Transfers: Sit to/from [facility] Sit to Stand: Min assist;+2 safety/equipment Stand pivot transfers: Min assist;+2 safety/equipment       General transfer comment: verbal cues for safet technique, pt only wished to perform OOB to recliner today, declined ambulating, slow movement and cues for technique for small steps over to recliner  Ambulation/Gait                Stairs            Wheelchair Mobility    Modified Rankin (Stroke  Patients Only)       Balance Overall balance assessment: History of Falls                                           Pertinent Vitals/Pain Pain Assessment: Faces Faces Pain Scale: Hurts little more Pain Location: L hip with movement Pain Descriptors / Indicators: Aching;Sore;Guarding;Grimacing Pain Intervention(s): Monitored during session;Repositioned;Ice applied    Home Living Family/patient expects to be discharged to:: Assisted living               Home Equipment: Walker - 2 wheels Additional Comments: Pt is at [facility] memory care    Prior Function Level of Independence: Independent with assistive device(s)         Comments: No assistive device for ambulation per last admission (fall s/p R hip hemi); pt reports using a RW     Hand Dominance        Extremity/Trunk Assessment        Lower Extremity Assessment Lower Extremity Assessment: Generalized weakness;LLE deficits/detail LLE Deficits / Details: observed pt moving hip <50% AROM against gravity due to pain LLE: Unable to fully assess due to pain       Communication   Communication: HOH  Cognition Arousal/Alertness: Awake/alert Behavior During Therapy: WFL for tasks assessed/performed Overall Cognitive Status: History of cognitive impairments - at baseline  General Comments: possible dementia per chart notes, from memory care unit, pt tends to repeat herself, able to follow simple commands      General Comments      Exercises     Assessment/Plan    PT Assessment Patient needs continued PT services  PT Problem List Decreased strength;Decreased mobility;Decreased range of motion;Decreased balance;Decreased knowledge of use of DME;Pain;Decreased safety awareness;Decreased activity tolerance       PT Treatment Interventions DME instruction;Therapeutic exercise;Gait training;Balance training;Functional mobility  training;Therapeutic activities;Cognitive remediation;Patient/family education    PT Goals (Current goals can be found in the Care Plan section)  Acute Rehab PT Goals PT Goal Formulation: With patient Time For Goal Achievement: 02/09/19 Potential to Achieve Goals: Good    Frequency Min 2X/week   Barriers to discharge        Co-evaluation               AM-PAC PT "6 Clicks" Mobility  Outcome Measure Help needed turning from your back to your side while in a flat bed without using bedrails?: A Little Help needed moving from lying on your back to sitting on the side of a flat bed without using bedrails?: A Little Help needed moving to and from a bed to a chair (including a wheelchair)?: A Little Help needed standing up from a chair using your arms (e.g., wheelchair or bedside chair)?: A Little Help needed to walk in hospital room?: A Lot Help needed climbing 3-5 steps with a railing? : A Lot 6 Click Score: 16    End of Session Equipment Utilized During Treatment: Gait belt Activity Tolerance: Patient tolerated treatment well Patient left: in chair;with chair alarm set;with call bell/phone within reach;Other (comment)(telesitter) Nurse Communication: Mobility status PT Visit Diagnosis: Other abnormalities of gait and mobility (R26.89)    Time: 9741-6384 PT Time Calculation (min) (ACUTE ONLY): 17 min   Charges:   PT Evaluation $PT Eval Low Complexity: [place], PT, DPT Acute Rehabilitation Services Office: [phone] Pager: [phone]  Trena Platt 02/02/2019, 12:28 PM

## 2019-02-02 NOTE — Progress Notes (Signed)
    Subjective:  Patient reports pain as mild to moderate.  Denies N/V/CP/SOB.   Objective:   VITALS:   Vitals:   02/01/19 2000 02/01/19 2028 02/01/19 2048 02/02/19 0025  BP:  129/73  (!) 107/58  Pulse:  93  87  Resp:    20  Temp:   97.6 F (36.4 C) 97.7 F (36.5 C)  TempSrc:   Oral Oral  SpO2: 96% 93%  97%  Weight:      Height:        NAD, pleasantly confused Sensation intact distally Intact pulses distally Dorsiflexion/Plantar flexion intact Incision: dressing C/D/I Compartment soft   Lab Results  Component Value Date   WBC 8.5 02/02/2019   HGB 12.0 02/02/2019   HCT 37.7 02/02/2019   MCV 100.0 02/02/2019   PLT 162 02/02/2019   BMET    Component Value Date/Time   NA 133 (L) 02/02/2019 0539   K 4.0 02/02/2019 0539   CL 92 (L) 02/02/2019 0539   CO2 28 02/02/2019 0539   GLUCOSE 146 (H) 02/02/2019 0539   BUN 20 02/02/2019 0539   CREATININE 1.03 (H) 02/02/2019 0539   CALCIUM 8.6 (L) 02/02/2019 0539   GFRNONAA 46 (L) 02/02/2019 0539   GFRAA 54 (L) 02/02/2019 0539     Assessment/Plan: 1 Day Post-Op   Principal Problem:   Closed left hip fracture, initial encounter (HCC) Active Problems:   Hyponatremia   HTN (hypertension)   Alzheimer disease (HCC)   Persistent atrial fibrillation   CKD (chronic kidney disease), stage III (HCC)   Acute on chronic diastolic CHF (congestive heart failure) (HCC)   Lung nodule < 6cm on CT   Pressure injury of skin   WBAT with walker DVT ppx: Lovenox --> ASA, SCDs, TEDS PO pain control PT/OT Dispo: SNF placement   Hilton Cork Zahraa Bhargava 02/02/2019, 10:34 AM   Rod Can, MD Cell: [phone] [facility]  [place] [address], [place], [place] [address] Phone: [phone] [email or web address] Facebook  [employer]

## 2019-02-02 NOTE — Progress Notes (Signed)
Tele notified this RN of HR brady down to 47-48 in Afib rhythm. Vitals obtained, and stable. Notified Dr. Tana Coast, given orders to hold Cardizem and Lasix for this evening.

## 2019-02-02 NOTE — Progress Notes (Signed)
Call and updated daughter Margaretha Sheffield, on patient's status.

## 2019-02-03 DIAGNOSIS — F0390 Unspecified dementia without behavioral disturbance: Secondary | ICD-10-CM | POA: Diagnosis not present

## 2019-02-03 DIAGNOSIS — R0902 Hypoxemia: Secondary | ICD-10-CM | POA: Diagnosis not present

## 2019-02-03 DIAGNOSIS — G309 Alzheimer's disease, unspecified: Secondary | ICD-10-CM | POA: Diagnosis not present

## 2019-02-03 DIAGNOSIS — I130 Hypertensive heart and chronic kidney disease with heart failure and stage 1 through stage 4 chronic kidney disease, or unspecified chronic kidney disease: Secondary | ICD-10-CM | POA: Diagnosis not present

## 2019-02-03 DIAGNOSIS — Z96642 Presence of left artificial hip joint: Secondary | ICD-10-CM | POA: Diagnosis not present

## 2019-02-03 DIAGNOSIS — J9601 Acute respiratory failure with hypoxia: Secondary | ICD-10-CM | POA: Diagnosis not present

## 2019-02-03 DIAGNOSIS — W19XXXD Unspecified fall, subsequent encounter: Secondary | ICD-10-CM | POA: Diagnosis not present

## 2019-02-03 DIAGNOSIS — M6281 Muscle weakness (generalized): Secondary | ICD-10-CM | POA: Diagnosis not present

## 2019-02-03 DIAGNOSIS — S72002A Fracture of unspecified part of neck of left femur, initial encounter for closed fracture: Secondary | ICD-10-CM | POA: Diagnosis not present

## 2019-02-03 DIAGNOSIS — I959 Hypotension, unspecified: Secondary | ICD-10-CM | POA: Diagnosis not present

## 2019-02-03 DIAGNOSIS — I4819 Other persistent atrial fibrillation: Secondary | ICD-10-CM | POA: Diagnosis not present

## 2019-02-03 DIAGNOSIS — R54 Age-related physical debility: Secondary | ICD-10-CM | POA: Diagnosis not present

## 2019-02-03 DIAGNOSIS — F0280 Dementia in other diseases classified elsewhere without behavioral disturbance: Secondary | ICD-10-CM | POA: Diagnosis not present

## 2019-02-03 DIAGNOSIS — R2689 Other abnormalities of gait and mobility: Secondary | ICD-10-CM | POA: Diagnosis not present

## 2019-02-03 DIAGNOSIS — S72012A Unspecified intracapsular fracture of left femur, initial encounter for closed fracture: Secondary | ICD-10-CM | POA: Diagnosis not present

## 2019-02-03 DIAGNOSIS — Z20828 Contact with and (suspected) exposure to other viral communicable diseases: Secondary | ICD-10-CM | POA: Diagnosis not present

## 2019-02-03 DIAGNOSIS — R911 Solitary pulmonary nodule: Secondary | ICD-10-CM | POA: Diagnosis not present

## 2019-02-03 DIAGNOSIS — I5032 Chronic diastolic (congestive) heart failure: Secondary | ICD-10-CM | POA: Diagnosis not present

## 2019-02-03 DIAGNOSIS — Z7401 Bed confinement status: Secondary | ICD-10-CM | POA: Diagnosis not present

## 2019-02-03 DIAGNOSIS — I10 Essential (primary) hypertension: Secondary | ICD-10-CM | POA: Diagnosis not present

## 2019-02-03 DIAGNOSIS — M2550 Pain in unspecified joint: Secondary | ICD-10-CM | POA: Diagnosis not present

## 2019-02-03 DIAGNOSIS — I4821 Permanent atrial fibrillation: Secondary | ICD-10-CM | POA: Diagnosis not present

## 2019-02-03 DIAGNOSIS — G300 Alzheimer's disease with early onset: Secondary | ICD-10-CM | POA: Diagnosis not present

## 2019-02-03 DIAGNOSIS — S72002D Fracture of unspecified part of neck of left femur, subsequent encounter for closed fracture with routine healing: Secondary | ICD-10-CM | POA: Diagnosis not present

## 2019-02-03 DIAGNOSIS — I5033 Acute on chronic diastolic (congestive) heart failure: Secondary | ICD-10-CM | POA: Diagnosis not present

## 2019-02-03 DIAGNOSIS — G301 Alzheimer's disease with late onset: Secondary | ICD-10-CM | POA: Diagnosis not present

## 2019-02-03 DIAGNOSIS — Z471 Aftercare following joint replacement surgery: Secondary | ICD-10-CM | POA: Diagnosis not present

## 2019-02-03 DIAGNOSIS — W19XXXA Unspecified fall, initial encounter: Secondary | ICD-10-CM | POA: Diagnosis not present

## 2019-02-03 LAB — CBC
HCT: 35.8 % — ABNORMAL LOW (ref 36.0–46.0)
Hemoglobin: 11.1 g/dL — ABNORMAL LOW (ref 12.0–15.0)
MCH: 31.4 pg (ref 26.0–34.0)
MCHC: 31 g/dL (ref 30.0–36.0)
MCV: 101.4 fL — ABNORMAL HIGH (ref 80.0–100.0)
Platelets: 189 10*3/uL (ref 150–400)
RBC: 3.53 MIL/uL — ABNORMAL LOW (ref 3.87–5.11)
RDW: 14.6 % (ref 11.5–15.5)
WBC: 10.2 10*3/uL (ref 4.0–10.5)
nRBC: 0 % (ref 0.0–0.2)

## 2019-02-03 MED ORDER — MORPHINE SULFATE (PF) 2 MG/ML IV SOLN: 0.5000 mg | INTRAVENOUS | Status: DC | PRN

## 2019-02-03 MED ORDER — FUROSEMIDE 20 MG PO TABS: 20.0000 mg | ORAL_TABLET | Freq: Every day | ORAL | 11 refills | Status: AC

## 2019-02-03 MED ORDER — POLYETHYLENE GLYCOL 3350 17 G PO PACK: 17.0000 g | PACK | Freq: Every day | ORAL | 0 refills | Status: DC | PRN

## 2019-02-03 MED ORDER — DOCUSATE SODIUM 100 MG PO CAPS: 100.0000 mg | ORAL_CAPSULE | Freq: Two times a day (BID) | ORAL | 0 refills | Status: AC

## 2019-02-03 NOTE — Care Management Important Message (Signed)
Important Message  Patient Details IM Letter given to Rhea Pink SW to present to the Patient Name: Deshonna Trnka MRN: [id number] Date of Birth: 1923-06-15   Medicare Important Message Given:  Yes     Kerin Salen 02/03/2019, 11:32 AM

## 2019-02-03 NOTE — TOC Transition Note (Signed)
Transition of Care [facility]) - CM/SW Discharge Note   Patient Details  Name: Nichole [name] MRN: [id number] Date of Birth: 12/12/1922  Transition of Care [facility]) CM/SW Contact:  Wende Neighbors, LCSW Phone Number: 02/03/2019, 11:33 AM   Clinical Narrative:   Patient to dc to [facility] via [place]. Daughter aware of discharge. RN to please call [phone] (rm# [id number]) for report     Final next level of care: Skilled Nursing Facility Barriers to Discharge: No Barriers Identified   Patient Goals and CMS Choice Patient states their goals for this hospitalization and ongoing recovery are:: per daughter goal is to have patient go return back to [facility] CMS Medicare.gov Compare Post Acute Care list provided to:: Patient Represenative (must comment) Choice offered to / list presented to : Adult Children  Discharge Placement PASRR number recieved: 02/03/19 Existing PASRR number confirmed : 02/03/19          Patient chooses bed at: [facility] Patient to be transferred to facility by: ptar Name of family member notified: spoke with daughter elaine Patient and family notified of of transfer: 02/03/19  Discharge Plan and Services     Post Acute Care Choice: [place]                               Social Determinants of Health (SDOH) Interventions     Readmission Risk Interventions No flowsheet data found.

## 2019-02-03 NOTE — TOC Transition Note (Signed)
Transition of Care [facility]) - CM/SW Discharge Note   Patient Details  Name: Nichole [name] MRN: [id number] Date of Birth: 1923-02-21  Transition of Care [facility]) CM/SW Contact:  Wende Neighbors, LCSW Phone Number: 02/03/2019, 10:27 AM   Clinical Narrative:   CSW spoke with patients daughter Margaretha Sheffield via phone. Margaretha Sheffield stated that patient is from [place] living facility. Margaretha Sheffield stated she is agreeable with patient go to rehab stating that she has gone before in the past. CSW gave Margaretha Sheffield a list of facilities able to take patient for rehab. Daughter stated she will reach back out to [place] with decision     Final next level of care: Skilled Nursing Facility Barriers to Discharge: No Barriers Identified   Patient Goals and CMS Choice Patient states their goals for this hospitalization and ongoing recovery are:: per daughter goal is to have patient go return back to [facility] CMS Medicare.gov Compare Post Acute Care list provided to:: Patient Represenative (must comment) Choice offered to / list presented to : Adult Children  Discharge Placement                       Discharge Plan and Services     Post Acute Care Choice: Skilled Nursing Facility                               Social Determinants of Health (SDOH) Interventions     Readmission Risk Interventions No flowsheet data found.

## 2019-02-03 NOTE — Discharge Summary (Signed)
Physician Discharge Summary   Patient ID: Nichole [name] MRN: [id number] DOB/AGE: 83/17/1924 83 y.o.  Admit date: 01/31/2019 Discharge date: 02/03/2019  Primary Care Physician:  Aretta Nip, MD   Recommendations for Outpatient Follow-up:  1. Follow up with PCP in 1-2 weeks 2. Follow-up with orthopedics in 2 weeks 3. Orthopedics recommendations  DVT prophylaxis: Aspirin, SCDs orTEDS 4. Continue PT OT weightbearing as tolerated with walker, p.o. pain control, rehab 5. Lung nodule < 6cm  -Preop chest x-ray showed spiculated 1.1 cm nodule centrally noted on right apex Will need outpatient follow-up with pulmonology  6.  Patient placed on Lasix 20 mg oral daily, follow bmet for renal function, K  Home Health: Patient returning to skilled nursing facility Equipment/Devices:   Discharge Condition: stable  CODE STATUS: FULL  Diet recommendation: Heart healthy diet   Discharge Diagnoses:    . Closed left hip fracture, initial encounter ([place]) . Hyponatremia . HTN (hypertension) . Persistent atrial fibrillation . CKD (chronic kidney disease), stage III ([place]) . Alzheimer disease ([name]) . Acute on chronic diastolic CHF (congestive heart failure) ([place]) . Lung nodule < 6cm on CT Acute on chronic diastolic CHF  Consults: Orthopedics    Allergies:   Allergies  Allergen Reactions  . Tape Other (See Comments)    SKIN IS VERY THIN, SO IT WILL TEAR AND BRUISE EASILY!!     DISCHARGE MEDICATIONS: Allergies as of 02/03/2019      Reactions   Tape Other (See Comments)   SKIN IS VERY THIN, SO IT WILL TEAR AND BRUISE EASILY!!      Medication List    TAKE these medications   aspirin 81 MG chewable tablet Commonly known as: Aspirin Childrens Chew 1 tablet (81 mg total) by mouth 2 (two) times daily with a meal.   diltiazem 120 MG 24 hr capsule Commonly known as: CARDIZEM CD TAKE 1 CAPSULE BY MOUTH EVERY DAY What changed:   how much to take  when to take this    docusate sodium 100 MG capsule Commonly known as: COLACE Take 1 capsule (100 mg total) by mouth 2 (two) times daily.   donepezil 5 MG tablet Commonly known as: ARICEPT Take 5 mg by mouth daily.   furosemide 20 MG tablet Commonly known as: Lasix Take 1 tablet (20 mg total) by mouth daily.   HYDROcodone-acetaminophen 5-325 MG tablet Commonly known as: NORCO/VICODIN Take 1 tablet by mouth every 6 (six) hours as needed for moderate pain (pain score 4-6).   polyethylene glycol 17 g packet Commonly known as: MIRALAX / GLYCOLAX Take 17 g by mouth daily as needed for mild constipation.        Brief H and P: For complete details please refer to admission H and P, but in brief HPI per Dr. Janece Canterbury Morrisonis a 85 y.o.femalewith medical history significant foratrial fibrillation no longer on anticoagulation, Alzheimer dementia, and hypertension, now presenting to emergency department with left hip pain after a fall yesterday. Patient does not remember falling but acknowledges pain in the left leg. She denies any chest pain,headache, or shortness of breath. EMS was called to the SNF today when the patient was complaining of left leg and hip pain. She was placed in a cervical collar and transported to the ED. Chest x-ray noted for spiculated nodule at the right apex, pulmonary vascular congestion, bilateral pleural effusions Hip x-ray showed displaced left femoral neck fracture.  COVID-19 test negative  Hospital Course:   Closed left hip fracture, initial encounter (  [place]) -Secondary to mechanical fall at skilled nursing facility -Orthopedics consulted, patient underwent left hip arthroplasty on 7/29, postop day #2 -Pain control, DVT prophylaxis per orthopedics  -Continue PT OT, weightbearing as tolerated with walker, aspirin.  Outpatient follow-up with Dr. Lyla Glassing in 2 weeks     Hyponatremia -Likely due to hypervolemic hyponatremia, sodium was 132 on admission,  improving, 133.  Patient received Lasix IV during hospitalization.  Acute on chronic diastolic CHF, acute respiratory failure with hypoxia -Patient was noted to have peripheral edema, bibasilar crackles with elevated BNP on 7/29 -Patient was given a dose of IV Lasix then placed on Lasix 20 mg IV every 12 hours, strict I's and O's and daily weights -Not on diuretics at home, placed on Lasix 20 mg daily outpatient.  Follow bmet -Patient did require up to 3 L O2 via nasal cannula, improved with diuresis, wean off O2, currently sats 97% on room air  Persistent atrial fibrillation -Restarted Cardizem, heart rate now controlled - [place] score 7, not an anticoagulation candidate due to history of recurrent falls.  Continue aspirin  Dementia, Alzheimer's disease -Continue Aricept    HTN (hypertension) -BP currently stable    CKD (chronic kidney disease), stage III (HCC) -Baseline creatinine 0.9-1.0 -Creatinine currently at baseline    Lung nodule < 6cm  -Preop chest x-ray showed spiculated 1.1 cm nodule centrally noted on right apex Will need outpatient follow-up with pulmonology    Pressure injury of skin -Stage I sacral, management per nursing    Day of Discharge S: Doing well, no acute complaints, pain controlled.  BP 124/71 (BP Location: Left Arm)   Pulse 74   Temp 97.7 F (36.5 C) (Oral)   Resp 18   Ht 5\' 4"  (1.626 m)   Wt 57.1 kg   SpO2 97%   BMI 21.61 kg/m   Physical Exam: General: Alert and awake oriented x3 not in any acute distress. HEENT: anicteric sclera, pupils reactive to light and accommodation CVS: S1-S2 clear no murmur rubs or gallops Chest: Decreased breath sound at the bases Abdomen: soft nontender, nondistended, normal bowel sounds Extremities: no cyanosis, clubbing or edema noted bilaterally Neuro: No new deficits   The results of significant diagnostics from this hospitalization (including imaging, microbiology, ancillary and laboratory) are  listed below for reference.      Procedures/Studies:  Ct Head Wo Contrast  Result Date: 01/31/2019 CLINICAL DATA:  Status post fall yesterday. EXAM: CT HEAD WITHOUT CONTRAST CT CERVICAL SPINE WITHOUT CONTRAST TECHNIQUE: Multidetector CT imaging of the head and cervical spine was performed following the standard protocol without intravenous contrast. Multiplanar CT image reconstructions of the cervical spine were also generated. COMPARISON:  August 05, 2018 FINDINGS: CT HEAD FINDINGS Brain: No evidence of acute infarction, hemorrhage, hydrocephalus, extra-axial collection or mass lesion/mass effect. There is chronic diffuse atrophy. Chronic bilateral periventricular white matter small vessel ischemic changes are noted. Old infarcts identified in the right basal ganglia and in the left cerebellum. Vascular: No hyperdense vessel is noted. Skull: Normal. Negative for fracture or focal lesion. Sinuses/Orbits: No acute finding. Other: None. CT CERVICAL SPINE FINDINGS Alignment: Normal. Skull base and vertebrae: No acute fracture. No primary bone lesion or focal pathologic process. Soft tissues and spinal canal: No prevertebral fluid or swelling. No visible canal hematoma. Disc levels: There are degenerative joint changes noted throughout the cervical spine with narrowed joint space and osteophyte formation. Partial fusion of C3 and C4 is noted. Upper chest: There is a spiculated nodule in the  right apex measuring 1.1 cm. Other: None. IMPRESSION: No focal acute intracranial abnormality identified. No acute fracture or dislocation of cervical spine. Spiculated nodule in the right apex measuring 1.1 cm. Neoplasm is not excluded. Further evaluation with a chest CT on outpatient basis is recommended. Electronically Signed   By: Abelardo Diesel M.D.   On: 01/31/2019 15:00   Ct Cervical Spine Wo Contrast  Result Date: 01/31/2019 CLINICAL DATA:  Status post fall yesterday. EXAM: CT HEAD WITHOUT CONTRAST CT CERVICAL  SPINE WITHOUT CONTRAST TECHNIQUE: Multidetector CT imaging of the head and cervical spine was performed following the standard protocol without intravenous contrast. Multiplanar CT image reconstructions of the cervical spine were also generated. COMPARISON:  August 05, 2018 FINDINGS: CT HEAD FINDINGS Brain: No evidence of acute infarction, hemorrhage, hydrocephalus, extra-axial collection or mass lesion/mass effect. There is chronic diffuse atrophy. Chronic bilateral periventricular white matter small vessel ischemic changes are noted. Old infarcts identified in the right basal ganglia and in the left cerebellum. Vascular: No hyperdense vessel is noted. Skull: Normal. Negative for fracture or focal lesion. Sinuses/Orbits: No acute finding. Other: None. CT CERVICAL SPINE FINDINGS Alignment: Normal. Skull base and vertebrae: No acute fracture. No primary bone lesion or focal pathologic process. Soft tissues and spinal canal: No prevertebral fluid or swelling. No visible canal hematoma. Disc levels: There are degenerative joint changes noted throughout the cervical spine with narrowed joint space and osteophyte formation. Partial fusion of C3 and C4 is noted. Upper chest: There is a spiculated nodule in the right apex measuring 1.1 cm. Other: None. IMPRESSION: No focal acute intracranial abnormality identified. No acute fracture or dislocation of cervical spine. Spiculated nodule in the right apex measuring 1.1 cm. Neoplasm is not excluded. Further evaluation with a chest CT on outpatient basis is recommended. Electronically Signed   By: Abelardo Diesel M.D.   On: 01/31/2019 15:00   Pelvis Portable  Result Date: 02/01/2019 CLINICAL DATA:  Postop left anterior hip EXAM: PORTABLE PELVIS 1-2 VIEWS COMPARISON:  Bilateral hip radiographs dated 01/31/2019 FINDINGS: Bilateral hip hemiarthroplasties in satisfactory position. Mild subcutaneous gas on the left. Visualized bony pelvis appears intact. IMPRESSION: Bilateral hip  hemiarthroplasties in satisfactory position. Electronically Signed   By: Julian Hy M.D.   On: 02/01/2019 19:52   Dg Chest Port 1 View  Result Date: 01/31/2019 CLINICAL DATA:  Preoperative assessment for hip fracture. EXAM: PORTABLE CHEST 1 VIEW COMPARISON:  June 21, 2018 FINDINGS: There is a moderate pleural effusion on the left with a much smaller pleural effusion on the right. There is mild cardiomegaly with pulmonary venous hypertension. There is mild hazy opacity in the upper lobes which likely represents mild edema. No frank consolidation evident. No pneumothorax. No adenopathy. No bone lesions. IMPRESSION: Pulmonary vascular congestion with a likely degree of congestive heart failure. Pleural effusions bilaterally, larger on the left than on the right. No frank consolidation. Electronically Signed   By: Lowella Grip III M.D.   On: 01/31/2019 16:50   Dg C-arm 1-60 Min-no Report  Result Date: 02/01/2019 CLINICAL DATA:  Left anterior hip arthroplasty EXAM: OPERATIVE LEFT HIP (WITH PELVIS IF PERFORMED) 2 VIEWS TECHNIQUE: Fluoroscopic spot image(s) were submitted for interpretation post-operatively. COMPARISON:  Bilateral hip radiographs dated 01/31/2019 FINDINGS: Intraoperative fluoroscopic images demonstrate a left hip hemiarthroplasty in satisfactory position. IMPRESSION: Intraoperative fluoroscopic images during left hip hemiarthroplasty. Electronically Signed   By: Julian Hy M.D.   On: 02/01/2019 19:51   Dg Hip Operative Unilat With Pelvis Left  Result  Date: 02/01/2019 CLINICAL DATA:  Left anterior hip arthroplasty EXAM: OPERATIVE LEFT HIP (WITH PELVIS IF PERFORMED) 2 VIEWS TECHNIQUE: Fluoroscopic spot image(s) were submitted for interpretation post-operatively. COMPARISON:  Bilateral hip radiographs dated 01/31/2019 FINDINGS: Intraoperative fluoroscopic images demonstrate a left hip hemiarthroplasty in satisfactory position. IMPRESSION: Intraoperative fluoroscopic images  during left hip hemiarthroplasty. Electronically Signed   By: Julian Hy M.D.   On: 02/01/2019 19:51   Dg Hips Bilat W Or Wo Pelvis 3-4 Views  Result Date: 01/31/2019 CLINICAL DATA:  Status post fall yesterday with left hip pain. EXAM: DG HIP (WITH OR WITHOUT PELVIS) 3-4V BILAT COMPARISON:  None. FINDINGS: There is displaced fracture of the left femoral neck. No other acute fracture or dislocation is identified. There is a right hip replacement without malalignment. IMPRESSION: Displaced fracture of the left femoral neck. Electronically Signed   By: Abelardo Diesel M.D.   On: 01/31/2019 15:06      LAB RESULTS: Basic Metabolic Panel: Recent Labs  Lab 02/01/19 1016 02/02/19 0539  NA 132* 133*  K 3.8 4.0  CL 93* 92*  CO2 26 28  GLUCOSE 94 146*  BUN 16 20  CREATININE 0.84 1.03*  CALCIUM 8.7* 8.6*  MG 1.9 1.9   Liver Function Tests: Recent Labs  Lab 01/31/19 1716  AST 25  ALT 26  ALKPHOS 108  BILITOT 1.6*  PROT 6.9  ALBUMIN 3.5   No results for input(s): LIPASE, AMYLASE in the last 168 hours. No results for input(s): AMMONIA in the last 168 hours. CBC: Recent Labs  Lab 02/02/19 0539 02/03/19 0456  WBC 8.5 10.2  NEUTROABS 7.4  --   HGB 12.0 11.1*  HCT 37.7 35.8*  MCV 100.0 101.4*  PLT 162 189   Cardiac Enzymes: No results for input(s): CKTOTAL, CKMB, CKMBINDEX, TROPONINI in the last 168 hours. BNP: Invalid input(s): POCBNP CBG: No results for input(s): GLUCAP in the last 168 hours.    Disposition and Follow-up: Discharge Instructions    Diet - low sodium heart healthy   Complete by: As directed    Increase activity slowly   Complete by: As directed        DISPOSITION: [place] information for follow-up providers    [place], Aaron Edelman, MD. Schedule an appointment as soon as possible for a visit in 2 weeks.   Specialty: Orthopedic Surgery Why: For wound re-check Contact information: [address] [place] [address] [phone]            Contact information for after-discharge care    [facility] .   Service: Skilled Nursing Contact information: [address] [place] [phone]                   Time coordinating discharge:  40 minutes  Signed:   Estill Cotta M.D. [facility] 02/03/2019, 12:19 PM

## 2019-02-03 NOTE — Progress Notes (Signed)
[facility] here to transport patient to [facility]. All personal belongings returned to patient including clothes and dentures. Patients VSS at discharge and paperwork given to go with patient to SNF. Report already called to SNF. Pts daughter, Margaretha Sheffield, called to update that patient is being transported to [facility]. IV removed. No further questions from patient or pts daughter.

## 2019-02-03 NOTE — NC FL2 (Signed)
[place] LEVEL OF CARE SCREENING TOOL     IDENTIFICATION  Patient Name: Nichole [name] Birthdate: 11-Aug-1922 Sex: female Admission Date (Current Location): 01/31/2019  [facility] and [place] Number:  Herbalist and Address:  [facility],  [address], [place]      Provider Number: [id number]  Attending Physician Name and Address:  Mendel Corning, MD  Relative Name and Phone Number:  Nichole [name],[phone]    Current Level of Care: Hospital Recommended Level of Care: [place] Prior Approval Number:    Date Approved/Denied:   PASRR Number: [id number]  Discharge Plan: SNF    Current Diagnoses: Patient Active Problem List   Diagnosis Date Noted  . Pressure injury of skin 02/02/2019  . Closed left hip fracture, initial encounter ([place]) 01/31/2019  . Atrial fibrillation with RVR ([place]) 06/21/2018  . Anemia 06/21/2018  . Renal insufficiency 06/21/2018  . Bilateral lower extremity edema 06/21/2018  . Fall at home, initial encounter 05/12/2018  . Adjustment disorder with mixed disturbance of emotions and conduct   . Closed displaced fracture of right femoral neck ([place]) 05/10/2018  . Hip fracture ([place]) 05/09/2018  . Persistent atrial fibrillation 05/2018  . CKD (chronic kidney disease), stage III ([place]) 05/2018  . Acute on chronic diastolic CHF (congestive heart failure) ([place]) 05/2018  . Lung nodule < 6cm on CT 05/2018  . Alzheimer disease ([place]) 08/25/2017  . SIADH (syndrome of inappropriate ADH production) ([place])   . HTN (hypertension) 11/16/2015  . Hyponatremia 11/13/2015    Orientation RESPIRATION BLADDER Height & Weight     Self  O2(1 L) Incontinent, External catheter Weight: 125 lb 14.1 oz (57.1 kg) Height:  5\' 4"  (162.6 cm)  BEHAVIORAL SYMPTOMS/MOOD NEUROLOGICAL BOWEL NUTRITION STATUS      Continent Diet(regular)  AMBULATORY STATUS COMMUNICATION OF NEEDS Skin   Limited Assist Verbally                          Personal Care Assistance Level of Assistance  Bathing, Feeding, Dressing Bathing Assistance: Limited assistance Feeding assistance: Limited assistance Dressing Assistance: Limited assistance     Functional Limitations Info  Sight, Hearing, Speech Sight Info: Impaired(wears glasses) Hearing Info: Adequate Speech Info: Adequate    SPECIAL CARE FACTORS FREQUENCY  PT (By licensed PT), OT (By licensed OT)     PT Frequency: 5x wk OT Frequency: 5x wk            Contractures Contractures Info: Not present    Additional Factors Info  Code Status, Allergies Code Status Info: full code Allergies Info: tape           Current Medications (02/03/2019):  This is the current hospital active medication list Current Facility-Administered Medications  Medication Dose Route Frequency Provider Last Rate Last Dose  . acetaminophen (TYLENOL) tablet 325-650 mg  325-650 mg Oral Q6H PRN Swinteck, Aaron Edelman, MD      . diltiazem (CARDIZEM CD) 24 hr capsule 120 mg  120 mg Oral QPM Rod Can, MD   120 mg at 02/01/19 1906  . docusate sodium (COLACE) capsule 100 mg  100 mg Oral BID Rod Can, MD   100 mg at 02/03/19 8938  . donepezil (ARICEPT) tablet 5 mg  5 mg Oral Daily Rod Can, MD   5 mg at 02/03/19 1017  . enoxaparin (LOVENOX) injection 30 mg  30 mg Subcutaneous Q24H Rai, Ripudeep K, MD   30 mg at  02/03/19 0839  . feeding supplement (ENSURE ENLIVE) (ENSURE ENLIVE) liquid 237 mL  237 mL Oral BID BM Eugenie Filler, MD   237 mL at 02/03/19 0839  . furosemide (LASIX) injection 20 mg  20 mg Intravenous Daily Rai, Ripudeep K, MD   20 mg at 02/03/19 0839  . HYDROcodone-acetaminophen (NORCO/VICODIN) 5-325 MG per tablet 1-2 tablet  1-2 tablet Oral Q4H PRN Rai, Ripudeep K, MD      . menthol-cetylpyridinium (CEPACOL) lozenge 3 mg  1 lozenge Oral PRN Swinteck, Aaron Edelman, MD       Or  . phenol (CHLORASEPTIC) mouth spray 1 spray  1 spray Mouth/Throat PRN Swinteck, Aaron Edelman,  MD      . morphine 2 MG/ML injection 0.5-1 mg  0.5-1 mg Intravenous Q4H PRN Rai, Ripudeep K, MD      . ondansetron (ZOFRAN) tablet 4 mg  4 mg Oral Q6H PRN Swinteck, Aaron Edelman, MD       Or  . ondansetron (ZOFRAN) injection 4 mg  4 mg Intravenous Q6H PRN Swinteck, Aaron Edelman, MD      . polyethylene glycol (MIRALAX / GLYCOLAX) packet 17 g  17 g Oral Daily PRN Rod Can, MD   17 g at 02/02/19 1022  . senna (SENOKOT) tablet 8.6 mg  1 tablet Oral BID Rod Can, MD   8.6 mg at 02/03/19 9794     Discharge Medications: Please see discharge summary for a list of discharge medications.  Relevant Imaging Results:  Relevant Lab Results:   Additional Information SSN:[id number]  Nichole [name] Nichole Wynes, LCSW

## 2019-02-03 NOTE — Progress Notes (Signed)
Report called to [place] at [employer].

## 2019-02-07 DIAGNOSIS — I4821 Permanent atrial fibrillation: Secondary | ICD-10-CM | POA: Diagnosis not present

## 2019-02-07 DIAGNOSIS — R911 Solitary pulmonary nodule: Secondary | ICD-10-CM | POA: Diagnosis not present

## 2019-02-07 DIAGNOSIS — Z471 Aftercare following joint replacement surgery: Secondary | ICD-10-CM | POA: Diagnosis not present

## 2019-02-07 DIAGNOSIS — I5032 Chronic diastolic (congestive) heart failure: Secondary | ICD-10-CM | POA: Diagnosis not present

## 2019-02-07 DIAGNOSIS — G301 Alzheimer's disease with late onset: Secondary | ICD-10-CM | POA: Diagnosis not present

## 2019-02-07 NOTE — Op Note (Signed)
OPERATIVE REPORT  SURGEON: Rod Can, MD   ASSISTANT: Staff.  PREOPERATIVE DIAGNOSIS: Displaced Left femoral neck fracture.   POSTOPERATIVE DIAGNOSIS: Displaced Left femoral neck fracture.   PROCEDURE: Left hip hemiarthroplasty, anterior approach.   IMPLANTS:DePuy Tri Lock stem, size 5, std offset, with a -3 mm spacer and a 47 mm monopolar head ball.  ANESTHESIA:  General  ANTIBIOTICS: 2g ancef.  ESTIMATED BLOOD LOSS:-100 mL    DRAINS: None.  COMPLICATIONS: None   CONDITION: PACU - hemodynamically stable.   BRIEF CLINICAL NOTE: Nichole [name] is a 85 y.o. female with a displaced Left femoral neck fracture. The patient was admitted to the hospitalist service and underwent perioperative risk stratification and medical optimization. The risks, benefits, and alternatives to hemiarthroplasty were explained, and the patient elected to proceed.  PROCEDURE IN DETAIL: The patient was taken to the operating room and general anesthesia was induced on the hospital bed.  The patient was then positioned on the Hana table.  All bony prominences were well padded.  The hip was prepped and draped in the normal sterile surgical fashion.  A time-out was called verifying side and site of surgery. Antibiotics were given within 60 minutes of beginning the procedure.   The direct anterior approach to the hip was performed through the Hueter interval.  Lateral femoral circumflex vessels were treated with the Auqumantys. The anterior capsule was exposed and an inverted T capsulotomy was made.  Fracture hematoma was encountered and evacuated. The patient was found to have a comminuted Left subcapital femoral neck fracture.  I freshened the femoral neck cut with a saw.  I removed the femoral neck fragment.  A corkscrew was placed into the head and the head was removed.  This was passed to the back table and was measured.   Acetabular exposure was achieved.  I examined the articular cartilage which  was intact.  The labrum was intact. A 47 mm trial head was placed and found to have excellent fit.   I then gained femoral exposure taking care to protect the abductors and greater trochanter.  This was performed using standard external rotation, extension, and adduction.  The capsule was peeled off the inner aspect of the greater trochanter, taking care to preserve the short external rotators. A cookie cutter was used to enter the femoral canal, and then the femoral canal finder was used to confirm location.  I then sequentially broached up to a size 5.  Calcar planer was used on the femoral neck remnant.  I paced a std neck and a 36+ 0 head ball. The hip was reduced.  Leg lengths were checked fluoroscopically.  The hip was dislocated and trial components were removed.  I placed the real stem followed by the real spacer and head ball.  A single reduction maneuver was performed and the hip was reduced.  Fluoroscopy was used to confirm component position and leg lengths.  At 90 degrees of external rotation and extension, the hip was stable to an anterior directed force.   The wound was copiously irrigated with Irrisept solution and normal saline using pule lavage.  Marcaine solution was injected into the periarticular soft tissue.  The wound was closed in layers using #1 Vicryl and V-Loc for the fascia, 2-0 Vicryl for the subcutaneous fat, 2-0 Monocryl for the deep dermal layer, 3-0 running Monocryl subcuticular stitch and glue for the skin.  Once the glue was fully dried, an Aquacell Ag dressing was applied.  The patient was then awakened from  anesthesia and transported to the recovery room in stable condition.  Sponge, needle, and instrument counts were correct at the end of the case x2.  The patient tolerated the procedure well and there were no known complications.

## 2019-02-09 ENCOUNTER — Other Ambulatory Visit: Payer: Self-pay | Admitting: *Deleted

## 2019-02-09 NOTE — Patient Outreach (Signed)
Member assessed for potential [facility] Care Management needs as a benefit of  [place] Medicare.  Member is currently receiving rehab therapy at [facility].  Member discussed in weekly telephonic IDT meeting with facility staff, [facility] UM team, and writer.  Facility reports Mrs. Fruge resided in memory care in [place] at [facility] prior. However, unsure if member will return to ILF at this juncture.  Will continue to follow for progression, disposition plans, and for potential [facility] Care Management needs.   Will continue to collaborate with [facility] UM team and facility on member while at [facility] SNF.    Marthenia Rolling, MSN-Ed, RN,BSN [place] Acute Care Coordinator [phone] [facility]) [phone]  (Toll free office)

## 2019-02-17 DIAGNOSIS — I10 Essential (primary) hypertension: Secondary | ICD-10-CM | POA: Diagnosis not present

## 2019-02-17 DIAGNOSIS — G301 Alzheimer's disease with late onset: Secondary | ICD-10-CM | POA: Diagnosis not present

## 2019-02-23 ENCOUNTER — Other Ambulatory Visit: Payer: Self-pay | Admitting: *Deleted

## 2019-02-23 DIAGNOSIS — I10 Essential (primary) hypertension: Secondary | ICD-10-CM | POA: Diagnosis not present

## 2019-02-23 DIAGNOSIS — G309 Alzheimer's disease, unspecified: Secondary | ICD-10-CM | POA: Diagnosis not present

## 2019-02-23 DIAGNOSIS — I5032 Chronic diastolic (congestive) heart failure: Secondary | ICD-10-CM | POA: Diagnosis not present

## 2019-02-23 DIAGNOSIS — R54 Age-related physical debility: Secondary | ICD-10-CM | POA: Diagnosis not present

## 2019-02-23 DIAGNOSIS — S72002D Fracture of unspecified part of neck of left femur, subsequent encounter for closed fracture with routine healing: Secondary | ICD-10-CM | POA: Diagnosis not present

## 2019-02-23 NOTE — Patient Outreach (Signed)
Member assessed for potential [facility] Care Management needs as a benefit of  [place] Medicare.  Member is currently receiving rehab therapy at [facility].   Member discussed in weekly telephonic IDT meeting with facility staff, [facility] UM team, and writer.  Facility reports Nichole [name] will transition to ALF early next week. She has been having virtual assessments from different ALFs.  Will plan to sign off when member transitions to ALF if no further [place] Management needs.    Marthenia Rolling, MSN-Ed, RN,BSN [place] Acute Care Coordinator [phone] [facility]) [phone]  (Toll free office)

## 2019-02-27 DIAGNOSIS — I10 Essential (primary) hypertension: Secondary | ICD-10-CM | POA: Diagnosis not present

## 2019-02-27 DIAGNOSIS — G309 Alzheimer's disease, unspecified: Secondary | ICD-10-CM | POA: Diagnosis not present

## 2019-02-27 DIAGNOSIS — I5032 Chronic diastolic (congestive) heart failure: Secondary | ICD-10-CM | POA: Diagnosis not present

## 2019-02-27 DIAGNOSIS — S72002D Fracture of unspecified part of neck of left femur, subsequent encounter for closed fracture with routine healing: Secondary | ICD-10-CM | POA: Diagnosis not present

## 2019-02-27 DIAGNOSIS — Z96642 Presence of left artificial hip joint: Secondary | ICD-10-CM | POA: Diagnosis not present

## 2019-02-27 DIAGNOSIS — M6281 Muscle weakness (generalized): Secondary | ICD-10-CM | POA: Diagnosis not present

## 2019-03-02 ENCOUNTER — Other Ambulatory Visit: Payer: Self-pay | Admitting: *Deleted

## 2019-03-02 NOTE — Patient Outreach (Signed)
Member assessed for potential [facility] Care Management needs as a benefit of  [place] Medicare.  Confirmed with facility discharge planner at [facility] SNF that Mrs. Boakye transitioned to [facility].  Will sign off. No identifiable [facility] Care Management needs at this time.   Marthenia Rolling, MSN-Ed, RN,BSN [place] Acute Care Coordinator [phone] [facility]) [phone]  (Toll free office)

## 2019-03-05 DIAGNOSIS — I4891 Unspecified atrial fibrillation: Secondary | ICD-10-CM | POA: Diagnosis not present

## 2019-03-05 DIAGNOSIS — R911 Solitary pulmonary nodule: Secondary | ICD-10-CM | POA: Diagnosis not present

## 2019-03-05 DIAGNOSIS — L89623 Pressure ulcer of left heel, stage 3: Secondary | ICD-10-CM | POA: Diagnosis not present

## 2019-03-05 DIAGNOSIS — Z7901 Long term (current) use of anticoagulants: Secondary | ICD-10-CM | POA: Diagnosis not present

## 2019-03-05 DIAGNOSIS — N183 Chronic kidney disease, stage 3 (moderate): Secondary | ICD-10-CM | POA: Diagnosis not present

## 2019-03-05 DIAGNOSIS — I4819 Other persistent atrial fibrillation: Secondary | ICD-10-CM | POA: Diagnosis not present

## 2019-03-05 DIAGNOSIS — S72002D Fracture of unspecified part of neck of left femur, subsequent encounter for closed fracture with routine healing: Secondary | ICD-10-CM | POA: Diagnosis not present

## 2019-03-05 DIAGNOSIS — G309 Alzheimer's disease, unspecified: Secondary | ICD-10-CM | POA: Diagnosis not present

## 2019-03-05 DIAGNOSIS — I5033 Acute on chronic diastolic (congestive) heart failure: Secondary | ICD-10-CM | POA: Diagnosis not present

## 2019-03-05 DIAGNOSIS — Z853 Personal history of malignant neoplasm of breast: Secondary | ICD-10-CM | POA: Diagnosis not present

## 2019-03-05 DIAGNOSIS — F0280 Dementia in other diseases classified elsewhere without behavioral disturbance: Secondary | ICD-10-CM | POA: Diagnosis not present

## 2019-03-05 DIAGNOSIS — Z96643 Presence of artificial hip joint, bilateral: Secondary | ICD-10-CM | POA: Diagnosis not present

## 2019-03-05 DIAGNOSIS — I130 Hypertensive heart and chronic kidney disease with heart failure and stage 1 through stage 4 chronic kidney disease, or unspecified chronic kidney disease: Secondary | ICD-10-CM | POA: Diagnosis not present

## 2019-03-05 DIAGNOSIS — Z8673 Personal history of transient ischemic attack (TIA), and cerebral infarction without residual deficits: Secondary | ICD-10-CM | POA: Diagnosis not present

## 2019-03-05 DIAGNOSIS — Z9012 Acquired absence of left breast and nipple: Secondary | ICD-10-CM | POA: Diagnosis not present

## 2019-03-05 DIAGNOSIS — L89151 Pressure ulcer of sacral region, stage 1: Secondary | ICD-10-CM | POA: Diagnosis not present

## 2019-03-05 DIAGNOSIS — L89892 Pressure ulcer of other site, stage 2: Secondary | ICD-10-CM | POA: Diagnosis not present

## 2019-03-05 DIAGNOSIS — Z9181 History of falling: Secondary | ICD-10-CM | POA: Diagnosis not present

## 2019-03-08 DIAGNOSIS — I482 Chronic atrial fibrillation, unspecified: Secondary | ICD-10-CM | POA: Diagnosis not present

## 2019-03-08 DIAGNOSIS — R269 Unspecified abnormalities of gait and mobility: Secondary | ICD-10-CM | POA: Diagnosis not present

## 2019-03-08 DIAGNOSIS — R600 Localized edema: Secondary | ICD-10-CM | POA: Diagnosis not present

## 2019-03-08 DIAGNOSIS — I10 Essential (primary) hypertension: Secondary | ICD-10-CM | POA: Diagnosis not present

## 2019-03-08 DIAGNOSIS — M6281 Muscle weakness (generalized): Secondary | ICD-10-CM | POA: Diagnosis not present

## 2019-03-08 DIAGNOSIS — K5901 Slow transit constipation: Secondary | ICD-10-CM | POA: Diagnosis not present

## 2019-03-08 DIAGNOSIS — I5042 Chronic combined systolic (congestive) and diastolic (congestive) heart failure: Secondary | ICD-10-CM | POA: Diagnosis not present

## 2019-03-14 DIAGNOSIS — S72032D Displaced midcervical fracture of left femur, subsequent encounter for closed fracture with routine healing: Secondary | ICD-10-CM | POA: Diagnosis not present

## 2019-03-21 DIAGNOSIS — F320 Major depressive disorder, single episode, mild: Secondary | ICD-10-CM | POA: Diagnosis not present

## 2019-03-21 DIAGNOSIS — F0280 Dementia in other diseases classified elsewhere without behavioral disturbance: Secondary | ICD-10-CM | POA: Diagnosis not present

## 2019-03-21 DIAGNOSIS — G301 Alzheimer's disease with late onset: Secondary | ICD-10-CM | POA: Diagnosis not present

## 2019-03-22 DIAGNOSIS — R0602 Shortness of breath: Secondary | ICD-10-CM | POA: Diagnosis not present

## 2019-03-22 DIAGNOSIS — I5042 Chronic combined systolic (congestive) and diastolic (congestive) heart failure: Secondary | ICD-10-CM | POA: Diagnosis not present

## 2019-03-27 ENCOUNTER — Encounter: Payer: Self-pay | Admitting: Internal Medicine

## 2019-03-27 ENCOUNTER — Other Ambulatory Visit: Payer: Self-pay

## 2019-03-27 ENCOUNTER — Non-Acute Institutional Stay: Payer: Medicare Other | Admitting: Internal Medicine

## 2019-03-27 DIAGNOSIS — Z515 Encounter for palliative care: Secondary | ICD-10-CM | POA: Diagnosis not present

## 2019-03-27 NOTE — Progress Notes (Signed)
Sept 21st, 2020 [facility] Palliative Care Consult Note Telephone: [phone]  Fax: [phone]  PATIENT NAME: Nichole [name] DOB: 09/19/1922 MRN: [id number] [facility] [address] (move in date 03/01/2019) PRIMARY CARE PROVIDER:   Rankins, Bill Salinas, MD  REFERRING PROVIDER: [facility]) [place], Bill Salinas, MD [place] [address]  RESPONSIBLE PARTY: (dtr) [name], [phone]. Sindy Messing [phone]  1. Advance Care Planning: A. Directives: DNR on chart. Staff have discussed aspects of the MOST form with daughter Margaretha Sheffield. B. Goals of Care: To enjoy life; she likes to read.  2. Symptom management: Edema and dyspnea 2/2 CHF/underlying afib (controlled ventricular response; no anticoagulation d/t frequent falls): She was treated last week with increased diuretics (Lasix 40mg  qd x 2 weeks) for DOE, and with Levaquin for possible element of pneumonia. Currently has pitting LE edema toes to knees, with bilateral inspiratory crackles.  -Discussed with facility nursing consider increased diuresis. Attending to round on Wednesday.  2.Cognitive / Functional status: Patient is alert and oriented to self. Poor short-term memory. Able to name her family members; couldn't remember the name of facility but knew it was a retirement home. Able to self-transfer and ambulate with the walker independently. Poor safety awareness: sometimes forgets to use walker and almost lost her balance for me as she was making a 180 degree turn. Staff report at least 1 recent fall with bruising. Reports good appetite; staff reports appetite waxes and wanes. Weight stable since over these last 2 months at 120's. At a height 5'4 her BMI is 21 kg/m2. She is occasionally incontinent of urine but able to utilize bathroom okay. Denies pain. Staff report L heel wound.   3. Family Supports: Supportive daughter; I called her  with updates on today's visit. We discussed sections of the MOST form. I mailed her a blank MOST with accompanying patient educational information.   4. Follow up Palliative Care Visit. Within the next few months. Complete MOST form with daughter if she wishes.  I spent 60 minutes providing this consultation,  from 4pm to 5pm. More than 50% of the time in this consultation was spent coordinating communication.   HISTORY OF PRESENT ILLNESS:  Nichole [name] is a 85 y.o. female with h/o Alzheimer's disease, hyponatremia, HTN, persistent atrial fibrillation (no anticoagulation), CKD stage III, acute on chronic diastolic CHF, lung nodule < 6cm by chest CT 01/2019, and TIA.   Patient is s/p hospitalization 7/28-7/31/2020 for repair of L hip fracture. She was found to have a lung nodule R lung apex Palliative Care was asked to help address goals of care.   CODE STATUS: DNR  PPS: 40% HOSPICE ELIGIBILITY/DIAGNOSIS: no, as prognosis through to be greater than 6 months PAST MEDICAL HISTORY:  Past Medical History:  Diagnosis Date  . Alzheimer disease ([place]) 08/25/2017  . Breast CA ([place])   . HTN (hypertension)   . Persistent atrial fibrillation 05/2018  . TIA (transient ischemic attack) 2009    SOCIAL HX:  Social History   Tobacco Use  . Smoking status: Never Smoker  . Smokeless tobacco: Never Used  Substance Use Topics  . Alcohol use: No    ALLERGIES:  Allergies  Allergen Reactions  . Tape Other (See Comments)    SKIN IS VERY THIN, SO IT WILL TEAR AND BRUISE EASILY!!     PERTINENT MEDICATIONS:  Outpatient Encounter Medications as of 03/27/2019  Medication Sig  .  aspirin (ASPIRIN CHILDRENS) 81 MG chewable tablet Chew 1 tablet (81 mg total) by mouth 2 (two) times daily with a meal.  . diltiazem (CARDIZEM CD) 120 MG 24 hr capsule TAKE 1 CAPSULE BY MOUTH EVERY DAY (Patient taking differently: Take 120 mg by mouth every evening. )  . docusate sodium (COLACE) 100 MG capsule Take 1 capsule  (100 mg total) by mouth 2 (two) times daily.  Marland Kitchen donepezil (ARICEPT) 5 MG tablet Take 5 mg by mouth daily.   . furosemide (LASIX) 20 MG tablet Take 1 tablet (20 mg total) by mouth daily.  Marland Kitchen HYDROcodone-acetaminophen (NORCO/VICODIN) 5-325 MG tablet Take 1 tablet by mouth every 6 (six) hours as needed for moderate pain (pain score 4-6).  Marland Kitchen polyethylene glycol (MIRALAX / GLYCOLAX) 17 g packet Take 17 g by mouth daily as needed for mild constipation.   No facility-administered encounter medications on file as of 03/27/2019.     PHYSICAL EXAM:   HR 84 (irregular), BP 142/50, RR 16  General: NAD, frail appearing, slender Cardiovascular:irregular rate and rhythm no murmurs, rubs or gallops Pulmonary: scattered insp crackles throughout Abdomen: soft, nontender, + bowel sounds GU: no suprapubic tenderness Extremities: Bilateral LE pitting edema to knees; no joint deformities. Ankle to knee bilateral mild redness without warmth Skin: no rashesNeurological: Weakness but otherwise nonfocal  Julianne Handler, NP

## 2019-03-29 DIAGNOSIS — J189 Pneumonia, unspecified organism: Secondary | ICD-10-CM | POA: Diagnosis not present

## 2019-04-03 DIAGNOSIS — F0280 Dementia in other diseases classified elsewhere without behavioral disturbance: Secondary | ICD-10-CM | POA: Diagnosis not present

## 2019-04-03 DIAGNOSIS — G301 Alzheimer's disease with late onset: Secondary | ICD-10-CM | POA: Diagnosis not present

## 2019-04-03 DIAGNOSIS — F0632 Mood disorder due to known physiological condition with major depressive-like episode: Secondary | ICD-10-CM | POA: Diagnosis not present

## 2019-04-04 DIAGNOSIS — L89151 Pressure ulcer of sacral region, stage 1: Secondary | ICD-10-CM | POA: Diagnosis not present

## 2019-04-04 DIAGNOSIS — I4891 Unspecified atrial fibrillation: Secondary | ICD-10-CM | POA: Diagnosis not present

## 2019-04-04 DIAGNOSIS — I130 Hypertensive heart and chronic kidney disease with heart failure and stage 1 through stage 4 chronic kidney disease, or unspecified chronic kidney disease: Secondary | ICD-10-CM | POA: Diagnosis not present

## 2019-04-04 DIAGNOSIS — Z853 Personal history of malignant neoplasm of breast: Secondary | ICD-10-CM | POA: Diagnosis not present

## 2019-04-04 DIAGNOSIS — Z96643 Presence of artificial hip joint, bilateral: Secondary | ICD-10-CM | POA: Diagnosis not present

## 2019-04-04 DIAGNOSIS — L89623 Pressure ulcer of left heel, stage 3: Secondary | ICD-10-CM | POA: Diagnosis not present

## 2019-04-04 DIAGNOSIS — S72002D Fracture of unspecified part of neck of left femur, subsequent encounter for closed fracture with routine healing: Secondary | ICD-10-CM | POA: Diagnosis not present

## 2019-04-04 DIAGNOSIS — F0280 Dementia in other diseases classified elsewhere without behavioral disturbance: Secondary | ICD-10-CM | POA: Diagnosis not present

## 2019-04-04 DIAGNOSIS — G309 Alzheimer's disease, unspecified: Secondary | ICD-10-CM | POA: Diagnosis not present

## 2019-04-04 DIAGNOSIS — Z7901 Long term (current) use of anticoagulants: Secondary | ICD-10-CM | POA: Diagnosis not present

## 2019-04-04 DIAGNOSIS — I5033 Acute on chronic diastolic (congestive) heart failure: Secondary | ICD-10-CM | POA: Diagnosis not present

## 2019-04-04 DIAGNOSIS — Z9012 Acquired absence of left breast and nipple: Secondary | ICD-10-CM | POA: Diagnosis not present

## 2019-04-04 DIAGNOSIS — L89892 Pressure ulcer of other site, stage 2: Secondary | ICD-10-CM | POA: Diagnosis not present

## 2019-04-04 DIAGNOSIS — N183 Chronic kidney disease, stage 3 (moderate): Secondary | ICD-10-CM | POA: Diagnosis not present

## 2019-04-04 DIAGNOSIS — I4819 Other persistent atrial fibrillation: Secondary | ICD-10-CM | POA: Diagnosis not present

## 2019-04-04 DIAGNOSIS — R911 Solitary pulmonary nodule: Secondary | ICD-10-CM | POA: Diagnosis not present

## 2019-04-04 DIAGNOSIS — Z9181 History of falling: Secondary | ICD-10-CM | POA: Diagnosis not present

## 2019-04-04 DIAGNOSIS — Z8673 Personal history of transient ischemic attack (TIA), and cerebral infarction without residual deficits: Secondary | ICD-10-CM | POA: Diagnosis not present

## 2019-04-05 DIAGNOSIS — L89892 Pressure ulcer of other site, stage 2: Secondary | ICD-10-CM | POA: Diagnosis not present

## 2019-04-05 DIAGNOSIS — I4891 Unspecified atrial fibrillation: Secondary | ICD-10-CM | POA: Diagnosis not present

## 2019-04-05 DIAGNOSIS — S72002D Fracture of unspecified part of neck of left femur, subsequent encounter for closed fracture with routine healing: Secondary | ICD-10-CM | POA: Diagnosis not present

## 2019-04-05 DIAGNOSIS — G309 Alzheimer's disease, unspecified: Secondary | ICD-10-CM | POA: Diagnosis not present

## 2019-04-05 DIAGNOSIS — L89623 Pressure ulcer of left heel, stage 3: Secondary | ICD-10-CM | POA: Diagnosis not present

## 2019-04-05 DIAGNOSIS — L89151 Pressure ulcer of sacral region, stage 1: Secondary | ICD-10-CM | POA: Diagnosis not present

## 2019-04-12 DIAGNOSIS — I482 Chronic atrial fibrillation, unspecified: Secondary | ICD-10-CM | POA: Diagnosis not present

## 2019-04-16 ENCOUNTER — Encounter: Payer: Self-pay | Admitting: Internal Medicine

## 2019-04-16 NOTE — Progress Notes (Signed)
Oct 12th, 2020 [facility] Palliative Care Consult Note Telephone: [phone]  Fax: [phone]   PATIENT NAME: Nichole [name] DOB: Aug 28, 1922 MRN: [id number] [facility] [address] (move in date 03/01/2019) PRIMARY CARE PROVIDER:   Rankins, Bill Salinas, MD   REFERRING PROVIDER: [facility]) [place], Bill Salinas, MD [place] [address]  RESPONSIBLE PARTY: (dtr) [name], [phone]. Sindy Messing [phone]   1. Advance Care Planning: A. Directives: DNR  B. Goals of Care: To enjoy life; she likes to read.   2. Symptom management:  LE edema and dyspnea r/t severe TR and severe pulmonary HTN (Peak PA pressures 19mm Hg) by echo Dec 2019 ) and atrial fibrillation. Continues LE softly pitting edema to knees. Lungs clear. Started trial of Metoprolol 12.5mg  bid but staff report increased fatigue, LE weakness, and dyspnea over the WE, with HR 60 for me this am. SBP sitting 114. She was too weak to stand for orthostatic BP check. Continues low dose Lasix 20mg  qd with potassium supplementation, and Cardizem 120mg  for rate control. She is not on anticoagulants for afib d/t high frequency of falls.   -consider discontinuation Metoprolol  3.Cognitive / Functional status; Alzheimers dementia: FAST 6e. Patient is alert and oriented to self. Poor short-term memory. Able to name her family members; couldnt remember the name of facility (thought [facility]) but knew it was a retirement home. Very poor short-term memory. Staff report significant functional decline over the last few months. Decreased ability to self-transfer d/t dyspnea/fatigue/weakness. Fell few times over the weekend without injury. Poor safety awareness; forgets to call for help to transfer and to utilize walker. Now mostly incontinent of bowel and bladder. Weight about 1 month earlier was 121lbs, which is a loss of 10lbs over 6 months.  At height of 54 her BMI is 21kg/m2. Staff report oral intake aprox 50-75% of meals.    4. Family Supports: Supportive daughter; I will call her with updates on today's visit, but first Id like to check with our Hopsice physician if patient may meet criteria for hospice eligibility, and opinion of patients PCP. Will thereafter discuss goals of care with daughter.    5. Follow up: Possible Hospice evaluation.   I spent 60 minutes providing this consultation from 2pm to 3pm. More than 50% of the time in this consultation was spent coordinating communication.    HISTORY OF PRESENT ILLNESS:  Nichole [name] is a 85 y.o. female with h/o Alzheimers disease, hyponatremia, HTN, persistent atrial fibrillation (no anticoagulation), CKD stage III, acute on chronic diastolic CHF, lung nodule < 6cm by chest CT 01/2019, and TIA. Echocardiogram Dec 2019: cardiac valvular disease with mild aortic stenosis / mild aortic, mitral calcification / moderate mitral regurg, severe tricuspid regurg with pulmonary hypertension (PA peak pressure 61 mm HG)  Patient is s/p hospitalization 7/28-7/31/2020 for repair of L hip fracture. She was found to have an incidential lung nodule R lung apex This is a f/u Palliative Care visit from 03/27/2019.     CODE STATUS: DNR   PPS: very weak 40%  HOSPICE ELIGIBILITY/DIAGNOSIS: possibly yes/severe TR/Pulmonary HTNTBD  PAST MEDICAL HISTORY:  Past Medical History:  Diagnosis Date   Alzheimer disease ([place]) 08/25/2017   Breast CA ([place])    HTN (hypertension)    Persistent atrial fibrillation ([place]) 05/2018   TIA (transient ischemic attack) 2009    SOCIAL HX:  Social History   Tobacco Use  Smoking status: Never Smoker   Smokeless tobacco: Never Used  Substance Use Topics   Alcohol use: No    ALLERGIES:  Allergies  Allergen Reactions   Tape Other (See Comments)    SKIN IS VERY THIN, SO IT WILL TEAR AND BRUISE EASILY!!     PERTINENT MEDICATIONS:  Outpatient  Encounter Medications as of 04/17/2019  Medication Sig   diltiazem (CARDIZEM CD) 120 MG 24 hr capsule TAKE 1 CAPSULE BY MOUTH EVERY DAY (Patient taking differently: Take 120 mg by mouth every evening. )   donepezil (ARICEPT) 5 MG tablet Take 5 mg by mouth daily.    furosemide (LASIX) 20 MG tablet Take 1 tablet (20 mg total) by mouth daily.   HYDROcodone-acetaminophen (NORCO/VICODIN) 5-325 MG tablet Take 1 tablet by mouth every 6 (six) hours as needed for moderate pain.   metoprolol succinate (TOPROL-XL) 25 MG 24 hr tablet Take 12.5 mg by mouth 2 (two) times daily. 1/2 of a 25 mg tab, bid   mirtazapine (REMERON) 7.5 MG tablet Take 7.5 mg by mouth at bedtime.   polyethylene glycol (MIRALAX / GLYCOLAX) 17 g packet Take 17 g by mouth daily as needed for mild constipation.   potassium chloride (MICRO-K) 10 MEQ CR capsule Take 10 mEq by mouth daily.   docusate sodium (COLACE) 100 MG capsule Take 1 capsule (100 mg total) by mouth 2 (two) times daily. (Patient not taking: Reported on 04/16/2019)   No facility-administered encounter medications on file as of 04/17/2019.     PHYSICAL EXAM:   RR 26, HR 60 irregular, BP 99991111 systolic   General: frail appearing, slender, appears fatigued. Pleasantly conversant sitting up in her recliner. Unable to stand without assist, then quickly had to sit down again d/t weakness Cardiovascular:irregular rate and rhythm no murmurs, rubs or gallops Pulmonary: lung fields clear Abdomen: soft, nontender, + bowel sounds GU: no suprapubic tenderness Extremities: Bilateral LE pitting edema to knees; no joint deformities. R Ankle to knee mild redness without warmth. L LE compression wraps. Skin: no rashesNeurological: Weakness but otherwise nonfocal  Julianne Handler, NP

## 2019-04-17 ENCOUNTER — Other Ambulatory Visit: Payer: Self-pay

## 2019-04-17 ENCOUNTER — Non-Acute Institutional Stay: Payer: Medicare Other | Admitting: Internal Medicine

## 2019-04-17 DIAGNOSIS — Z515 Encounter for palliative care: Secondary | ICD-10-CM | POA: Diagnosis not present

## 2019-04-18 ENCOUNTER — Other Ambulatory Visit: Payer: Self-pay

## 2019-04-18 ENCOUNTER — Non-Acute Institutional Stay: Payer: Medicare Other | Admitting: Internal Medicine

## 2019-04-18 ENCOUNTER — Encounter: Payer: Self-pay | Admitting: Internal Medicine

## 2019-04-18 DIAGNOSIS — F320 Major depressive disorder, single episode, mild: Secondary | ICD-10-CM | POA: Diagnosis not present

## 2019-04-18 DIAGNOSIS — Z515 Encounter for palliative care: Secondary | ICD-10-CM

## 2019-04-18 DIAGNOSIS — G301 Alzheimer's disease with late onset: Secondary | ICD-10-CM | POA: Diagnosis not present

## 2019-04-18 DIAGNOSIS — F0280 Dementia in other diseases classified elsewhere without behavioral disturbance: Secondary | ICD-10-CM | POA: Diagnosis not present

## 2019-04-18 NOTE — Progress Notes (Signed)
Oct 13th, 2020 [facility] Palliative Care Consult Note Telephone: [phone]  Fax: [phone]   PATIENT NAME: Nichole [name] DOB: 03-May-1923 MRN: [id number] [place] [address] (move in date 03/01/2019) PRIMARY CARE PROVIDER:   Rankins, Bill Salinas, MD   REFERRING PROVIDER: [facility] [place], Bill Salinas, MD [place] [address]   RESPONSIBLE PARTY: (dtr) [name] [facility], [phone]. Sindy Messing [phone]   1.Advance Care Planning: A. Goals of Care: Family goals of care discussion with daughter Margaretha Sheffield. We discussed patient's progressive decline over the last 2 months, with increased somnolence, frequent falls, and weakness. In the setting of patients age, underlying medical condition (severe Pulmonary HTN, severe TR, moderate mitral regurg, and level of dementia, Margaretha Sheffield wishes goal of comfort care. Margaretha Sheffield does not wish for an aggressive work up or treatment. She wishes to avoid hospitalizations. We discussed that patient meets criteria for Hospice services. Margaretha Sheffield wishes to pursue referral  A. Directives: DNR   2. Follow up: Hospice evaluation.   I spent 60 minutes providing this consultation from 12pm to 1pm. More than 50% of the time in this consultation was spent coordinating communication.    HISTORY OF PRESENT ILLNESS:  Nichole [name] is a 85 y.o. female with h/o Alzheimer's disease, hyponatremia, HTN, persistent atrial fibrillation (no anticoagulation), CKD stage III, acute on chronic diastolic CHF, lung nodule < 6cm by chest CT 01/2019, and TIA. Echocardiogram Dec 2019: cardiac valvular disease with mild aortic stenosis / mild aortic, mitral calcification / moderate mitral regurg, severe tricuspid regurg with pulmonary hypertension (PA peak pressure 61 mm HG)  Patient is s/p hospitalization 7/28-7/31/2020 for repair of L hip fracture. She was found to have an incidential lung nodule  R lung apex This is a f/u Palliative Care visit from 04/17/2019.     CODE STATUS: DNR   PPS:  30%   HOSPICE ELIGIBILITY/DIAGNOSIS: possibly yes/severe TR/Pulmonary HTN  PAST MEDICAL HISTORY:  Past Medical History:  Diagnosis Date  . Alzheimer disease ([place]) 08/25/2017  . Breast CA ([place])   . HTN (hypertension)   . Persistent atrial fibrillation ([place]) 05/2018  . TIA (transient ischemic attack) 2009    SOCIAL HX:  Social History   Tobacco Use  . Smoking status: Never Smoker  . Smokeless tobacco: Never Used  Substance Use Topics  . Alcohol use: No    ALLERGIES:  Allergies  Allergen Reactions  . Tape Other (See Comments)    SKIN IS VERY THIN, SO IT WILL TEAR AND BRUISE EASILY!!     PERTINENT MEDICATIONS:  Outpatient Encounter Medications as of 04/18/2019  Medication Sig  . diltiazem (CARDIZEM CD) 120 MG 24 hr capsule TAKE 1 CAPSULE BY MOUTH EVERY DAY (Patient taking differently: Take 120 mg by mouth every evening. )  . docusate sodium (COLACE) 100 MG capsule Take 1 capsule (100 mg total) by mouth 2 (two) times daily. (Patient not taking: Reported on 04/16/2019)  . donepezil (ARICEPT) 5 MG tablet Take 5 mg by mouth daily.   . furosemide (LASIX) 20 MG tablet Take 1 tablet (20 mg total) by mouth daily.  Marland Kitchen HYDROcodone-acetaminophen (NORCO/VICODIN) 5-325 MG tablet Take 1 tablet by mouth every 6 (six) hours as needed for moderate pain.  . metoprolol succinate (TOPROL-XL) 25 MG 24 hr tablet Take 12.5 mg by mouth 2 (two) times daily. 1/2 of a 25 mg tab, bid  . mirtazapine (REMERON) 7.5 MG tablet Take 7.5 mg  by mouth at bedtime.  . polyethylene glycol (MIRALAX / GLYCOLAX) 17 g packet Take 17 g by mouth daily as needed for mild constipation.  . potassium chloride (MICRO-K) 10 MEQ CR capsule Take 10 mEq by mouth daily.   No facility-administered encounter medications on file as of 04/18/2019.     PHYSICAL EXAM:   PE deferred d/t telemedicine/telephonic nature of visit. Goals of care  discussion with daughter  Julianne Handler, NP

## 2019-04-19 ENCOUNTER — Telehealth: Payer: Self-pay | Admitting: Internal Medicine

## 2019-04-19 DIAGNOSIS — I499 Cardiac arrhythmia, unspecified: Secondary | ICD-10-CM | POA: Diagnosis not present

## 2019-04-19 DIAGNOSIS — R404 Transient alteration of awareness: Secondary | ICD-10-CM | POA: Diagnosis not present

## 2019-05-07 DIAGNOSIS — 419620001 Death: Secondary | SNOMED CT | POA: Diagnosis not present

## 2019-05-07 NOTE — Telephone Encounter (Signed)
10:30am  TC to PCG daughter [name]  [phone].  to inform her that patient had expired this am at [facility]; pronounced 10:17am by EMS. Patient was a DNR.   Daughter requested call back from facility assistant director at 11am; I called and coordinated this.   Violeta Gelinas NP-C [phone]

## 2019-05-07 DEATH — deceased

## 2020-01-19 IMAGING — CT CT HEAD W/O CM
4 series · 17 of 47 positions shown, 19 images · non-contrast
Comparison: None.

CLINICAL DATA: No onset atrial fibrillation.  Worsening dementia.

EXAM:
CT HEAD WITHOUT CONTRAST
TECHNIQUE: Contiguous axial images were obtained from the base of the skull
through the vertex without intravenous contrast.

[Series 3: head without · axial · non-contrast · 0.44mm/px · z∈[-74,+46]mm · 7 of 34 slices shown, 9 images]
[im 5/34  brain]
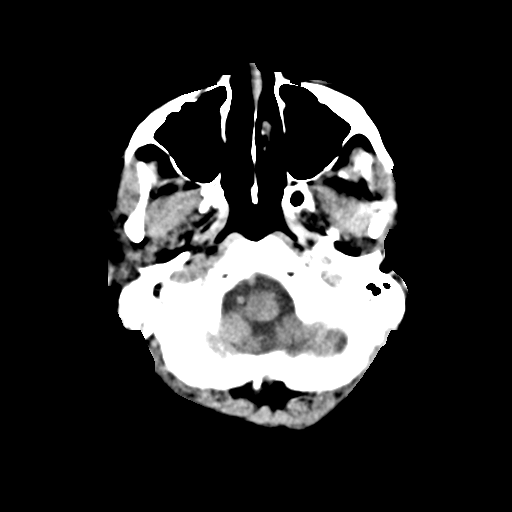
[im 5/34  bone]
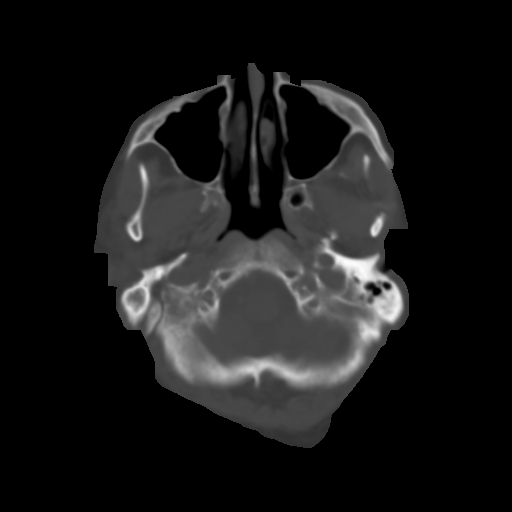
[im 9/34  brain]
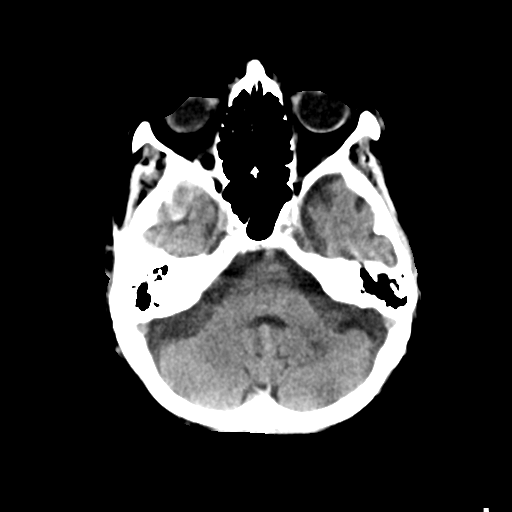
[im 13/34  brain]
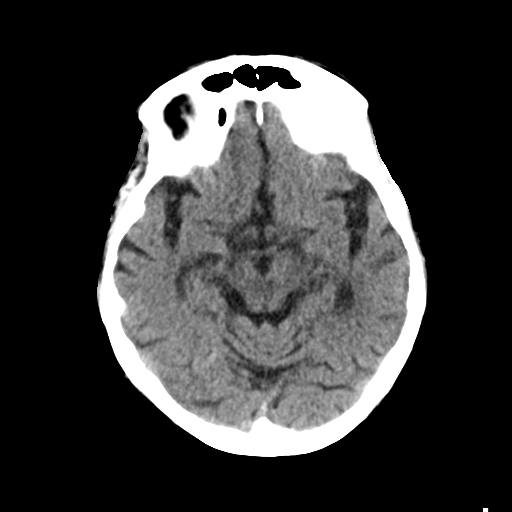
[im 17/34  brain]
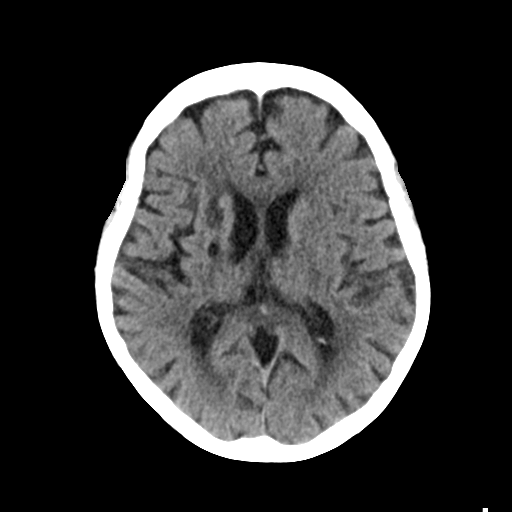
[im 21/34  brain]
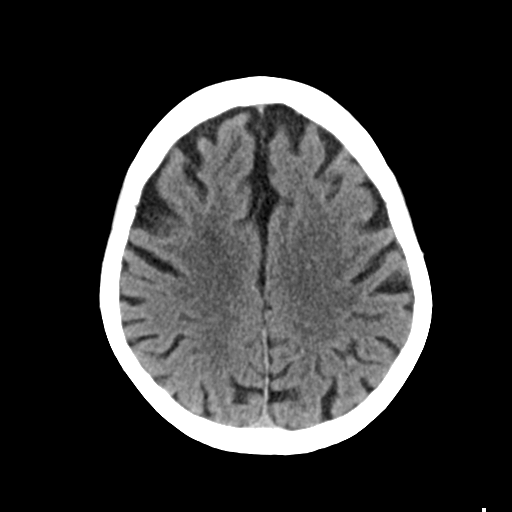
[im 21/34  bone]
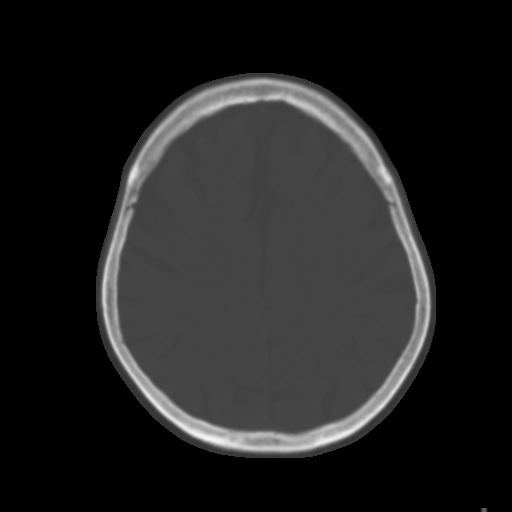
[im 25/34  brain]
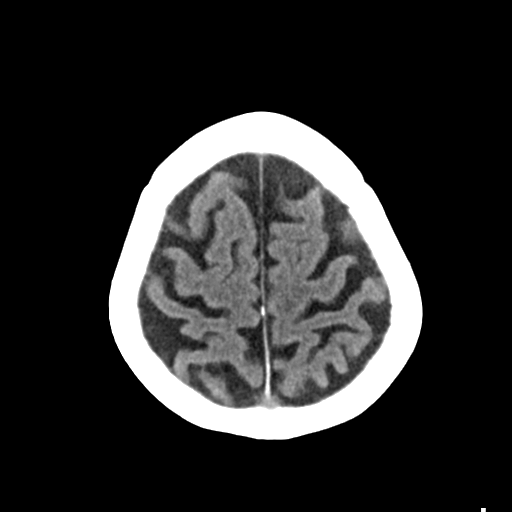
[im 29/34  brain]
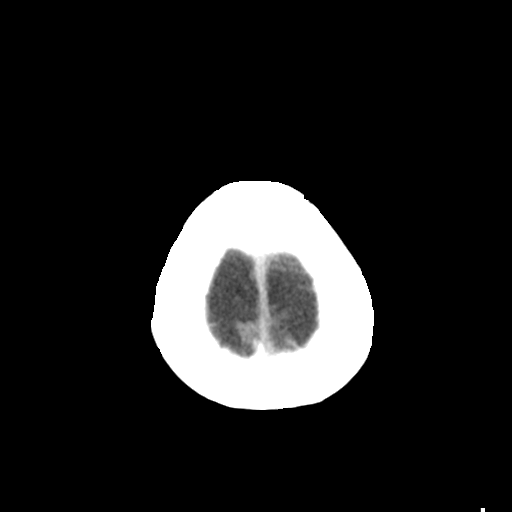

[Series 4: head bone · axial · 0.44mm/px · z∈[-78,-22]mm · 4 of 83 slices shown]
[im 9/83  bone]
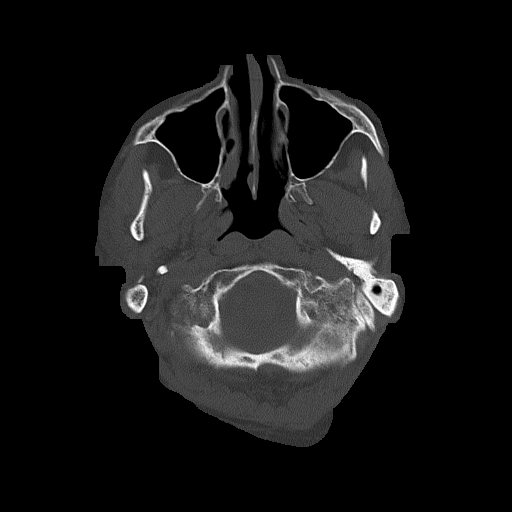
[im 17/83  bone]
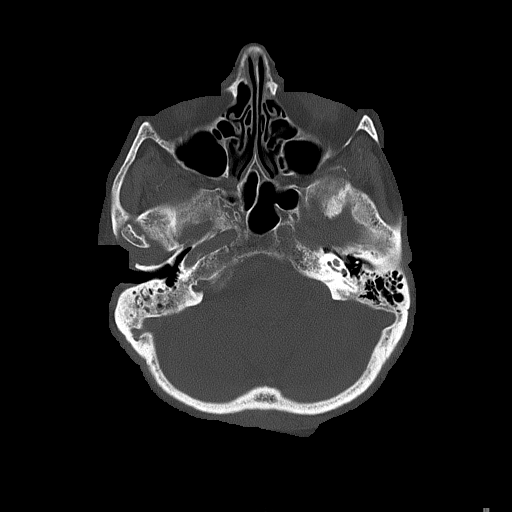
[im 25/83  bone]
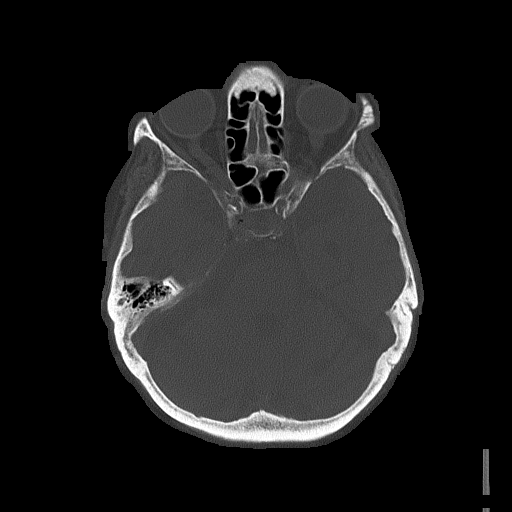
[im 37/83  bone]
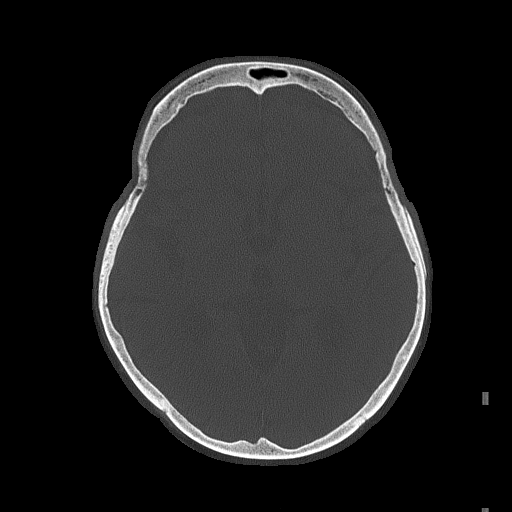

[Series 5: head without cor · coronal · non-contrast · 0.32mm/px · 3 of 66 slices shown]
[im 22/66  brain]
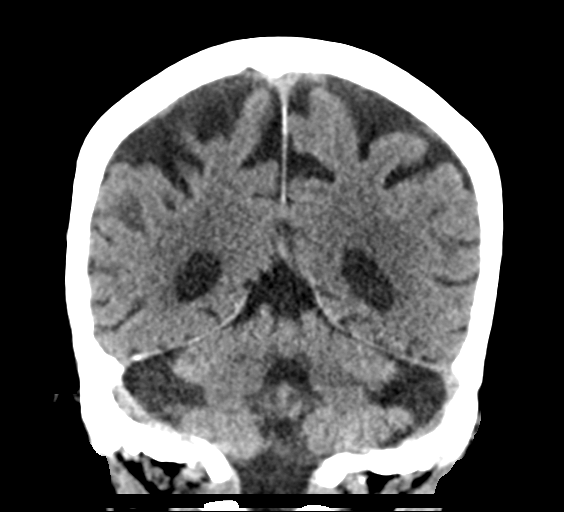
[im 29/66  brain]
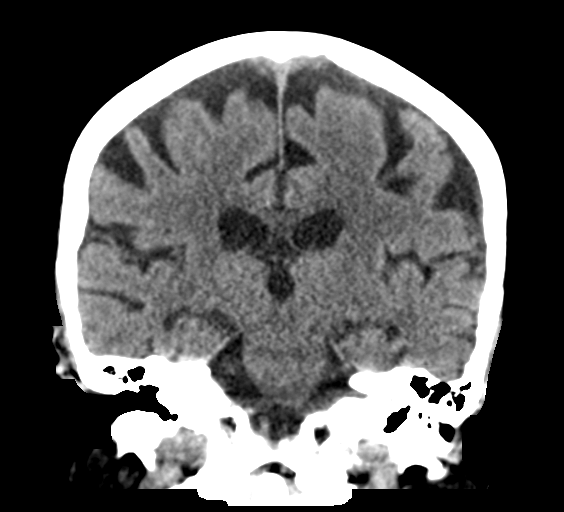
[im 37/66  brain]
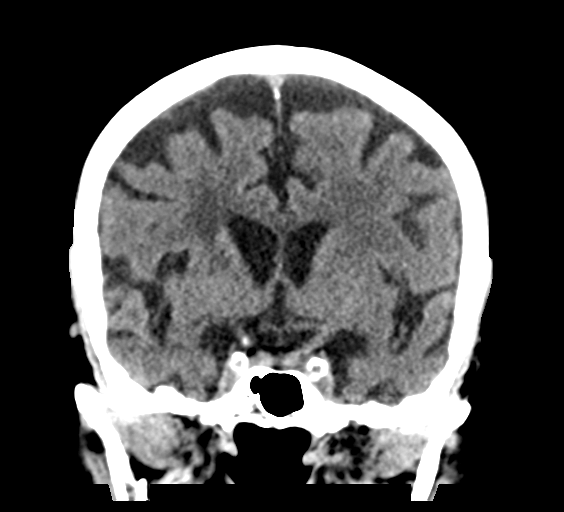

[Series 6: head without sag · sagittal · non-contrast · 0.34mm/px · 3 of 61 slices shown]
[im 21/61  brain]
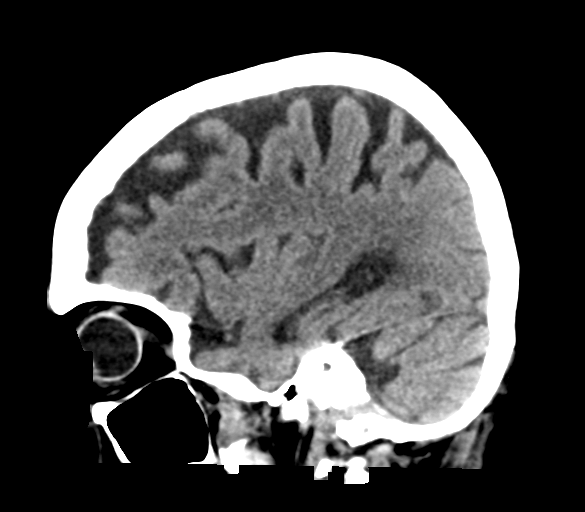
[im 31/61  brain]
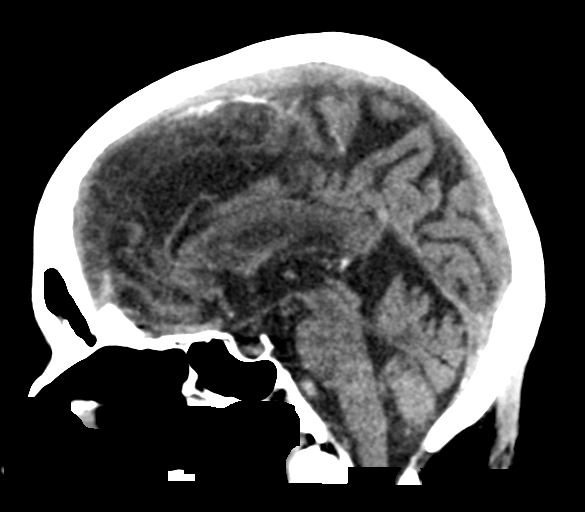
[im 41/61  brain]
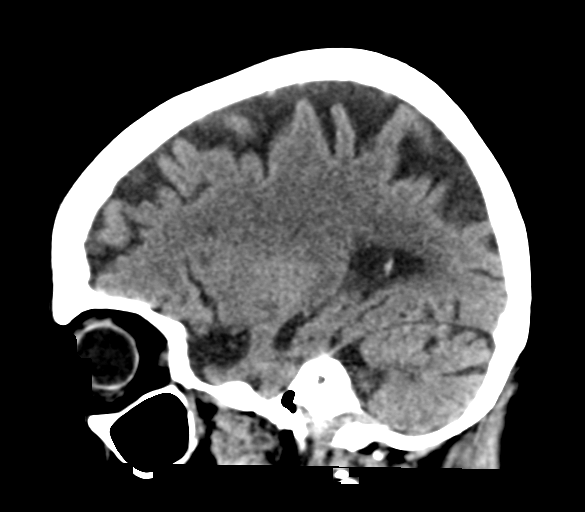

[17 of 47 positions shown; findings below may reference images not displayed]

FINDINGS: Brain: Age related cerebral atrophy, ventriculomegaly and
periventricular white matter disease. There are also remote lacunar
type basal ganglia and left cerebellar infarcts. No extra-axial
fluid collections are identified. No CT findings for acute
hemispheric infarction or intracranial hemorrhage. No mass lesions.
The brainstem and cerebellum are normal.

Vascular: Vascular calcifications without obvious aneurysm or
hyperdense vessels.

Skull: No acute bony findings or worrisome bone lesions.

Sinuses/Orbits: The paranasal sinuses and mastoid air cells are
clear. Globes are intact.

Other: No scalp lesions or hematoma.
IMPRESSION: 1. Advanced cerebral atrophy, ventriculomegaly and periventricular
white matter disease.
2. No acute intracranial findings or mass lesions.
